# Patient Record
Sex: Male | Born: 1960
Health system: Southern US, Community
[De-identification: ages and names within clinical notes are randomized; demographics above are authoritative.]

## PROBLEM LIST (undated history)

## (undated) DIAGNOSIS — J302 Other seasonal allergic rhinitis: Secondary | ICD-10-CM

## (undated) DIAGNOSIS — G4730 Sleep apnea, unspecified: Secondary | ICD-10-CM

## (undated) DIAGNOSIS — N529 Male erectile dysfunction, unspecified: Secondary | ICD-10-CM

## (undated) DIAGNOSIS — E669 Obesity, unspecified: Secondary | ICD-10-CM

## (undated) DIAGNOSIS — J45909 Unspecified asthma, uncomplicated: Secondary | ICD-10-CM

## (undated) DIAGNOSIS — T7840XA Allergy, unspecified, initial encounter: Secondary | ICD-10-CM

## (undated) DIAGNOSIS — K7581 Nonalcoholic steatohepatitis (NASH): Secondary | ICD-10-CM

## (undated) DIAGNOSIS — K5792 Diverticulitis of intestine, part unspecified, without perforation or abscess without bleeding: Secondary | ICD-10-CM

## (undated) DIAGNOSIS — E785 Hyperlipidemia, unspecified: Secondary | ICD-10-CM

## (undated) DIAGNOSIS — I10 Essential (primary) hypertension: Secondary | ICD-10-CM

## (undated) HISTORY — DX: Diverticulitis of intestine, part unspecified, without perforation or abscess without bleeding: K57.92

## (undated) HISTORY — DX: Essential (primary) hypertension: I10

## (undated) HISTORY — DX: Nonalcoholic steatohepatitis (NASH): K75.81

## (undated) HISTORY — DX: Unspecified asthma, uncomplicated: J45.909

## (undated) HISTORY — DX: Allergy, unspecified, initial encounter: T78.40XA

## (undated) HISTORY — DX: Hyperlipidemia, unspecified: E78.5

## (undated) HISTORY — DX: Male erectile dysfunction, unspecified: N52.9

## (undated) HISTORY — DX: Obesity, unspecified: E66.9

## (undated) HISTORY — DX: Other seasonal allergic rhinitis: J30.2

## (undated) HISTORY — PX: COLONOSCOPY: SHX174

## (undated) HISTORY — DX: Sleep apnea, unspecified: G47.30

---

## 1997-07-17 HISTORY — PX: VASECTOMY: SHX75

## 1998-07-17 HISTORY — PX: HERNIA REPAIR: SHX51

## 2000-06-06 ENCOUNTER — Emergency Department (HOSPITAL_COMMUNITY): Admission: EM | Admit: 2000-06-06 | Discharge: 2000-06-07 | Payer: Self-pay | Admitting: Emergency Medicine

## 2002-05-21 ENCOUNTER — Emergency Department (HOSPITAL_COMMUNITY): Admission: EM | Admit: 2002-05-21 | Discharge: 2002-05-21 | Payer: Self-pay | Admitting: Emergency Medicine

## 2004-02-16 ENCOUNTER — Emergency Department (HOSPITAL_COMMUNITY): Admission: EM | Admit: 2004-02-16 | Discharge: 2004-02-16 | Payer: Self-pay | Admitting: Emergency Medicine

## 2004-02-19 ENCOUNTER — Emergency Department (HOSPITAL_COMMUNITY): Admission: EM | Admit: 2004-02-19 | Discharge: 2004-02-19 | Payer: Self-pay | Admitting: Emergency Medicine

## 2004-05-27 ENCOUNTER — Ambulatory Visit: Payer: Self-pay | Admitting: Family Medicine

## 2004-06-24 ENCOUNTER — Ambulatory Visit: Payer: Self-pay | Admitting: Family Medicine

## 2004-08-29 ENCOUNTER — Ambulatory Visit: Payer: Self-pay | Admitting: Family Medicine

## 2004-11-11 ENCOUNTER — Ambulatory Visit: Payer: Self-pay | Admitting: General Surgery

## 2004-12-22 ENCOUNTER — Ambulatory Visit: Payer: Self-pay | Admitting: Family Medicine

## 2005-06-26 ENCOUNTER — Ambulatory Visit: Payer: Self-pay | Admitting: Family Medicine

## 2006-01-03 ENCOUNTER — Ambulatory Visit: Payer: Self-pay | Admitting: Family Medicine

## 2006-07-04 ENCOUNTER — Ambulatory Visit: Payer: Self-pay | Admitting: Family Medicine

## 2007-01-01 ENCOUNTER — Encounter: Payer: Self-pay | Admitting: Internal Medicine

## 2007-01-01 DIAGNOSIS — E1169 Type 2 diabetes mellitus with other specified complication: Secondary | ICD-10-CM | POA: Insufficient documentation

## 2007-01-01 DIAGNOSIS — E785 Hyperlipidemia, unspecified: Secondary | ICD-10-CM | POA: Insufficient documentation

## 2007-01-01 DIAGNOSIS — I10 Essential (primary) hypertension: Secondary | ICD-10-CM | POA: Insufficient documentation

## 2007-01-01 DIAGNOSIS — K5730 Diverticulosis of large intestine without perforation or abscess without bleeding: Secondary | ICD-10-CM | POA: Insufficient documentation

## 2007-01-03 ENCOUNTER — Ambulatory Visit: Payer: Self-pay | Admitting: Family Medicine

## 2007-01-03 DIAGNOSIS — E6609 Other obesity due to excess calories: Secondary | ICD-10-CM | POA: Insufficient documentation

## 2007-01-03 DIAGNOSIS — E669 Obesity, unspecified: Secondary | ICD-10-CM

## 2007-01-03 DIAGNOSIS — E66811 Obesity, class 1: Secondary | ICD-10-CM | POA: Insufficient documentation

## 2007-01-09 LAB — CONVERTED CEMR LAB
Cholesterol: 181 mg/dL (ref 0–200)
Creatinine, Ser: 1 mg/dL (ref 0.4–1.5)
Direct LDL: 100.5 mg/dL
GFR calc non Af Amer: 86 mL/min
Triglycerides: 205 mg/dL (ref 0–149)

## 2007-03-19 ENCOUNTER — Emergency Department (HOSPITAL_COMMUNITY): Admission: EM | Admit: 2007-03-19 | Discharge: 2007-03-19 | Payer: Self-pay | Admitting: Family Medicine

## 2007-07-17 ENCOUNTER — Ambulatory Visit: Payer: Self-pay | Admitting: Family Medicine

## 2007-07-22 LAB — CONVERTED CEMR LAB
ALT: 72 units/L — ABNORMAL HIGH (ref 0–53)
AST: 45 units/L — ABNORMAL HIGH (ref 0–37)
BUN: 12 mg/dL (ref 6–23)
Bilirubin, Direct: 0.2 mg/dL (ref 0.0–0.3)
Calcium: 9.3 mg/dL (ref 8.4–10.5)
Chloride: 103 meq/L (ref 96–112)
Cholesterol: 205 mg/dL (ref 0–200)
HDL: 35.9 mg/dL — ABNORMAL LOW (ref >39.0)
Potassium: 4.3 meq/L (ref 3.5–5.1)
Total Bilirubin: 1.5 mg/dL — ABNORMAL HIGH (ref 0.3–1.2)
Total CHOL/HDL Ratio: 5.7
Triglycerides: 199 mg/dL — ABNORMAL HIGH (ref 0–149)
VLDL: 40 mg/dL (ref 0–40)

## 2007-11-11 ENCOUNTER — Ambulatory Visit: Payer: Self-pay | Admitting: Family Medicine

## 2007-11-12 LAB — CONVERTED CEMR LAB
ALT: 71 units/L — ABNORMAL HIGH (ref 0–53)
Cholesterol: 182 mg/dL (ref 0–200)
HDL: 35.2 mg/dL — ABNORMAL LOW (ref >39.0)
Total CHOL/HDL Ratio: 5.2
Triglycerides: 166 mg/dL — ABNORMAL HIGH (ref 0–149)
VLDL: 33 mg/dL (ref 0–40)

## 2008-01-15 ENCOUNTER — Ambulatory Visit: Payer: Self-pay | Admitting: Family Medicine

## 2008-04-23 ENCOUNTER — Ambulatory Visit: Payer: Self-pay | Admitting: Family Medicine

## 2008-05-04 LAB — CONVERTED CEMR LAB
ALT: 70 units/L — ABNORMAL HIGH (ref 0–53)
AST: 40 units/L — ABNORMAL HIGH (ref 0–37)
Alkaline Phosphatase: 63 units/L (ref 39–117)
Calcium: 8.8 mg/dL (ref 8.4–10.5)
Creatinine, Ser: 1.1 mg/dL (ref 0.4–1.5)
GFR calc Af Amer: 93 mL/min
GFR calc non Af Amer: 77 mL/min
Glucose, Bld: 90 mg/dL (ref 70–99)
Potassium: 3.9 meq/L (ref 3.5–5.1)

## 2008-05-26 ENCOUNTER — Ambulatory Visit: Payer: Self-pay | Admitting: Family Medicine

## 2008-05-26 DIAGNOSIS — F528 Other sexual dysfunction not due to a substance or known physiological condition: Secondary | ICD-10-CM | POA: Insufficient documentation

## 2008-05-26 DIAGNOSIS — N529 Male erectile dysfunction, unspecified: Secondary | ICD-10-CM | POA: Insufficient documentation

## 2008-06-01 ENCOUNTER — Telehealth (INDEPENDENT_AMBULATORY_CARE_PROVIDER_SITE_OTHER): Payer: Self-pay | Admitting: Internal Medicine

## 2008-07-01 ENCOUNTER — Telehealth (INDEPENDENT_AMBULATORY_CARE_PROVIDER_SITE_OTHER): Payer: Self-pay | Admitting: Internal Medicine

## 2008-10-22 ENCOUNTER — Ambulatory Visit: Payer: Self-pay | Admitting: Family Medicine

## 2008-10-23 LAB — CONVERTED CEMR LAB
AST: 28 units/L (ref 0–37)
Albumin: 4.1 g/dL (ref 3.5–5.2)
Alkaline Phosphatase: 63 units/L (ref 39–117)
Bilirubin, Direct: 0.2 mg/dL (ref 0.0–0.3)
Total Bilirubin: 1.3 mg/dL — ABNORMAL HIGH (ref 0.3–1.2)
Total Protein: 7 g/dL (ref 6.0–8.3)

## 2008-12-08 ENCOUNTER — Ambulatory Visit: Payer: Self-pay | Admitting: Family Medicine

## 2008-12-31 ENCOUNTER — Telehealth (INDEPENDENT_AMBULATORY_CARE_PROVIDER_SITE_OTHER): Payer: Self-pay | Admitting: Internal Medicine

## 2009-04-22 ENCOUNTER — Ambulatory Visit: Payer: Self-pay | Admitting: Family Medicine

## 2009-04-27 LAB — CONVERTED CEMR LAB
ALT: 42 units/L (ref 0–53)
AST: 27 units/L (ref 0–37)
Albumin: 3.8 g/dL (ref 3.5–5.2)
Total CHOL/HDL Ratio: 5
Triglycerides: 112 mg/dL (ref 0.0–149.0)
VLDL: 22.4 mg/dL (ref 0.0–40.0)

## 2009-05-14 ENCOUNTER — Ambulatory Visit: Payer: Self-pay | Admitting: Family Medicine

## 2009-05-14 ENCOUNTER — Encounter (INDEPENDENT_AMBULATORY_CARE_PROVIDER_SITE_OTHER): Payer: Self-pay | Admitting: Internal Medicine

## 2009-08-27 ENCOUNTER — Encounter: Payer: Self-pay | Admitting: Cardiovascular Disease

## 2009-08-27 ENCOUNTER — Ambulatory Visit: Payer: Self-pay | Admitting: Diagnostic Radiology

## 2009-08-27 ENCOUNTER — Emergency Department (HOSPITAL_BASED_OUTPATIENT_CLINIC_OR_DEPARTMENT_OTHER): Admission: EM | Admit: 2009-08-27 | Discharge: 2009-08-27 | Payer: Self-pay | Admitting: Emergency Medicine

## 2009-08-30 ENCOUNTER — Encounter: Payer: Self-pay | Admitting: Cardiovascular Disease

## 2009-08-31 ENCOUNTER — Ambulatory Visit: Payer: Self-pay | Admitting: Cardiovascular Disease

## 2009-11-12 ENCOUNTER — Ambulatory Visit: Payer: Self-pay | Admitting: Internal Medicine

## 2009-11-14 LAB — CONVERTED CEMR LAB
CO2: 26 meq/L (ref 19–32)
Cholesterol: 150 mg/dL (ref 0–200)
Creatinine, Ser: 0.9 mg/dL (ref 0.4–1.5)
GFR calc non Af Amer: 95.81 mL/min (ref >60)
LDL Cholesterol: 89 mg/dL (ref 0–99)
PSA: 0.64 ng/mL (ref 0.10–4.00)
Triglycerides: 155 mg/dL — ABNORMAL HIGH (ref 0.0–149.0)
VLDL: 31 mg/dL (ref 0.0–40.0)

## 2009-11-17 ENCOUNTER — Ambulatory Visit: Payer: Self-pay | Admitting: Family Medicine

## 2010-02-28 ENCOUNTER — Ambulatory Visit: Payer: Self-pay | Admitting: Cardiovascular Disease

## 2010-05-19 ENCOUNTER — Ambulatory Visit: Payer: Self-pay | Admitting: Family Medicine

## 2010-05-19 LAB — CONVERTED CEMR LAB
ALT: 75 units/L — ABNORMAL HIGH (ref 0–53)
Basophils Relative: 0.4 % (ref 0.0–3.0)
Chloride: 106 meq/L (ref 96–112)
Cholesterol: 178 mg/dL (ref 0–200)
GFR calc non Af Amer: 75.05 mL/min (ref >60)
LDL Cholesterol: 108 mg/dL — ABNORMAL HIGH (ref 0–99)
Lymphocytes Relative: 28.9 % (ref 12.0–46.0)
Lymphs Abs: 1.9 10*3/uL (ref 0.7–4.0)
MCHC: 34.4 g/dL (ref 30.0–36.0)
MCV: 89 fL (ref 78.0–100.0)
Monocytes Relative: 8.7 % (ref 3.0–12.0)
Neutrophils Relative %: 60.9 % (ref 43.0–77.0)
Platelets: 201 10*3/uL (ref 150.0–400.0)
Potassium: 4 meq/L (ref 3.5–5.1)
RDW: 13.1 % (ref 11.5–14.6)
Sodium: 140 meq/L (ref 135–145)
TSH: 2.04 microintl units/mL (ref 0.35–5.50)
Total Bilirubin: 0.9 mg/dL (ref 0.3–1.2)
Triglycerides: 169 mg/dL — ABNORMAL HIGH (ref 0.0–149.0)
WBC: 6.5 10*3/uL (ref 4.5–10.5)

## 2010-05-23 ENCOUNTER — Ambulatory Visit: Payer: Self-pay | Admitting: Family Medicine

## 2010-05-23 DIAGNOSIS — R7401 Elevation of levels of liver transaminase levels: Secondary | ICD-10-CM | POA: Insufficient documentation

## 2010-05-23 DIAGNOSIS — R7402 Elevation of levels of lactic acid dehydrogenase (LDH): Secondary | ICD-10-CM | POA: Insufficient documentation

## 2010-05-23 DIAGNOSIS — R740 Nonspecific elevation of levels of transaminase and lactic acid dehydrogenase [LDH]: Secondary | ICD-10-CM

## 2010-08-16 NOTE — Assessment & Plan Note (Signed)
Summary: [facility]/NP6   Visit Type:  New Patient Referring Provider:  Wyatt Mage, NP Primary Provider:  Wyatt Mage, NP  CC:  some chest pressure - ?heartburn this past friday..  No sob or edema...  History of Present Illness: Terry Dixon is a 50 year old gentleman with a history of low HDL, obesity, hypertension who presents for evaluation after recent episode of chest pain and an abnormal EKG.  Terry Dixon states that on Friday morning last week he had a sloppy Joe for breakfast at approximately 5 or 6 in the morning. He developed chest pain that lasted approximately 5-6 hours through the course of the morning. After several hours of chest discomfort, he presented to his boss at work and told him about his discomfort. He was referred for an EKG and as this was abnormal was sent to [facility]. There he received an aspirin, was monitored on telemetry 4 hours and after contact was made with our cardiology, followup was made in clinic.  Since his discharge from the emergency room, his had no further episodes of chest pain. 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 He denies shortness of breath, lightheadedness, dizziness.  He is concerned about his blood pressure and takes Prempro in the morning. Part to taking his medication, he states that his blood pressure has been in the 150s over 90s. He has also been having problems with erectile dysfunction and takes Viagra or cialis.   Preventive Screening-Counseling & Management  Alcohol-Tobacco     Alcohol drinks/day: <1     Smoking Status: quit     Packs/Day: 1.0     Year Started: 1985  Caffeine-Diet-Exercise     Caffeine use/day: 2 cups     Does Patient Exercise: no  Current Problems (verified): 1)  Pure Hypercholesterolemia  (ICD-272.0) 2)  Erectile Dysfunction  (ICD-302.72) 3)  Numbness  (ICD-782.0) 4)  Obesity  (ICD-278.00) 5)  Hypertension  (ICD-401.9) 6)   Hyperlipidemia  (ICD-272.4) 7)  Diverticulosis, Colon  (ICD-562.10)  Current Medications (verified): 1)  Altace 10 Mg Caps (Ramipril) .... Take 1 Tablet By Mouth Once A Day 2)  Fish Oil   Caps (Omega-3 Fatty Acids Caps) .Marland Kitchen.. 1200 Mg. Once Daily 3)  One-Daily Multivitamins   Tabs (Multiple Vitamin) .... Daily 4)  Levitra 20 Mg Tabs (Vardenafil Hcl) .Marland Kitchen.. 1 Once Daily As Needed By Mouth 5)  Viagra 100 Mg Tabs (Sildenafil Citrate) .... As Directed  Allergies (verified): No Known Drug Allergies  Past History:  Past Medical History: Last updated: 01/01/2007 Diverticulosis, colon Hyperlipidemia Hypertension  Past Surgical History: Last updated: 01/01/2007 1999 Vasectomy 2000 Hernia repair R & L 4/06  CT abd/pelvis (-) 10/05 Stress test (-)  Family History: Last updated: 01/15/2008 Father: alive 88 HBP, BPH Mother: Alive 28 DM, HBP Siblings: No brothers, 2 sisters 1 with "male problems",                                                     1 L&W HBP:  M side CA:  colon polyps  Social History: Last updated: 05/14/2009 Marital Status:Divorced x 4 years--04/2009 engaged x 6 mo Children: 1 (36), lives with father 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 Former Smoker, age 8 x 1 year Alcohol use-yes 2-3 x's/week Drug use-no Regular exercise-yes, Gym 2 x/week; Mt. biking 1 x/week, aerobics 1 hr.         2 x/week  Risk Factors: Alcohol Use: <1 (08/31/2009) Caffeine Use: 2 cups (08/31/2009) Exercise: no (08/31/2009)  Risk Factors: Smoking Status: quit (08/31/2009) Packs/Day: 1.0 (08/31/2009) Passive Smoke Exposure: no (07/17/2007)  Social History: Smoking Status:  quit Packs/Day:  1.0 Caffeine use/day:  2 cups  Review of Systems       The patient complains of chest pain.  The patient denies anorexia, fever, weight loss, weight gain, vision loss, decreased hearing, hoarseness, syncope, dyspnea on exertion, peripheral  edema, prolonged cough, headaches, hemoptysis, abdominal pain, melena, hematochezia, severe indigestion/heartburn, hematuria, incontinence, genital sores, muscle weakness, suspicious skin lesions, transient blindness, difficulty walking, depression, unusual weight change, abnormal bleeding, enlarged lymph nodes, angioedema, breast masses, and testicular masses.    Vital Signs:  Patient profile:   50 year old male Height:      68.25 inches Weight:      201.50 pounds BMI:     30.52 Pulse rate:   63 / minute Pulse rhythm:   regular BP sitting:   118 / 76  (left arm) Cuff size:   large  Vitals Entered By: Philemon Kingdom (August 31, 2009 3:06 PM)  Physical Exam  General:  well-appearing middle-aged gentleman in no apparent distress, HEENT exam is benign. He is alert and oriented x3. Neck is supple with no JVP or carotid bruits. Heart sounds are regular with normal S1 and S2 and no murmurs appreciated. Lungs are clear to auscultation with no wheezes or rales. Abdominal exam is benign and he has no significant lower extremity edema. Neurologic exam is nonfocal and skin is warm and dry. Pulses are equal and symmetrical in his upper and lower extremities.    EKG  Procedure date:  08/31/2009  Findings:      EKG shows normal sinus rhythm with rate of 59 beats per minute, right bundle branch block.  Impression & Recommendations:  Problem # 1:  CHEST PAIN-UNSPECIFIED (ICD-786.50) Etiology of his chest pain is uncertain. I suspect that it may have been GI in nature as he had just had a sloppy Joe for breakfast. His symptoms are consistent with heartburn/GERD. I have suggested to him that if he continues to have any more episodes of chest discomfort, that he call us. We would perform a stress test at that time. He does have a bundle branch block on EKG today but this by itself has not needed any workup if he is asymptomatic.I have discussed this with him and he will contact us for additional  episodes of chest discomfort.  Problem # 2:  HYPERTENSION (ICD-401.9) Mr. Terry Dixon has hypertension but does not appear to be well controlled on his ReoPro as he states having systolics typically in the 161W with diastolics in the 96E. He also reports having erectile dysfunction. I will change his ACE inhibitor which may contribute to his ED, 2 losartan 100 mg daily. I asked him to continue to monitor his blood pressures for a period His updated medication list for this problem includes:     Cozaar 100 Mg Tabs (Losartan potassium) .Marland Kitchen... 1 tab by mouth daily  Problem # 3:  OBESITY (ICD-278.00) We talked about his weight and is well aware that he needs to eat less and exercise more. Last 10 to start a more rigorous diet plan and increases  exercise if possible but he does have a busy work schedule.  Problem # 4:  HYPERLIPIDEMIA (ICD-272.4) in October 2010, total cholesterol 141, LDL 90 and HDL 29. We suggested the exercise would be one way to increase his HDL. He is noteager to start an additional medication such as Niaspan at this time. Cholesterol can be rechecked on an annual basis.  Patient Instructions: 1)  Your physician recommends that you schedule a follow-up appointment in: 6 months 2)  Your physician recommends that you continue on your current medications as directed. Please refer to the Current Medication list given to you today. Once you are finished with your ramipril, start cozaar 184m daily. Prescriptions: COZAAR 100 MG TABS (LOSARTAN POTASSIUM) 1 tab by mouth daily  #30 x 6   Entered by:   MGabriel Cirri RN, BSN   Authorized by:   TEsmond PlantsMD   Signed by:   MGabriel Cirri RN, BSN on 08/31/2009   Method used:   Electronically to        [facility]  [address] (retail)       [address]      [place]  [id number]      Ph: [phone]      Fax: [phone]  RxID:   [id number]

## 2010-08-16 NOTE — Assessment & Plan Note (Signed)
Summary: F6M/AMD   Visit Type:  Follow-up Referring Provider:  Wyatt Mage, Terry Dixon Primary Provider:  Wyatt Mage, Terry Dixon  CC:  "Doing well.".  History of Present Illness: Terry Dixon is a 50 year old gentleman with a history of low HDL, obesity, hypertension who had epsiodes of  chest pain and an abnormal EKG on his last visit who presents for routine follow up. He was seen previously in the emergency room.  overall, he states that he is doing well. He denies any further episodes of chest discomfort, shortness of breath. He is active at work and has no symptoms of lightheadedness or dizziness. he does not participate in an exercise program and reports that he walks a significant amount at work. He has been taking fish oil.  Recent lipid panel shows total cholesterol of 150, LDL greater than 80.    Current Medications (verified): 1)  Cozaar 100 Mg Tabs (Losartan Potassium) .Marland Kitchen.. 1 Tab By Mouth Daily 2)  Fish Oil   Caps (Omega-3 Fatty Acids Caps) .Marland Kitchen.. 1200 Mg. Once Daily 3)  One-Daily Multivitamins   Tabs (Multiple Vitamin) .... Daily 4)  Levitra 20 Mg Tabs (Vardenafil Hcl) .Marland Kitchen.. 1 Once Daily As Needed By Mouth 5)  Viagra 100 Mg Tabs (Sildenafil Citrate) .... As Directed 6)  Androgel Pump 1 % Gel (Testosterone) .... As Directed  Allergies (verified): No Known Drug Allergies  Past History:  Past Medical History: Last updated: 11/17/2009 Diverticulosis, colon Hyperlipidemia- with low HDL obesity  Hypertension ED low testosterone   [facility]rol- [facility]  Past Surgical History: Last updated: 01/01/2007 1999 Vasectomy 2000 Hernia repair R & L 4/06  CT abd/pelvis (-) 10/05 Stress test (-)  Family History: Last updated: 01/15/2008 Father: alive 61 HBP, BPH Mother: Alive 32 DM, HBP Siblings: No brothers, 2 sisters 1 with "male problems",                                                     1 L&W HBP:  M side CA:  colon polyps  Social History: Last updated:  05/14/2009 Marital Status:Divorced x 4 years--04/2009 engaged x 6 mo Children: 1 (66), lives with father 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 Former Smoker, age 88 x 1 year Alcohol use-yes 2-3 x's/week Drug use-no Regular exercise-yes, Gym 2 x/week; Mt. biking 1 x/week, aerobics 1 hr.         2 x/week  Risk Factors: Alcohol Use: <1 (08/31/2009) Caffeine Use: 2 cups (08/31/2009) Exercise: no (08/31/2009)  Risk Factors: Smoking Status: quit (08/31/2009) Packs/Day: 1.0 (08/31/2009) Passive Smoke Exposure: no (07/17/2007)  Review of Systems  The patient denies fever, weight loss, weight gain, vision loss, decreased hearing, hoarseness, chest pain, syncope, dyspnea on exertion, peripheral edema, prolonged cough, abdominal pain, incontinence, muscle weakness, depression, and enlarged lymph nodes.    Vital Signs:  Patient profile:   50 year old male Height:      68.25 inches Weight:      206 pounds BMI:     31.21 Pulse rate:   56 / minute BP sitting:   130 / 81  (left arm) Cuff size:   regular  Vitals Entered By: Terry Dixon, CMA (February 28, 2010 10:10 AM)  Physical Exam  General:  overweight but generally well appearing  Head:  normocephalic, atraumatic, and no abnormalities observed.   Neck:  supple with full rom and no masses or thyromegally, no JVD or carotid bruit  Lungs:  Normal respiratory effort, chest expands symmetrically. Lungs are clear to auscultation, no crackles or wheezes. Heart:  normal rate, regular rhythm, and no murmur.   Abdomen:  Bowel sounds positive,abdomen soft and non-tender without masses,  Msk:  Back normal, normal gait. Muscle strength and tone normal. Pulses:  pulses normal in all 4 extremities Extremities:  No clubbing or cyanosis. Neurologic:  Alert and oriented x 3. Skin:  Intact without lesions or rashes. Psych:  Normal affect.   Impression & Recommendations:  Problem # 1:   HYPERTENSION (ICD-401.9) blood pressure is well controlled on his current medication regimen. We have made no changes.  His updated medication list for this problem includes:    Cozaar 100 Mg Tabs (Losartan potassium) .Marland Kitchen... 1 tab by mouth daily  Problem # 2:  HYPERLIPIDEMIA (ICD-272.4)  Cholesterol is reasonably well controlled given that he has no underlying coronary artery disease that we know of. No significant family history of coronary artery disease. We will not start him on a statin at this time.  Problem # 3:  OBESITY (ICD-278.00) we have asked him to work on his weight, watch his diet, increase his regular exercise. He can followup on an as needed basis and call us if he has additional episodes of chest pain.  Patient Instructions: 1)  Your physician recommends that you schedule a follow-up appointment in: as needed  2)  Your physician recommends that you continue on your current medications as directed. Please refer to the Current Medication list given to you today.

## 2010-08-16 NOTE — Letter (Signed)
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 [place], [place] [address]   Phone: [phone]  Fax: [phone]     August 31, 2009    Terry Dixon   The above named patient had a medical visit today at:    2:45  pm.  Please take this into consideration when reviewing the time away from work/school.      Sincerely yours,       Press photographer

## 2010-08-16 NOTE — Assessment & Plan Note (Signed)
Summary: 6 m f/u dlo   Vital Signs:  Patient profile:   50 year old male Height:      68.25 inches Weight:      204 pounds BMI:     30.90 Temp:     98 degrees F oral Pulse rate:   60 / minute Pulse rhythm:   regular BP sitting:   116 / 72  (left arm) Cuff size:   large  Vitals Entered By: Ozzie Hoyle LPN (Nov 18, 6267 4:85 AM) CC: six month f/u after labs   History of Present Illness: was seeing Billie Bean   hx of HTN well on cozaar -fine , no problems   hx of high chol-- more of a low HDL  disc with cardiology  used to be a bigger drinker - now has one drink per day  this past HDL was 30  is not exercising  would like to start  will have to start in am   is interested in seeing what thyroid tests show  sees urol - at [facility] every 6 mo  psa normal  has ED , and low testosterone  no prostate problems  is due to f/u soon -- they do his yearly prostate exam   diet is healthy and does not think he eats too much- in fact does not eat enough   Allergies (verified): No Known Drug Allergies  Past History:  Past Surgical History: Last updated: 01/01/2007 1999 Vasectomy 2000 Hernia repair R & L 4/06  CT abd/pelvis (-) 10/05 Stress test (-)  Family History: Last updated: 01/15/2008 Father: alive 7 HBP, BPH Mother: Alive 5 DM, HBP Siblings: No brothers, 2 sisters 1 with "male problems",                                                     1 L&W HBP:  M side CA:  colon polyps  Social History: Last updated: 05/14/2009 Marital Status:Divorced x 4 years--04/2009 engaged x 6 mo Children: 1 (60), lives with father 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 Former Smoker, age 50 x 1 year Alcohol use-yes 2-3 x's/week Drug use-no Regular exercise-yes, Gym 2 x/week; Mt. biking 1 x/week, aerobics 1 hr.         2 x/week  Risk Factors: Alcohol Use: <1 (08/31/2009) Caffeine Use: 2 cups (08/31/2009) Exercise:  no (08/31/2009)  Risk Factors: Smoking Status: quit (08/31/2009) Packs/Day: 1.0 (08/31/2009) Passive Smoke Exposure: no (07/17/2007)  Past Medical History: Diverticulosis, colon Hyperlipidemia- with low HDL obesity  Hypertension ED low testosterone   [facility]rol- [facility]  Review of Systems General:  Denies fatigue, loss of appetite, and malaise. Eyes:  Denies blurring and eye pain. CV:  Denies chest pain or discomfort, palpitations, shortness of breath with exertion, and swelling of feet. Resp:  Denies cough and wheezing. GI:  Denies abdominal pain, bloody stools, change in bowel habits, indigestion, and nausea. GU:  Complains of erectile dysfunction; denies incontinence, nocturia, and urinary frequency. MS:  Denies joint pain, joint redness, joint swelling, and muscle aches. Derm:  Denies itching, lesion(s), poor wound healing, and rash. Neuro:  Denies numbness and tingling. Psych:  Denies anxiety and depression. Endo:  Denies cold intolerance, excessive thirst, excessive urination, and heat intolerance.  Heme:  Denies abnormal bruising and bleeding.  Physical Exam  General:  overweight but generally well appearing  Head:  normocephalic, atraumatic, and no abnormalities observed.   Eyes:  vision grossly intact, pupils equal, pupils round, and pupils reactive to light.  no conjunctival pallor, injection or icterus  Mouth:  pharynx pink and moist.   Neck:  supple with full rom and no masses or thyromegally, no JVD or carotid bruit  Chest Wall:  No deformities, masses, tenderness or gynecomastia noted. Lungs:  Normal respiratory effort, chest expands symmetrically. Lungs are clear to auscultation, no crackles or wheezes. Heart:  normal rate, regular rhythm, and no murmur.   Abdomen:  Bowel sounds positive,abdomen soft and non-tender without masses, organomegaly or hernias noted. no renal bruits protuberant small umbilical hernia  Msk:  No deformity or scoliosis noted of  thoracic or lumbar spine.  no acute joint changes  Pulses:  R and L carotid,radial,femoral,dorsalis pedis and posterior tibial pulses are full and equal bilaterally Extremities:  No clubbing, cyanosis, edema, or deformity noted with normal full range of motion of all joints.   Neurologic:  sensation intact to light touch, gait normal, and DTRs symmetrical and normal.   Skin:  Intact without suspicious lesions or rashes lentigos diffusely  Cervical Nodes:  No lymphadenopathy noted Inguinal Nodes:  No significant adenopathy Psych:  normal affect, talkative and pleasant    Impression & Recommendations:  Problem # 1:  SPECIAL SCREENING MALIGNANT NEOPLASM OF PROSTATE (ICD-V76.44) Assessment Unchanged psa was normal - with no hx of problems- will send along to his urologist (in fact- pt will take his own copy) rev labs with him  Problem # 2:  PURE HYPERCHOLESTEROLEMIA (ICD-272.0) Assessment: Unchanged with low hdl- rev labs in detail with pt  recommended exercise continue omega fish oil if not better in 6 mo - consider niaspan   Problem # 3:  HYPERTENSION (ICD-401.9) Assessment: Unchanged  good control with cozaar and healthy diet asked to add exercise and wt loss plan  lab and PE planned for 6 mo  His updated medication list for this problem includes:    Cozaar 100 Mg Tabs (Losartan potassium) .Marland Kitchen... 1 tab by mouth daily  BP today: 116/72 Prior BP: 118/76 (08/31/2009)  Labs Reviewed: K+: 3.9 (11/12/2009) Creat: : 0.9 (11/12/2009)   Chol: 150 (11/12/2009)   HDL: 30.00 (11/12/2009)   LDL: 89 (11/12/2009)   TG: 155.0 (11/12/2009)  Problem # 4:  ERECTILE DYSFUNCTION (ICD-302.72) Assessment: Unchanged will f/u for this and testosterone def with his urologist  would like note sent when he goes  His updated medication list for this problem includes:    Levitra 20 Mg Tabs (Vardenafil hcl) .Marland Kitchen... 1 once daily as needed by mouth    Viagra 100 Mg Tabs (Sildenafil citrate) .Marland Kitchen... As  directed  Complete Medication List: 1)  Cozaar 100 Mg Tabs (Losartan potassium) .Marland Kitchen.. 1 tab by mouth daily 2)  Fish Oil Caps (Omega-3 fatty acids caps) .Marland Kitchen.. 1200 mg. once daily 3)  One-daily Multivitamins Tabs (Multiple vitamin) .... Daily 4)  Levitra 20 Mg Tabs (Vardenafil hcl) .Marland Kitchen.. 1 once daily as needed by mouth 5)  Viagra 100 Mg Tabs (Sildenafil citrate) .... As directed 6)  Androgel Pump 1 % Gel (Testosterone) .... As directed   Patient Instructions: 1)  start exercising in am from 4:30 - 5 -- walking in good weather or exercise with TV video in bad weather  2)  this will help the HDL  3)  continue  the fish oil  4)  schedule fasting labs and then PE in 6 months -- wellness/ lipids v70.0, 272   Current Allergies (reviewed today): No known allergies

## 2010-08-16 NOTE — Progress Notes (Signed)
Summary: PHI  PHI   Imported By: Zenovia Jarred 09/01/2009 14:23:38  _____________________________________________________________________  External Attachment:    Type:   Image     Comment:   External Document

## 2010-08-16 NOTE — Assessment & Plan Note (Signed)
Summary: CPX/RBH   Vital Signs:  Patient profile:   50 year old male Height:      68.25 inches Weight:      208.25 pounds BMI:     31.55 Temp:     98.2 degrees F oral Pulse rate:   60 / minute Pulse rhythm:   regular BP sitting:   144 / 84  (left arm) Cuff size:   regular  Vitals Entered By: Ozzie Hoyle LPN (May 23, 3418 9:05 AM)  Serial Vital Signs/Assessments:  Time      Position  BP       Pulse  Resp  Temp     By                     130/82                         Allena Earing MD  CC: CPX   History of Present Illness: here for wellness exam and to disc chronic med problems   feeling ok in general  L hip bothering him for 4 months -- worse after driving and he tries to get out of truck  eases after waking again  is in groin area   no regular exercise  is active job   is on his feet 8 of his 12 hour shift  does climb a lot of stairs   lipids are up a bit with trig 169 and HDL 36 and LDL 108 (was in 80s) also ast /alt are slt elevated at 43 and 75-- these have been up and down in past  does not take any type of otc pain meds  alcohol intake one drink per month (used to drink more)  drinks a lot of mt dew -- one to four per day  drinks a lot of water at work   HTN has been controlled  144/84 today-- will re check   wt is up 2 lb diet is average -- tries to watch what he eats  does stay away from the fatty and fried food - eats fast food once per week-- burger and fries    last psa nl in april saw urologist about testosterone and ED sees urol on 22 -- nothing new  levitra is working well for him  testosterone is helping some - his levels are nl   Tdap 10/10 flu shot   Allergies (verified): No Known Drug Allergies  Past History:  Past Medical History: Last updated: 11/17/2009 Diverticulosis, colon Hyperlipidemia- with low HDL obesity  Hypertension ED low testosterone   urol- [facility]  Past Surgical History: Last updated:  01/01/2007 1999 Vasectomy 2000 Hernia repair R & L 4/06  CT abd/pelvis (-) 10/05 Stress test (-)  Family History: Last updated: 01/15/2008 Father: alive 32 HBP, BPH Mother: Alive 3 DM, HBP Siblings: No brothers, 2 sisters 1 with "male problems",                                                     1 L&W HBP:  M side CA:  colon polyps  Social History: Last updated: 05/23/2010 Marital Status:Divorced x 4 years--04/2009 engaged x 6 mo Children: 1 (42), lives with father 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 Former Smoker, age 69 x 1 year Alcohol use-yes - 1 drink per mo (in past more )  Drug use-no Regular exercise-yes, Gym 2 x/week; Mt. biking 1 x/week, aerobics 1 hr.         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 Former Smoker, age 69 x 1 year Alcohol use-yes - 1 drink per mo (in past more )  Drug use-no Regular exercise-yes, Gym 2 x/week; Mt. biking 1 x/week, aerobics 1 hr.         2 x/week  Review of Systems General:  Denies fatigue, loss of appetite, and malaise. Eyes:  Denies blurring and eye irritation. CV:  Denies chest pain or discomfort, lightheadness, and palpitations. Resp:  Denies cough, shortness of breath, and wheezing. GI:  Denies abdominal pain, change in bowel habits, indigestion, and nausea. GU:  Complains of erectile dysfunction; denies hematuria and urinary frequency. MS:  Denies joint pain, joint redness, and joint swelling. Derm:  Denies itching, lesion(s), poor wound healing, and rash. Neuro:  Denies  numbness and tingling. Psych:  Denies anxiety and depression. Endo:  Denies cold intolerance, excessive thirst, excessive urination, and heat intolerance. Heme:  Denies abnormal bruising and bleeding.  Physical Exam  General:  overweight but generally well appearing  Head:  normocephalic, atraumatic, no abnormalities observed, and no abnormalities palpated.  no conjunctival pallor, injection or icterus  Eyes:  vision grossly intact, pupils equal, pupils round, and pupils reactive to light.  no conjunctival pallor, injection or icterus  Ears:  R ear normal and L ear normal.  - scant cerumen  Nose:  no nasal discharge.   Mouth:  pharynx pink and moist.   Neck:  supple with full rom and no masses or thyromegally, no JVD or carotid bruit  Chest Wall:  No deformities, masses, tenderness or gynecomastia noted. Lungs:  Normal respiratory effort, chest expands symmetrically. Lungs are clear to auscultation, no crackles or wheezes. Heart:  normal rate, regular rhythm, and no murmur.   Abdomen:  Bowel sounds positive,abdomen soft and non-tender without masses, organomegaly or hernias noted. no renal bruits protuberant small umbilical hernia  Rectal:  not done Msk:  No deformity or scoliosis noted of thoracic or lumbar spine.  no acute joint changes  Pulses:  R and L carotid,radial,femoral,dorsalis pedis and posterior tibial pulses are full and equal bilaterally Extremities:  No clubbing, cyanosis, edema, or deformity noted with normal full range of motion of all joints.   Neurologic:  sensation intact to light touch, gait normal, and DTRs symmetrical and normal.   Skin:  Intact without suspicious lesions or rashes Cervical Nodes:  No lymphadenopathy noted Inguinal Nodes:  No significant adenopathy Psych:  normal affect, talkative and pleasant    Impression & Recommendations:  Problem # 1:  HEALTH MAINTENANCE EXAM (ICD-V70.0) Assessment Comment Only reviewed health habits including diet,  exercise and skin cancer prevention reviewed health maintenance list and family history will f/u with urol for prostate exam next wk  rev lab in detail  disc imp ov wt loss  Problem # 2:  PURE  HYPERCHOLESTEROLEMIA (ICD-272.0) Assessment: Deteriorated  rev labs need to inc hdl-- given info  disc exercise  re check 6 mo and f/u  Labs Reviewed: SGOT: 43 (05/19/2010)   SGPT: 75 (05/19/2010)   HDL:36.10 (05/19/2010), 30.00 (11/12/2009)  LDL:108 (05/19/2010), 89 (11/12/2009)  Chol:178 (05/19/2010), 150 (11/12/2009)  Trig:169.0 (05/19/2010), 155.0 (11/12/2009)  Problem # 3:  HYPERTENSION (ICD-401.9) Assessment: Unchanged  this is well controlled on cozaar without change rev labs  plan made for wt loss His updated medication list for this problem includes:    Cozaar 100 Mg Tabs (Losartan potassium) .Marland Kitchen... 1 tab by mouth daily  BP today: 144/84- re check 130/82 at rest  Prior BP: 130/81 (02/28/2010)  Labs Reviewed: K+: 4.0 (05/19/2010) Creat: : 1.1 (05/19/2010)   Chol: 178 (05/19/2010)   HDL: 36.10 (05/19/2010)   LDL: 108 (05/19/2010)   TG: 169.0 (05/19/2010)  Problem # 4:  TRANSAMINASES, SERUM, ELEVATED (ICD-790.4) mild / intermittent and most likely from fatty liver  will work on wt loss- made plan  f/u 6 mo after labs  Complete Medication List: 1)  Cozaar 100 Mg Tabs (Losartan potassium) .Marland Kitchen.. 1 tab by mouth daily 2)  Fish Oil Caps (Omega-3 fatty acids caps) .Marland Kitchen.. 1200 mg. once daily 3)  One-daily Multivitamins Tabs (Multiple vitamin) .... Daily 4)  Levitra 20 Mg Tabs (Vardenafil hcl) .Marland Kitchen.. 1 once daily as needed by mouth 5)  Viagra 100 Mg Tabs (Sildenafil citrate) .... As directed 6)  Androgel Pump 1 % Gel (Testosterone) .... As directed  Other Orders: Admin 1st Vaccine [phone]) Flu Vaccine 5yr + (([id number]  Patient Instructions: 1)  It is important that you exercise reguarly at least 20 minutes 5 times a week. If you develop chest pain, have severe difficulty breathing, or  feel very tired, stop exercising immediately and seek medical attention.  2)  try to go back to the good routine with fruits and vegetables  3)  work on 1 lb per week weight loss 4)  no change in medicine  5)  schedule fasting lab and then follow up in 6 month lipid/hepatic 272   Orders Added: 1)  Admin 1st Vaccine [[id number]] 2)  Flu Vaccine 315yr+ [90658] 3)  Est. Patient 40-64 years [[id number]]  Current Allergies (reviewed today): No known allergies   Flu Vaccine Consent Questions     Do you have a history of severe allergic reactions to this vaccine? no    Any prior history of allergic reactions to egg and/or gelatin? no    Do you have a sensitivity to the preservative Thimersol? no    Do you have a past history of Guillan-Barre Syndrome? no    Do you currently have an acute febrile illness? no    Have you ever had a severe reaction to latex? no    Vaccine information given and explained to patient? yes    Are you currently pregnant? no    Lot Number:[id number]   Exp Date:01/14/2011   Site Given  Left Deltoid IM ReOzzie HoylePN  November  7, 209147:55 AM  .lbElana Alm

## 2010-09-24 ENCOUNTER — Encounter: Payer: Self-pay | Admitting: Family Medicine

## 2010-10-06 ENCOUNTER — Other Ambulatory Visit: Payer: Self-pay | Admitting: Emergency Medicine

## 2010-10-06 LAB — COMPREHENSIVE METABOLIC PANEL
ALT: 69 U/L — ABNORMAL HIGH (ref 0–53)
AST: 37 U/L (ref 0–37)
Alkaline Phosphatase: 71 U/L (ref 39–117)
CO2: 27 mEq/L (ref 19–32)
GFR calc non Af Amer: >60 mL/min (ref >60)
Total Protein: 7.6 g/dL (ref 6.0–8.3)

## 2010-10-06 LAB — POCT CARDIAC MARKERS
CKMB, poc: <1 ng/mL — ABNORMAL LOW (ref 1.0–8.0)
Myoglobin, poc: 44.7 ng/mL (ref 12–200)
Myoglobin, poc: 55.4 ng/mL (ref 12–200)
Troponin i, poc: <0.05 ng/mL (ref 0.00–0.09)

## 2010-10-06 LAB — CBC
Hemoglobin: 16.3 g/dL (ref 13.0–17.0)
MCHC: 34.8 g/dL (ref 30.0–36.0)
MCV: 87.8 fL (ref 78.0–100.0)
Platelets: 217 10*3/uL (ref 150–400)
RBC: 5.33 MIL/uL (ref 4.22–5.81)
RDW: 12.1 % (ref 11.5–15.5)
WBC: 8 10*3/uL (ref 4.0–10.5)

## 2010-10-06 LAB — DIFFERENTIAL
Lymphocytes Relative: 19 % (ref 12–46)
Lymphs Abs: 1.6 10*3/uL (ref 0.7–4.0)
Monocytes Absolute: 0.5 10*3/uL (ref 0.1–1.0)
Monocytes Relative: 7 % (ref 3–12)
Neutro Abs: 5.8 10*3/uL (ref 1.7–7.7)
Neutrophils Relative %: 72 % (ref 43–77)

## 2010-10-06 MED ORDER — LOSARTAN POTASSIUM 100 MG PO TABS: 100.0000 mg | ORAL_TABLET | Freq: Every day | ORAL | Status: DC

## 2010-10-06 NOTE — Telephone Encounter (Signed)
rx sent into pharmacy/sab

## 2010-11-14 ENCOUNTER — Other Ambulatory Visit: Payer: Self-pay | Admitting: Family Medicine

## 2010-11-14 DIAGNOSIS — E785 Hyperlipidemia, unspecified: Secondary | ICD-10-CM

## 2010-11-17 ENCOUNTER — Other Ambulatory Visit (INDEPENDENT_AMBULATORY_CARE_PROVIDER_SITE_OTHER): Payer: BC Managed Care – PPO

## 2010-11-17 DIAGNOSIS — E785 Hyperlipidemia, unspecified: Secondary | ICD-10-CM

## 2010-11-17 LAB — HEPATIC FUNCTION PANEL
ALT: 87 U/L — ABNORMAL HIGH (ref 0–53)
AST: 50 U/L — ABNORMAL HIGH (ref 0–37)
Alkaline Phosphatase: 63 U/L (ref 39–117)
Bilirubin, Direct: 0.1 mg/dL (ref 0.0–0.3)
Total Bilirubin: 1.3 mg/dL — ABNORMAL HIGH (ref 0.3–1.2)

## 2010-11-17 LAB — LDL CHOLESTEROL, DIRECT: Direct LDL: 115.6 mg/dL

## 2010-11-17 LAB — LIPID PANEL
Cholesterol: 182 mg/dL (ref 0–200)
Total CHOL/HDL Ratio: 5
Triglycerides: 201 mg/dL — ABNORMAL HIGH (ref 0.0–149.0)
VLDL: 40.2 mg/dL — ABNORMAL HIGH (ref 0.0–40.0)

## 2010-11-21 ENCOUNTER — Encounter: Payer: Self-pay | Admitting: Family Medicine

## 2010-11-21 ENCOUNTER — Ambulatory Visit (INDEPENDENT_AMBULATORY_CARE_PROVIDER_SITE_OTHER): Payer: BC Managed Care – PPO | Admitting: Family Medicine

## 2010-11-21 VITALS — BP 124/76 | HR 60 | Temp 97.9°F | Ht 68.25 in | Wt 207.8 lb

## 2010-11-21 DIAGNOSIS — E785 Hyperlipidemia, unspecified: Secondary | ICD-10-CM

## 2010-11-21 DIAGNOSIS — E669 Obesity, unspecified: Secondary | ICD-10-CM

## 2010-11-21 DIAGNOSIS — R7401 Elevation of levels of liver transaminase levels: Secondary | ICD-10-CM

## 2010-11-21 DIAGNOSIS — I10 Essential (primary) hypertension: Secondary | ICD-10-CM

## 2010-11-21 NOTE — Assessment & Plan Note (Signed)
This is slt imp with LDL but up on trig  Disc low sat fat diet in detail Will continue to monitor  Wt loss is also a goal

## 2010-11-21 NOTE — Assessment & Plan Note (Signed)
Given hx strongly suspect fatty liver Check [place] abd to r/o other processes  No symptoms  Will avoid tylenol and alcohol  Work hard on wt loss F/u 6 wk after [place]

## 2010-11-21 NOTE — Progress Notes (Signed)
Subjective:    Patient ID: Charlann Noss, male    DOB: 05/27/1961, 50 y.o.   MRN: [id number]  HPI Here for f/u of lipids and HTN and elevated LFTs Is feeling fine overall Nothing new going on   HTN in good control with 124/76 No cp or sob or edema  Tolerates cozaar well   Lipids are slt improved with hdl of 36 and LDL 115 Trig are up  Diet is pretty good but does not exercise -- cannot get to the gym -- time wise with scattered schedule  Is avoiding fatty foods    Wt is stable- obese  LFT slt worse with 1.3 bili and ast 50 alt 87-- this is up Never done [place] of liver   Tylenol - does not take   Supplements-- mvi and fish oil  Alcohol --not much  1 beer per week  Weekend before this did drink 12 beers on fishing trip   No abd pain  Never had gallstone problems  No hx of hepatitis  No blood transfusions   Was told he had fatty liver in the past   Past Medical History  Diagnosis Date  . Diverticulitis     colon   . Hyperlipidemia   . Obesity   . Hypertension   . ED (erectile dysfunction)   . Low testosterone     History   Social History  . Marital Status: Divorced    Spouse Name: N/A    Number of Children: 1  . Years of Education: N/A   Occupational History  . [employer] (electrician)    Social History Main Topics  . Smoking status: Never Smoker   . Smokeless tobacco: Not on file   Comment: age 28 for 1 year   . Alcohol Use: Yes     1 drink per month   . Drug Use: No  . Sexually Active: Not on file   Other Topics Concern  . Not on file   Social History Narrative   Regular exercise- yes, gym 2 x week, mnt. Biking 1 x week, aerobics 1 hr 2 x week.     Family History  Problem Relation Age of Onset  . Diabetes Mother   . Benign prostatic hyperplasia Father   . Hypertension Father       Review of Systems Review of Systems  Constitutional: Negative for fever, appetite change, fatigue and unexpected weight change.  Eyes: Negative for pain  and visual disturbance.  Respiratory: Negative for cough and shortness of breath.   Cardiovascular: Negative.   Gastrointestinal: Negative for nausea, diarrhea and constipation.  Genitourinary: Negative for urgency and frequency.  Skin: Negative for pallor.  Neurological: Negative for weakness, light-headedness, numbness and headaches.  Hematological: Negative for adenopathy. Does not bruise/bleed easily.  Psychiatric/Behavioral: Negative for dysphoric mood. The patient is not nervous/anxious.          Objective:   Physical Exam  Constitutional: He appears well-developed and well-nourished. No distress.       overwt and well appearing   HENT:  Head: Normocephalic and atraumatic.  Mouth/Throat: Oropharynx is clear and moist.  Eyes: Conjunctivae and EOM are normal. Pupils are equal, round, and reactive to light.  Neck: Normal range of motion. Neck supple. No JVD present. No thyromegaly present.  Cardiovascular: Normal rate and regular rhythm.   No murmur heard. Pulmonary/Chest: Effort normal and breath sounds normal. No respiratory distress. He exhibits no tenderness.  Abdominal: Soft. Bowel sounds are normal. He  exhibits no distension and no mass. There is no tenderness. There is no rebound.       No hsm  Musculoskeletal: Normal range of motion. He exhibits no edema and no tenderness.  Lymphadenopathy:    He has no cervical adenopathy.  Neurological: He is alert. He has normal reflexes. Coordination normal.  Skin: Skin is warm and dry. No rash noted. No erythema. No pallor.  Psychiatric: He has a normal mood and affect.          Assessment & Plan:

## 2010-11-21 NOTE — Patient Instructions (Addendum)
We will refer you for ultrasound of the liver  Eat more fruit and veg and protein that is lean Aim for 30 minutes of exercise 5 days per week  Follow up about 6 weeks after liver ultraound  Avoid tylenol over the counter and alcohol

## 2010-11-21 NOTE — Assessment & Plan Note (Signed)
Good control - no change in med  Disc wt loss and need for exercise

## 2010-11-21 NOTE — Assessment & Plan Note (Signed)
Likely fatty liver - so  More of an issue now  Disc change to lean protein and fruit and veg  Exercise 5 d per week  F/u 6 wk after abd [place]

## 2010-11-25 ENCOUNTER — Ambulatory Visit
Admission: RE | Admit: 2010-11-25 | Discharge: 2010-11-25 | Disposition: A | Discharge status: Home / Self Care | Payer: BC Managed Care – PPO | Source: Ambulatory Visit | Attending: Family Medicine | Admitting: Family Medicine

## 2010-11-25 DIAGNOSIS — R7401 Elevation of levels of liver transaminase levels: Secondary | ICD-10-CM

## 2010-12-01 ENCOUNTER — Telehealth: Payer: Self-pay

## 2010-12-01 NOTE — Telephone Encounter (Signed)
Patient notified as instructed by telephone.Pt already has f/u appt scheduled.

## 2010-12-01 NOTE — Telephone Encounter (Signed)
Message copied by Ozzie Hoyle on Thu Dec 01, 2010  6:49 PM ------      Message from: Loura Pardon      Created: Sun Nov 27, 2010  5:49 PM       Ultrasound confirms fatty liver disease as we suspected       Work on healthy diet/exercise and weight loss as we disc      Will disc further at f/u

## 2011-01-25 ENCOUNTER — Ambulatory Visit: Payer: BC Managed Care – PPO | Admitting: Family Medicine

## 2011-02-10 ENCOUNTER — Ambulatory Visit: Payer: BC Managed Care – PPO | Admitting: Family Medicine

## 2011-04-24 ENCOUNTER — Telehealth: Payer: Self-pay

## 2011-04-24 MED ORDER — LOSARTAN POTASSIUM 100 MG PO TABS: 100.0000 mg | ORAL_TABLET | Freq: Every day | ORAL | Status: DC

## 2011-04-24 NOTE — Telephone Encounter (Signed)
Refill sent for losartan potassium 100 mg one tablet daily.

## 2011-05-21 ENCOUNTER — Telehealth: Payer: Self-pay | Admitting: Family Medicine

## 2011-05-21 DIAGNOSIS — E785 Hyperlipidemia, unspecified: Secondary | ICD-10-CM

## 2011-05-21 DIAGNOSIS — R7401 Elevation of levels of liver transaminase levels: Secondary | ICD-10-CM

## 2011-05-21 DIAGNOSIS — I10 Essential (primary) hypertension: Secondary | ICD-10-CM

## 2011-05-21 NOTE — Telephone Encounter (Signed)
Message copied by Abner Greenspan on Sun May 21, 2011  8:31 PM ------      Message from: Ellamae Sia      Created: Fri May 19, 2011  3:31 PM      Regarding: Labs for [employer] for f/u

## 2011-05-22 ENCOUNTER — Other Ambulatory Visit (INDEPENDENT_AMBULATORY_CARE_PROVIDER_SITE_OTHER): Payer: BC Managed Care – PPO

## 2011-05-22 ENCOUNTER — Telehealth: Payer: Self-pay | Admitting: Family Medicine

## 2011-05-22 DIAGNOSIS — Z8349 Family history of other endocrine, nutritional and metabolic diseases: Secondary | ICD-10-CM

## 2011-05-22 DIAGNOSIS — E785 Hyperlipidemia, unspecified: Secondary | ICD-10-CM

## 2011-05-22 DIAGNOSIS — I10 Essential (primary) hypertension: Secondary | ICD-10-CM

## 2011-05-22 DIAGNOSIS — Z8489 Family history of other specified conditions: Secondary | ICD-10-CM

## 2011-05-22 DIAGNOSIS — R7401 Elevation of levels of liver transaminase levels: Secondary | ICD-10-CM

## 2011-05-22 HISTORY — DX: Family history of other endocrine, nutritional and metabolic diseases: Z83.49

## 2011-05-22 LAB — RENAL FUNCTION PANEL
Chloride: 109 mEq/L (ref 96–112)
GFR: 71.03 mL/min (ref >60.00)
Glucose, Bld: 91 mg/dL (ref 70–99)
Phosphorus: 2.9 mg/dL (ref 2.3–4.6)
Potassium: 4.1 mEq/L (ref 3.5–5.1)
Sodium: 143 mEq/L (ref 135–145)

## 2011-05-22 LAB — LIPID PANEL
Total CHOL/HDL Ratio: 4
VLDL: 37.8 mg/dL (ref 0.0–40.0)

## 2011-05-22 LAB — HEPATIC FUNCTION PANEL
ALT: 78 U/L — ABNORMAL HIGH (ref 0–53)
Albumin: 4.2 g/dL (ref 3.5–5.2)
Total Protein: 7.1 g/dL (ref 6.0–8.3)

## 2011-05-22 LAB — TSH: TSH: 2.33 u[IU]/mL (ref 0.35–5.50)

## 2011-05-22 LAB — VITAMIN B12: Vitamin B-12: 347 pg/mL (ref 211–911)

## 2011-05-22 NOTE — Progress Notes (Signed)
Addended by: Ellamae Sia on: 05/22/2011 09:57 AM   Modules accepted: Orders

## 2011-05-22 NOTE — Telephone Encounter (Signed)
Message copied by Abner Greenspan on Mon May 22, 2011  9:46 AM ------      Message from: Marchia Bond      Created: Mon May 22, 2011  8:27 AM      Regarding: Add b12 level       Pt wants to add b12 to labs today, he denies fatigue but says family have had issues with low b12.

## 2011-05-22 NOTE — Telephone Encounter (Signed)
done

## 2011-05-26 ENCOUNTER — Ambulatory Visit (INDEPENDENT_AMBULATORY_CARE_PROVIDER_SITE_OTHER): Payer: BC Managed Care – PPO | Admitting: Family Medicine

## 2011-05-26 ENCOUNTER — Encounter: Payer: Self-pay | Admitting: Family Medicine

## 2011-05-26 VITALS — BP 124/76 | HR 64 | Temp 98.0°F | Ht 68.25 in | Wt 207.8 lb

## 2011-05-26 DIAGNOSIS — E785 Hyperlipidemia, unspecified: Secondary | ICD-10-CM

## 2011-05-26 DIAGNOSIS — Z8349 Family history of other endocrine, nutritional and metabolic diseases: Secondary | ICD-10-CM

## 2011-05-26 DIAGNOSIS — Z8489 Family history of other specified conditions: Secondary | ICD-10-CM

## 2011-05-26 DIAGNOSIS — R7401 Elevation of levels of liver transaminase levels: Secondary | ICD-10-CM

## 2011-05-26 DIAGNOSIS — I10 Essential (primary) hypertension: Secondary | ICD-10-CM

## 2011-05-26 DIAGNOSIS — Z23 Encounter for immunization: Secondary | ICD-10-CM

## 2011-05-26 DIAGNOSIS — E669 Obesity, unspecified: Secondary | ICD-10-CM

## 2011-05-26 NOTE — Assessment & Plan Note (Signed)
bp in fair control at this time  No changes needed  Disc lifstyle change with low sodium diet and exercise   

## 2011-05-26 NOTE — Assessment & Plan Note (Signed)
Talked about getting down to bmi of 27 or below to help fatty liver With diet and exercise

## 2011-05-26 NOTE — Progress Notes (Signed)
Subjective:    Patient ID: Charlann Noss, male    DOB: [birth year], 50 y.o.   MRN: [id number]  HPI Here for f/u of fatty liver/ inc transaminases/ HTN / lipids  Diet is improved - proud of that  He researched the "[employer]" Less meat overall  Has cut down soda a lot and stopped alcohol  Is cutting fatty foods  Does unfortunately eat a [employer]  Drinks almond milk -- allergic to dairy -- makes his allergies bad   Mother died at easter -- DM and 40 years old  This was difficult for him He also works night shift- hard to organize meals  bp is 124/76 No ha or palpitations or edema  No change in med  Wt is stable  bmi is 31 Knows he needs to loose wt - disc bmi  Wants to get down to 180  Lipids- trig down some 189 Lab Results  Component Value Date   CHOL 172 05/22/2011   CHOL 182 11/17/2010   CHOL 178 05/19/2010   Lab Results  Component Value Date   HDL 40.00 05/22/2011   HDL 35.10* 11/17/2010   HDL 36.10* 05/19/2010   Lab Results  Component Value Date   LDLCALC 94 05/22/2011   LDLCALC 108* 05/19/2010   LDLCALC 89 11/12/2009   Lab Results  Component Value Date   TRIG 189.0* 05/22/2011   TRIG 201.0* 11/17/2010   TRIG 169.0* 05/19/2010   Lab Results  Component Value Date   CHOLHDL 4 05/22/2011   CHOLHDL 5 11/17/2010   CHOLHDL 5 05/19/2010   Lab Results  Component Value Date   LDLDIRECT 115.6 11/17/2010   LDLDIRECT 121.8 07/17/2007   LDLDIRECT 100.5 01/03/2007     Liver tests slt imp ast 42 from 50  Alt 78 from 87 Diet -- improved  US showed fatty liver   Family has hx of B12 deficiency  His is ok today Fairly balanced diet   Patient Active Problem List  Diagnoses  . HYPERLIPIDEMIA  . OBESITY  . ERECTILE DYSFUNCTION  . HYPERTENSION  . DIVERTICULOSIS, COLON  . TRANSAMINASES, SERUM, ELEVATED  . Family history of non-anemic vitamin B12 deficiency   Past Medical History  Diagnosis Date  . Diverticulitis     colon   . Hyperlipidemia   . Obesity     . Hypertension   . ED (erectile dysfunction)   . Low testosterone    Past Surgical History  Procedure Date  . Vasectomy 1999  . Hernia repair 2000    R & L   History  Substance Use Topics  . Smoking status: Never Smoker   . Smokeless tobacco: Not on file   Comment: age 30 for 1 year   . Alcohol Use: Yes     1 drink per month    Family History  Problem Relation Age of Onset  . Diabetes Mother   . Benign prostatic hyperplasia Father   . Hypertension Father    No Known Allergies Current Outpatient Prescriptions on File Prior to Visit  Medication Sig Dispense Refill  . losartan (COZAAR) 100 MG tablet Take 1 tablet (100 mg total) by mouth daily.  30 tablet  6  . Multiple Vitamin (DAILY MULTIVITAMIN PO) Take by mouth daily.        . Omega-3 Fatty Acids (FISH OIL) 1200 MG CAPS Take by mouth daily.        . Testosterone (ANDROGEL PUMP) 1.25 GM/ACT (1%) GEL Place onto the skin  as directed.        . sildenafil (VIAGRA) 100 MG tablet Take 100 mg by mouth as directed.        . vardenafil (LEVITRA) 20 MG tablet Take 20 mg by mouth daily as needed.           Review of Systems Review of Systems  Constitutional: Negative for fever, appetite change, and unexpected weight change. pos for fatigue at times due to hectic schedule  Eyes: Negative for pain and visual disturbance.  Respiratory: Negative for cough and shortness of breath.   Cardiovascular: Negative for cp or palpitations    Gastrointestinal: Negative for nausea, diarrhea and constipation.  Genitourinary: Negative for urgency and frequency.  Skin: Negative for pallor or rash   Neurological: Negative for weakness, light-headedness, numbness and headaches.  Hematological: Negative for adenopathy. Does not bruise/bleed easily.  Psychiatric/Behavioral: Negative for dysphoric mood. The patient is not nervous/anxious.          Objective:   Physical Exam  Constitutional: He appears well-developed and well-nourished. No  distress.       overwt and well appearing   HENT:  Head: Normocephalic and atraumatic.  Mouth/Throat: Oropharynx is clear and moist.  Eyes: Conjunctivae and EOM are normal. Pupils are equal, round, and reactive to light. No scleral icterus.  Neck: Normal range of motion. Neck supple. No JVD present. Carotid bruit is not present. No thyromegaly present.  Cardiovascular: Normal rate, regular rhythm, normal heart sounds and intact distal pulses.  Exam reveals no gallop.   Pulmonary/Chest: Effort normal and breath sounds normal. No respiratory distress. He has no wheezes.  Abdominal: Soft. Bowel sounds are normal. He exhibits no distension, no abdominal bruit and no mass. There is no tenderness.  Musculoskeletal: Normal range of motion. He exhibits no edema and no tenderness.  Lymphadenopathy:    He has no cervical adenopathy.  Neurological: He is alert. He has normal reflexes. No cranial nerve deficit. He exhibits normal muscle tone. Coordination normal.  Skin: Skin is warm and dry. No rash noted. No erythema. No pallor.  Psychiatric: He has a normal mood and affect.          Assessment & Plan:

## 2011-05-26 NOTE — Assessment & Plan Note (Signed)
His B12 normal  Disc balanced diet

## 2011-05-26 NOTE — Patient Instructions (Addendum)
Avoid red meat/ fried foods/ egg yolks/ fatty breakfast meats/ butter, cheese and high fat dairy/ and shellfish   Eat less/ and try to burn more calories with exercise to loose weight Goal is 5 days per week of 20-30 minutes of aerobic exercise As you loose weight - I think your liver condition will improve more as will your cholesterol  Schedule PE in 6 months with labs prior

## 2011-05-26 NOTE — Assessment & Plan Note (Signed)
abd US shows fatty liver- causing inc transaminases  No alcohol and cutting fats in diet  Disc exercise No pain or symptoms  Will re check in 6 months and then follow up

## 2011-09-08 ENCOUNTER — Other Ambulatory Visit: Payer: Self-pay

## 2011-09-08 ENCOUNTER — Telehealth: Payer: Self-pay

## 2011-09-08 MED ORDER — LOSARTAN POTASSIUM 100 MG PO TABS: 100.0000 mg | ORAL_TABLET | Freq: Every day | ORAL | Status: DC

## 2011-09-08 MED ORDER — SILDENAFIL CITRATE 100 MG PO TABS: 100.0000 mg | ORAL_TABLET | ORAL | Status: DC

## 2011-09-08 NOTE — Telephone Encounter (Signed)
Patient called requesting a refill on his Losartan and Viagra to be sent to his mail order pharmacy.  The Losartan was already done by his cardiologist.  I refilled his Viagra and notified patient that it was done.

## 2011-09-08 NOTE — Telephone Encounter (Signed)
Refill sent for losartan 100 mg sent to [facility]

## 2011-09-08 NOTE — Telephone Encounter (Signed)
Sent for a 90 day supply for losartan.

## 2011-11-23 ENCOUNTER — Other Ambulatory Visit: Payer: BC Managed Care – PPO

## 2011-11-23 ENCOUNTER — Telehealth: Payer: Self-pay | Admitting: Family Medicine

## 2011-11-23 DIAGNOSIS — Z0000 Encounter for general adult medical examination without abnormal findings: Secondary | ICD-10-CM | POA: Insufficient documentation

## 2011-11-23 DIAGNOSIS — Z8349 Family history of other endocrine, nutritional and metabolic diseases: Secondary | ICD-10-CM

## 2011-11-23 DIAGNOSIS — E785 Hyperlipidemia, unspecified: Secondary | ICD-10-CM

## 2011-11-23 NOTE — Telephone Encounter (Signed)
Message copied by Abner Greenspan on Thu Nov 23, 2011  7:15 AM ------      Message from: Marchia Bond      Created: Tue Nov 21, 2011 10:27 AM      Regarding: Cpx labs Thurs 5/9       Please order  future cpx labs for pt's upcomming lab appt.      Thanks      Aniceto Boss

## 2011-11-24 ENCOUNTER — Other Ambulatory Visit (INDEPENDENT_AMBULATORY_CARE_PROVIDER_SITE_OTHER): Payer: BC Managed Care – PPO

## 2011-11-24 DIAGNOSIS — E785 Hyperlipidemia, unspecified: Secondary | ICD-10-CM

## 2011-11-24 DIAGNOSIS — Z8489 Family history of other specified conditions: Secondary | ICD-10-CM

## 2011-11-24 DIAGNOSIS — Z0000 Encounter for general adult medical examination without abnormal findings: Secondary | ICD-10-CM

## 2011-11-24 DIAGNOSIS — Z8349 Family history of other endocrine, nutritional and metabolic diseases: Secondary | ICD-10-CM

## 2011-11-24 LAB — CBC WITH DIFFERENTIAL/PLATELET
Basophils Absolute: 0 10*3/uL (ref 0.0–0.1)
Eosinophils Relative: 0.7 % (ref 0.0–5.0)
Lymphs Abs: 2.1 10*3/uL (ref 0.7–4.0)
Monocytes Relative: 8 % (ref 3.0–12.0)
Neutrophils Relative %: 62.1 % (ref 43.0–77.0)
Platelets: 210 10*3/uL (ref 150.0–400.0)
RDW: 13.1 % (ref 11.5–14.6)
WBC: 7.2 10*3/uL (ref 4.5–10.5)

## 2011-11-24 LAB — VITAMIN B12: Vitamin B-12: 441 pg/mL (ref 211–911)

## 2011-11-24 LAB — TSH: TSH: 2.5 u[IU]/mL (ref 0.35–5.50)

## 2011-11-24 LAB — COMPREHENSIVE METABOLIC PANEL
ALT: 80 U/L — ABNORMAL HIGH (ref 0–53)
Albumin: 4.3 g/dL (ref 3.5–5.2)
Alkaline Phosphatase: 63 U/L (ref 39–117)
CO2: 25 mEq/L (ref 19–32)
GFR: 63.85 mL/min (ref >60.00)
Glucose, Bld: 89 mg/dL (ref 70–99)
Potassium: 3.8 mEq/L (ref 3.5–5.1)
Sodium: 144 mEq/L (ref 135–145)
Total Bilirubin: 0.9 mg/dL (ref 0.3–1.2)
Total Protein: 7.1 g/dL (ref 6.0–8.3)

## 2011-11-24 LAB — LIPID PANEL
Cholesterol: 168 mg/dL (ref 0–200)
LDL Cholesterol: 96 mg/dL (ref 0–99)
VLDL: 29.4 mg/dL (ref 0.0–40.0)

## 2011-11-28 ENCOUNTER — Ambulatory Visit (INDEPENDENT_AMBULATORY_CARE_PROVIDER_SITE_OTHER): Payer: BC Managed Care – PPO | Admitting: Family Medicine

## 2011-11-28 ENCOUNTER — Encounter: Payer: Self-pay | Admitting: Family Medicine

## 2011-11-28 VITALS — BP 124/80 | HR 51 | Temp 98.8°F | Ht 68.25 in | Wt 211.8 lb

## 2011-11-28 DIAGNOSIS — E669 Obesity, unspecified: Secondary | ICD-10-CM

## 2011-11-28 DIAGNOSIS — R7989 Other specified abnormal findings of blood chemistry: Secondary | ICD-10-CM

## 2011-11-28 DIAGNOSIS — Z0000 Encounter for general adult medical examination without abnormal findings: Secondary | ICD-10-CM

## 2011-11-28 DIAGNOSIS — R7401 Elevation of levels of liver transaminase levels: Secondary | ICD-10-CM

## 2011-11-28 DIAGNOSIS — E291 Testicular hypofunction: Secondary | ICD-10-CM

## 2011-11-28 DIAGNOSIS — E785 Hyperlipidemia, unspecified: Secondary | ICD-10-CM

## 2011-11-28 DIAGNOSIS — I10 Essential (primary) hypertension: Secondary | ICD-10-CM

## 2011-11-28 MED ORDER — LOSARTAN POTASSIUM 100 MG PO TABS: 100.0000 mg | ORAL_TABLET | Freq: Every day | ORAL | Status: DC

## 2011-11-28 NOTE — Assessment & Plan Note (Signed)
bp in fair control at this time  No changes needed  Disc lifstyle change with low sodium diet and exercise   Refilled cozaar Labs rev

## 2011-11-28 NOTE — Assessment & Plan Note (Signed)
This is better controlled - diet Disc goals for lipids and reasons to control them Rev labs with pt Rev low sat fat diet in detail

## 2011-11-28 NOTE — Progress Notes (Signed)
Subjective:    Patient ID: Terry Dixon, male    DOB: 1961/07/05, 51 y.o.   MRN: [id number]  HPI Here for health maintenance exam and to review chronic medical problems    bp is good 124/80 Wt is up 3 lb with bmi of 31 Has hx of obesity and also fatty liver LFT elevated from this but stable  Lab Results  Component Value Date   ALT 80* 11/24/2011   AST 43* 11/24/2011   ALKPHOS 63 11/24/2011   BILITOT 0.9 11/24/2011     fam hx of B12 def Lab Results  Component Value Date   VITAMINB12 441 11/24/2011   this was good   Lab Results  Component Value Date   CHOL 168 11/24/2011   HDL 42.50 11/24/2011   LDLCALC 96 11/24/2011   LDLDIRECT 115.6 11/17/2010   TRIG 147.0 11/24/2011   CHOLHDL 4 11/24/2011     Other labs ok   Diet- has cut out caff and sugar in soda , is also staying away from junk food - eats a lot veg and meat too  Exercise -- works in the garden , thinks he could bike and walk   Takes testosterone replacement That is done by [facility]- last DRE - was 2 mo ago , tx for low testosterone  No urinary symptoms   Has mole to check on his back   Patient Active Problem List  Diagnoses  . HYPERLIPIDEMIA  . OBESITY  . ERECTILE DYSFUNCTION  . HYPERTENSION  . DIVERTICULOSIS, COLON  . TRANSAMINASES, SERUM, ELEVATED  . Family history of non-anemic vitamin B12 deficiency  . Routine general medical examination at a health care facility  . Low serum testosterone level   Past Medical History  Diagnosis Date  . Diverticulitis     colon   . Hyperlipidemia   . Obesity   . Hypertension   . ED (erectile dysfunction)   . Low testosterone    Past Surgical History  Procedure Date  . Vasectomy 1999  . Hernia repair 2000    R & L   History  Substance Use Topics  . Smoking status: Never Smoker   . Smokeless tobacco: Not on file   Comment: age 44 for 1 year   . Alcohol Use: Yes     1 drink per month    Family History  Problem Relation Age of Onset  . Diabetes  Mother   . Benign prostatic hyperplasia Father   . Hypertension Father    No Known Allergies Current Outpatient Prescriptions on File Prior to Visit  Medication Sig Dispense Refill  . Multiple Vitamin (DAILY MULTIVITAMIN PO) Take by mouth daily.        . Omega-3 Fatty Acids (FISH OIL) 1200 MG CAPS Take by mouth daily.        . sildenafil (VIAGRA) 100 MG tablet Take 1 tablet (100 mg total) by mouth as directed.  10 tablet  3  . Testosterone (ANDROGEL PUMP) 1.25 GM/ACT (1%) GEL Place onto the skin as directed.        . vardenafil (LEVITRA) 20 MG tablet Take 20 mg by mouth daily as needed.        Marland Kitchen DISCONTD: losartan (COZAAR) 100 MG tablet Take 1 tablet (100 mg total) by mouth daily.  90 tablet  3       Review of Systems Review of Systems  Constitutional: Negative for fever, appetite change, fatigue and unexpected weight change.  Eyes: Negative for pain and  visual disturbance.  Respiratory: Negative for cough and shortness of breath.   Cardiovascular: Negative for cp or palpitations    Gastrointestinal: Negative for nausea, diarrhea and constipation.  Genitourinary: Negative for urgency and frequency.  Skin: Negative for pallor or rash   Neurological: Negative for weakness, light-headedness, numbness and headaches.  Hematological: Negative for adenopathy. Does not bruise/bleed easily.  Psychiatric/Behavioral: Negative for dysphoric mood. The patient is not nervous/anxious.         Objective:   Physical Exam  Constitutional: He appears well-developed and well-nourished. No distress.       Obese and well appearing   HENT:  Head: Normocephalic and atraumatic.  Right Ear: External ear normal.  Left Ear: External ear normal.  Nose: Nose normal.  Mouth/Throat: Oropharynx is clear and moist. No oropharyngeal exudate.  Eyes: Conjunctivae and EOM are normal. Pupils are equal, round, and reactive to light. No scleral icterus.  Neck: Normal range of motion. Neck supple. No JVD present.  Carotid bruit is not present. No thyromegaly present.  Cardiovascular: Normal rate, regular rhythm, normal heart sounds and intact distal pulses.  Exam reveals no gallop.   No murmur heard. Pulmonary/Chest: Effort normal and breath sounds normal. No respiratory distress. He has no wheezes. He exhibits no tenderness.  Abdominal: Soft. Bowel sounds are normal. He exhibits no distension, no abdominal bruit and no mass. There is no tenderness.       No HSM Neg murphy's sign  Musculoskeletal: Normal range of motion. He exhibits no edema and no tenderness.  Lymphadenopathy:    He has no cervical adenopathy.  Neurological: He is alert. He has normal reflexes. No cranial nerve deficit. He exhibits normal muscle tone. Coordination normal.  Skin: Skin is warm and dry. No rash noted. No erythema. No pallor.  Psychiatric: He has a normal mood and affect.          Assessment & Plan:

## 2011-11-28 NOTE — Assessment & Plan Note (Addendum)
Reviewed health habits including diet and exercise and skin cancer prevention Also reviewed health mt list, fam hx and immunizations   Rev wellness labs in detail Wt loss needed  Checked skin - did rec use of sun prot  Continues to see urol for other issues

## 2011-11-28 NOTE — Assessment & Plan Note (Signed)
Stable from fatty liver Disc need for wt loss in detail and strategy for that

## 2011-11-28 NOTE — Assessment & Plan Note (Signed)
With fatty liver Discussed how this problem influences overall health and the risks it imposes  Reviewed plan for weight loss with lower calorie diet (via better food choices and also portion control or program like weight watchers) and exercise building up to or more than 30 minutes 5 days per week including some aerobic activity

## 2011-11-28 NOTE — Patient Instructions (Signed)
Aim to work up to exercise 5 days per week for at least 30 minutes  Also for weight loss - you may need to cut portions by 1/4 to 1/3  Keep eating healthy- avoiding sugars and fats  Wear sun block when you are exposed

## 2012-02-21 IMAGING — US US ABDOMEN COMPLETE
1 series · 14 of 25 positions shown · non-contrast
Comparison: None.

CLINICAL DATA: Elevated liver function tests, otherwise
asymptomatic.

COMPLETE ABDOMINAL ULTRASOUND

[Series 1: us abdomen complete · 0.31mm/px · 14 of 59 slices shown]
[im 1/59]
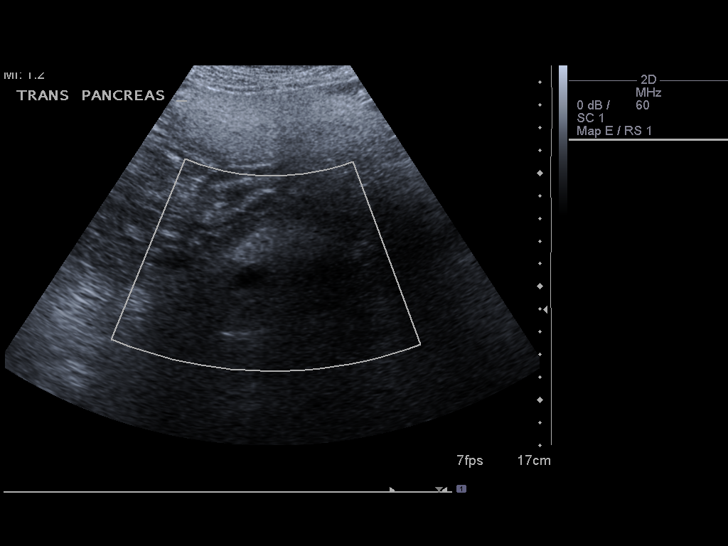
[im 5/59]
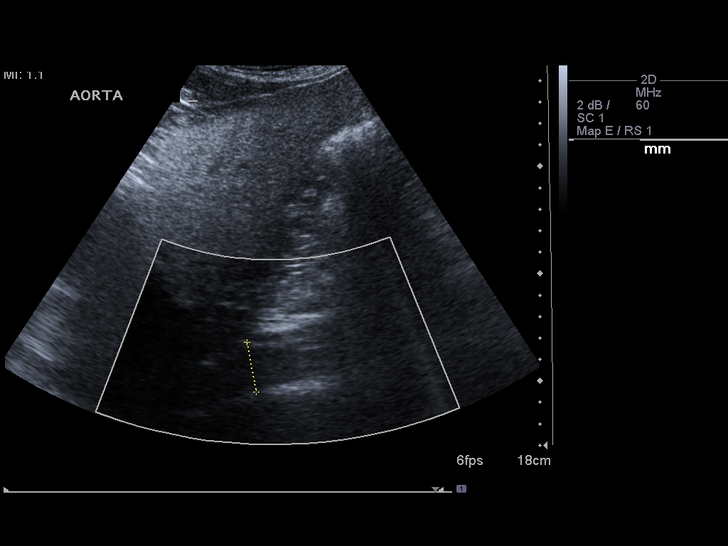
[im 10/59]
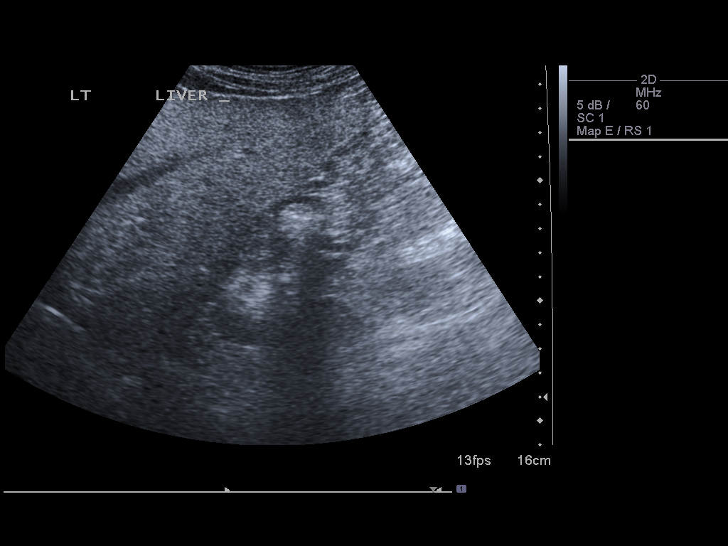
[im 15/59]
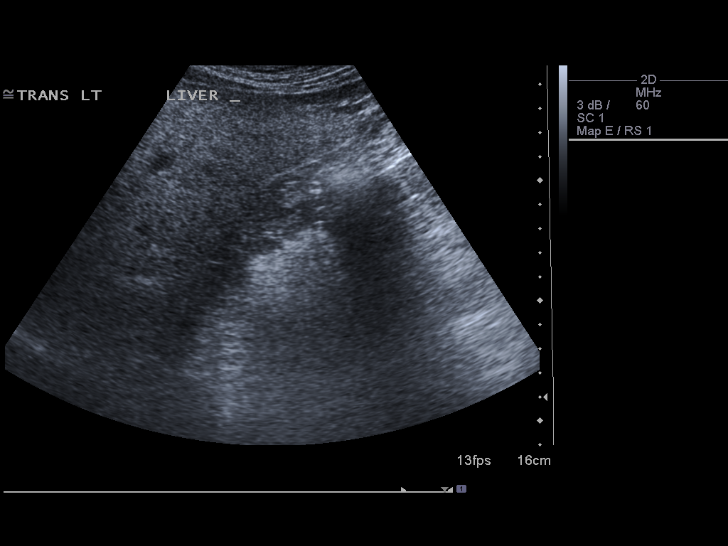
[im 20/59]
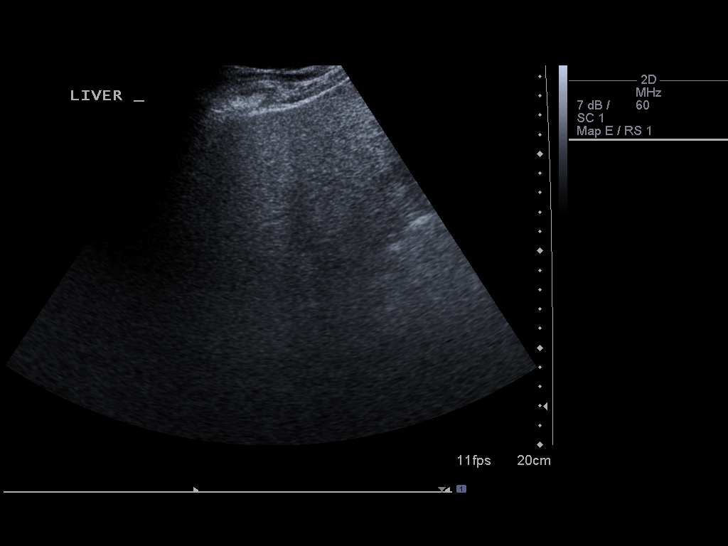
[im 22/59]
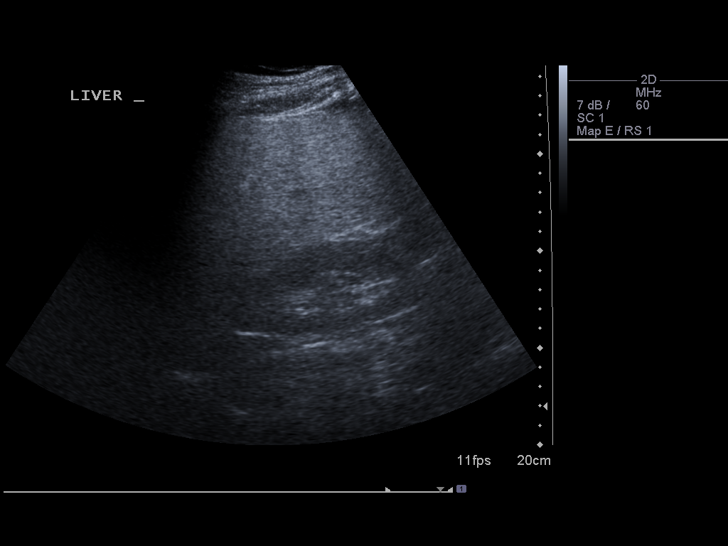
[im 27/59]
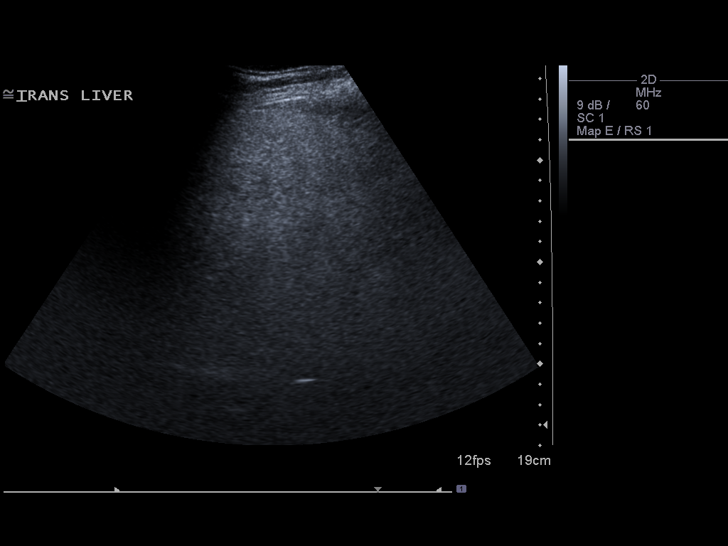
[im 32/59]
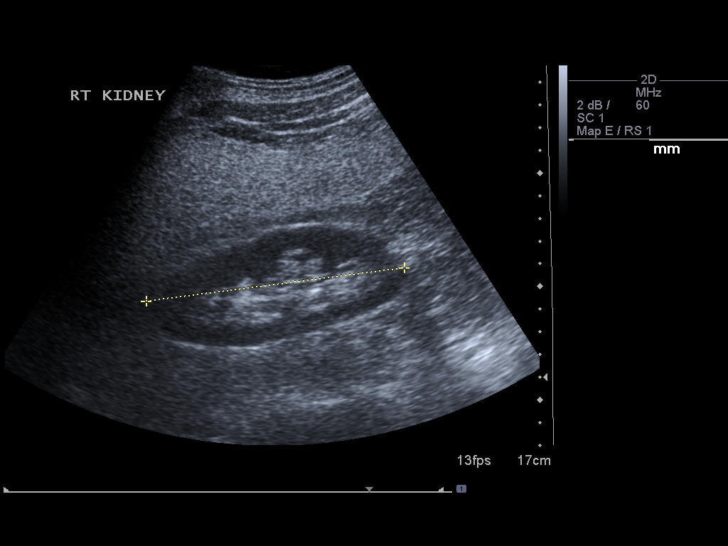
[im 37/59]
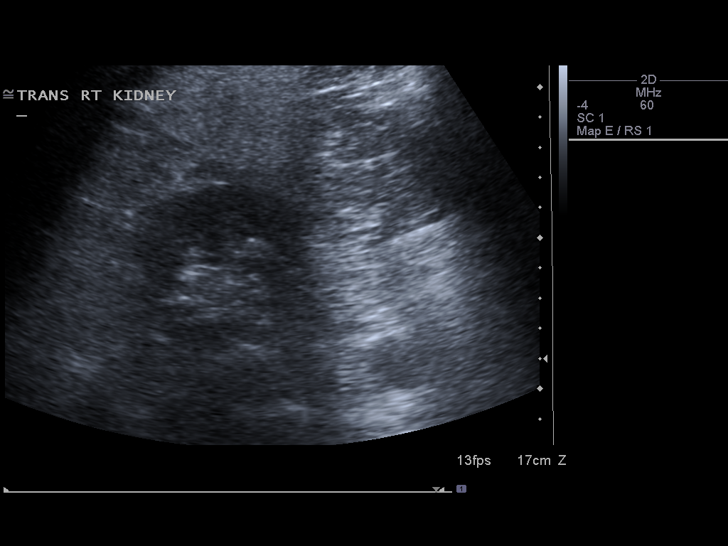
[im 39/59]
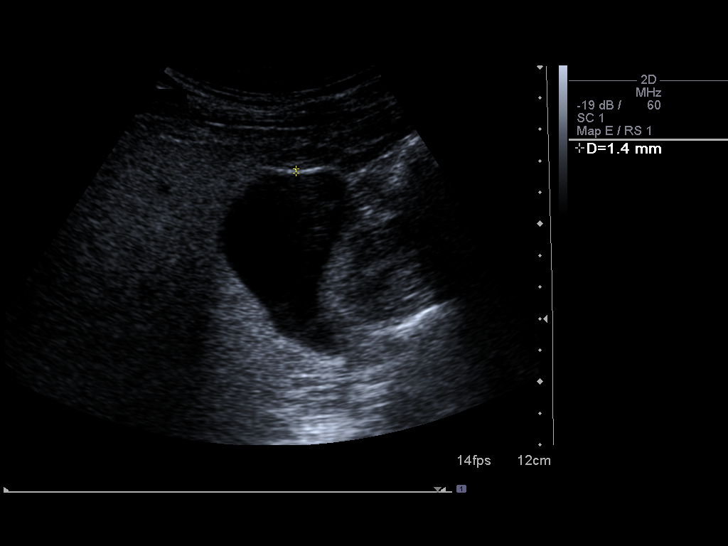
[im 44/59]
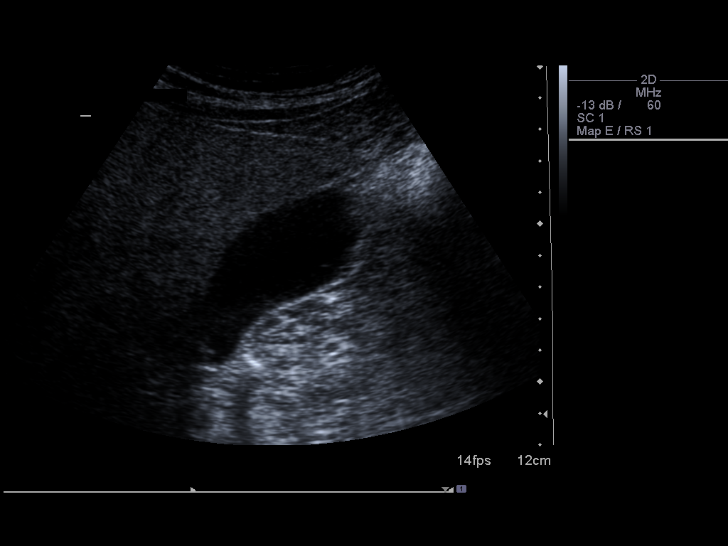
[im 49/59]
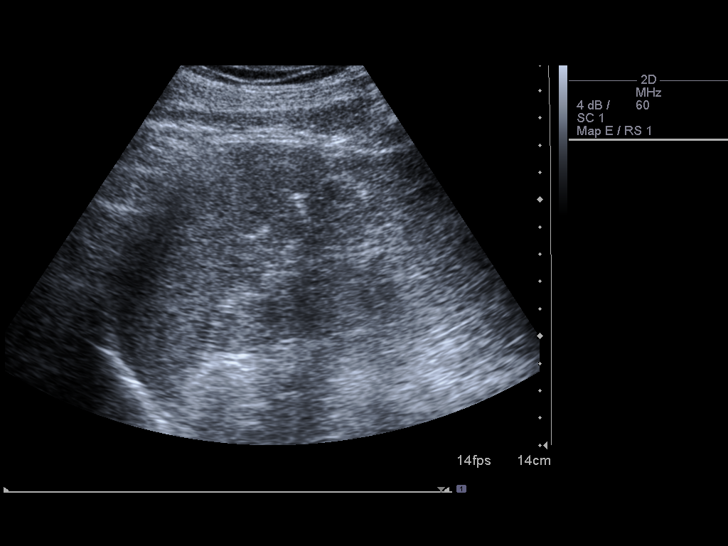
[im 54/59]
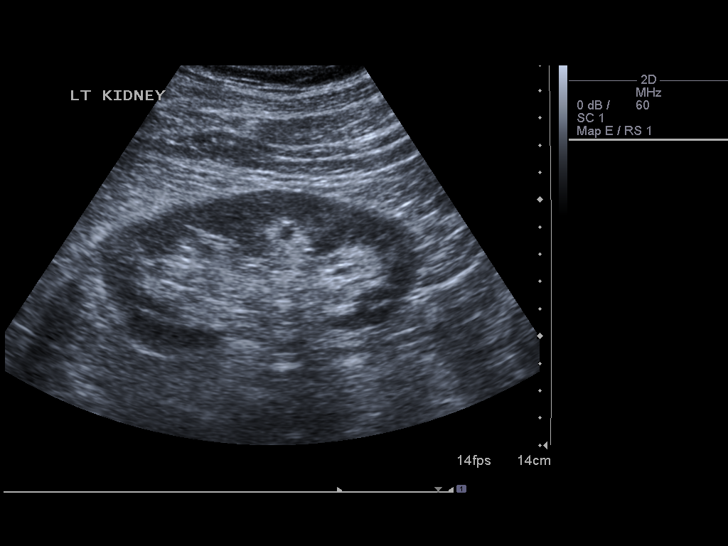
[im 59/59]
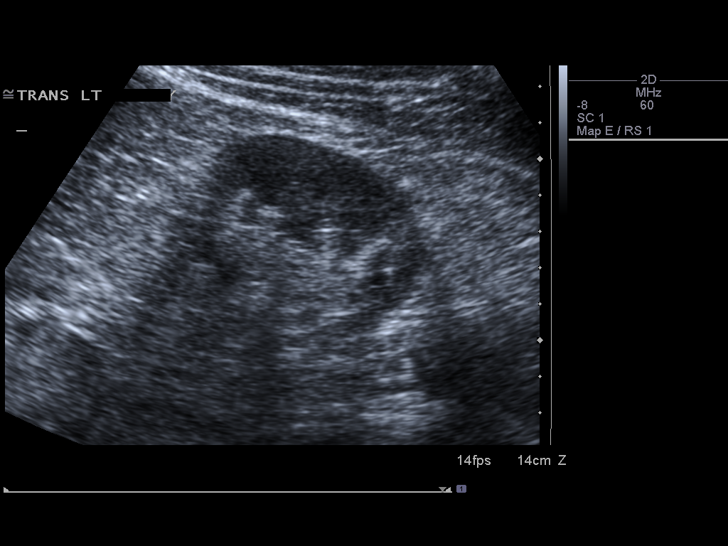

[14 of 25 positions shown; findings below may reference images not displayed]

FINDINGS: Gallbladder:  Within normal limits.  No wall thickening or
pericholecystic fluid.  Negative sonographic Murphy's sign.

Common bile duct:  Normal, measuring 4 mm.

Liver:  No focal lesion identified.  Increased parenchymal
echogenicity.

IVC:  Incompletely evaluated secondary to overlying bowel gas
artifact.

Pancreas: Incompletely imaged, within normal limits where seen. The
body and tail are obscured by overlying bowel gas artifact.

Spleen:  10.8 cm, within normal limits.

Right Kidney:  11.5 cm, normal.  No hydronephrosis.

Left Kidney:  12.1 cm, normal.  No hydronephrosis.

Abdominal aorta:  No aneurysm identified.  The aorta measures up to
U 2.4 cm.  The bifurcation is not well visualized secondary to
overlying bowel gas.
IMPRESSION: Increased hepatic echogenicity, in keeping with diffuse
fatty infiltration.  No focal lesion.

## 2012-05-31 ENCOUNTER — Ambulatory Visit (INDEPENDENT_AMBULATORY_CARE_PROVIDER_SITE_OTHER): Payer: BC Managed Care – PPO | Admitting: Family Medicine

## 2012-05-31 ENCOUNTER — Encounter: Payer: Self-pay | Admitting: Internal Medicine

## 2012-05-31 ENCOUNTER — Encounter: Payer: Self-pay | Admitting: Family Medicine

## 2012-05-31 VITALS — BP 118/66 | HR 60 | Temp 98.0°F | Ht 68.25 in | Wt 214.2 lb

## 2012-05-31 DIAGNOSIS — R7401 Elevation of levels of liver transaminase levels: Secondary | ICD-10-CM

## 2012-05-31 DIAGNOSIS — I10 Essential (primary) hypertension: Secondary | ICD-10-CM

## 2012-05-31 DIAGNOSIS — Z1211 Encounter for screening for malignant neoplasm of colon: Secondary | ICD-10-CM

## 2012-05-31 DIAGNOSIS — E669 Obesity, unspecified: Secondary | ICD-10-CM

## 2012-05-31 DIAGNOSIS — Z23 Encounter for immunization: Secondary | ICD-10-CM

## 2012-05-31 LAB — HEPATIC FUNCTION PANEL
Albumin: 4.2 g/dL (ref 3.5–5.2)
Total Bilirubin: 1.2 mg/dL (ref 0.3–1.2)

## 2012-05-31 NOTE — Progress Notes (Signed)
Subjective:    Patient ID: Terry Dixon, male    DOB: [birth year], 51 y.o.   MRN: [id number]  HPI Here for f/u of chronic conditions  Wt is up 3 lb with bmi of 32  Does not weigh himself often- aware he has been gaining    Nothing new going on - feels ok     Has hx of fatty liver/ inc transaminases Lab Results  Component Value Date   ALT 80* 11/24/2011   AST 43* 11/24/2011   ALKPHOS 63 11/24/2011   BILITOT 0.9 11/24/2011  he is watching what he eats  Cut down soft drinks to one a day - more water  Does watch fat in diet - no greasy foods at all , red meat 1 time per week max , and quite a few eggs Eats a lot of vegetables    No exercise- too tired from work Lot of yard work in the summer    Also high chol Lab Results  Component Value Date   CHOL 168 11/24/2011   HDL 42.50 11/24/2011   LDLCALC 96 11/24/2011   LDLDIRECT 115.6 11/17/2010   TRIG 147.0 11/24/2011   CHOLHDL 4 11/24/2011   fair at last check   bp is stable today  No cp or palpitations or headaches or edema  No side effects to medicines  BP Readings from Last 3 Encounters:  05/31/12 118/66  11/28/11 124/80  05/26/11 124/76     Flu vaccine - will take one this season   Colon cancer screen-- wants to schedule colonosc   Patient Active Problem List  Diagnosis  . HYPERLIPIDEMIA  . OBESITY  . ERECTILE DYSFUNCTION  . HYPERTENSION  . DIVERTICULOSIS, COLON  . TRANSAMINASES, SERUM, ELEVATED  . Family history of non-anemic vitamin B12 deficiency  . Routine general medical examination at a health care facility  . Low serum testosterone level  . Colon cancer screening   Past Medical History  Diagnosis Date  . Diverticulitis     colon   . Hyperlipidemia   . Obesity   . Hypertension   . ED (erectile dysfunction)   . Low testosterone    Past Surgical History  Procedure Date  . Vasectomy 1999  . Hernia repair 2000    R & L   History  Substance Use Topics  . Smoking status: Never Smoker   .  Smokeless tobacco: Not on file     Comment: age 79 for 1 year   . Alcohol Use: Yes     Comment: 1 drink per month    Family History  Problem Relation Age of Onset  . Diabetes Mother   . Benign prostatic hyperplasia Father   . Hypertension Father    Allergies  Allergen Reactions  . Dairy Aid (Lactase)    Current Outpatient Prescriptions on File Prior to Visit  Medication Sig Dispense Refill  . losartan (COZAAR) 100 MG tablet Take 1 tablet (100 mg total) by mouth daily.  90 tablet  3  . Multiple Vitamin (DAILY MULTIVITAMIN PO) Take by mouth daily.        . Omega-3 Fatty Acids (FISH OIL) 1200 MG CAPS Take by mouth daily.        . sildenafil (VIAGRA) 100 MG tablet Take 1 tablet (100 mg total) by mouth as directed.  10 tablet  3  . Testosterone (ANDROGEL PUMP) 1.25 GM/ACT (1%) GEL Place onto the skin as directed.  Review of Systems Review of Systems  Constitutional: Negative for fever, appetite change, fatigue and unexpected weight change.  Eyes: Negative for pain and visual disturbance.  Respiratory: Negative for cough and shortness of breath.   Cardiovascular: Negative for cp or palpitations    Gastrointestinal: Negative for nausea, diarrhea and constipation. neg for abd pain  Genitourinary: Negative for urgency and frequency.  Skin: Negative for pallor or rash   Neurological: Negative for weakness, light-headedness, numbness and headaches.  Hematological: Negative for adenopathy. Does not bruise/bleed easily.  Psychiatric/Behavioral: Negative for dysphoric mood. The patient is not nervous/anxious.         Objective:   Physical Exam  Constitutional: He appears well-developed and well-nourished. No distress.       obese and well appearing   HENT:  Head: Normocephalic and atraumatic.  Right Ear: External ear normal.  Left Ear: External ear normal.  Nose: Nose normal.  Mouth/Throat: Oropharynx is clear and moist.  Eyes: Conjunctivae normal and EOM are normal.  Pupils are equal, round, and reactive to light. Right eye exhibits no discharge. Left eye exhibits no discharge.  Neck: Normal range of motion. Neck supple. No JVD present. Carotid bruit is not present. No thyromegaly present.  Cardiovascular: Normal rate, regular rhythm, normal heart sounds and intact distal pulses.  Exam reveals no gallop.   Pulmonary/Chest: Effort normal and breath sounds normal. No respiratory distress. He has no wheezes.  Abdominal: Soft. Bowel sounds are normal. He exhibits no distension, no abdominal bruit and no mass. There is no tenderness. There is negative Murphy's sign.  Musculoskeletal: Normal range of motion. He exhibits no edema and no tenderness.  Lymphadenopathy:    He has no cervical adenopathy.  Neurological: He is alert. He has normal reflexes. No cranial nerve deficit. He exhibits normal muscle tone. Coordination normal.  Skin: Skin is warm and dry. No rash noted. No erythema. No pallor.  Psychiatric: He has a normal mood and affect.          Assessment & Plan:

## 2012-05-31 NOTE — Assessment & Plan Note (Signed)
Discussed how this problem influences overall health and the risks it imposes (incl fatty liver)  Reviewed plan for weight loss with lower calorie diet (via better food choices and also portion control or program like weight watchers) and exercise building up to or more than 30 minutes 5 days per week including some aerobic activity

## 2012-05-31 NOTE — Assessment & Plan Note (Signed)
Ref for colonosc

## 2012-05-31 NOTE — Assessment & Plan Note (Signed)
Hx of fatty liver Pt continues to gain weight  No etoh or tylenol  Pt aware he needs to loose Rev low fat diet

## 2012-05-31 NOTE — Assessment & Plan Note (Signed)
bp in fair control at this time  No changes needed  Disc lifstyle change with low sodium diet and exercise   

## 2012-05-31 NOTE — Patient Instructions (Addendum)
Please work on weight loss- decrease calories and try to come up with an exercise plan  Labs today for liver  We will refer you for colonoscopy at check out  Flu shot today  Follow up for annual exam in 6 months with labs prior

## 2012-06-03 ENCOUNTER — Encounter: Payer: Self-pay | Admitting: *Deleted

## 2012-07-23 ENCOUNTER — Encounter: Payer: Self-pay | Admitting: Internal Medicine

## 2012-07-23 ENCOUNTER — Ambulatory Visit (AMBULATORY_SURGERY_CENTER): Payer: BC Managed Care – PPO | Admitting: *Deleted

## 2012-07-23 VITALS — Ht 69.0 in | Wt 216.6 lb

## 2012-07-23 DIAGNOSIS — Z1211 Encounter for screening for malignant neoplasm of colon: Secondary | ICD-10-CM

## 2012-07-23 MED ORDER — MOVIPREP 100 G PO SOLR: ORAL | Status: DC

## 2012-07-25 ENCOUNTER — Telehealth: Payer: Self-pay | Admitting: Internal Medicine

## 2012-07-25 NOTE — Telephone Encounter (Signed)
Pt stopped by previsit.  Told pt okay to take Nyquil.

## 2012-08-01 ENCOUNTER — Ambulatory Visit (AMBULATORY_SURGERY_CENTER): Payer: BC Managed Care – PPO | Admitting: Internal Medicine

## 2012-08-01 ENCOUNTER — Encounter: Payer: BC Managed Care – PPO | Admitting: Internal Medicine

## 2012-08-01 ENCOUNTER — Encounter: Payer: Self-pay | Admitting: Internal Medicine

## 2012-08-01 VITALS — BP 148/96 | HR 69 | Temp 99.0°F | Resp 32 | Ht 69.0 in | Wt 216.0 lb

## 2012-08-01 DIAGNOSIS — Z1211 Encounter for screening for malignant neoplasm of colon: Secondary | ICD-10-CM

## 2012-08-01 MED ORDER — SODIUM CHLORIDE 0.9 % IV SOLN: 500.0000 mL | INTRAVENOUS | Status: DC

## 2012-08-01 NOTE — Op Note (Signed)
[place]  [address]. [place], [address]   COLONOSCOPY PROCEDURE REPORT  PATIENT: Terry Dixon, Terry Dixon  MR#: [id number] BIRTHDATE: 03/26/1961 , 51  yrs. old GENDER: Male ENDOSCOPIST: Jerene Bears, MD REFERRED EX:[facility] [place] [facility]. PROCEDURE DATE:  08/01/2012 PROCEDURE:   Colonoscopy, screening ASA CLASS:   Class II INDICATIONS:average risk screening and first colonoscopy. MEDICATIONS: MAC sedation, administered by CRNA and propofol (Diprivan) 13m IV  DESCRIPTION OF PROCEDURE:   After the risks benefits and alternatives of the procedure were thoroughly explained, informed consent was obtained.  A digital rectal exam revealed external hemorrhoids and A digital rectal exam revealed no rectal mass. The LB [id number] endoscope was introduced through the anus and advanced to the terminal ileum which was intubated for a short distance. No adverse events experienced.   The quality of the prep was good, using MoviPrep  The instrument was then slowly withdrawn as the colon was fully examined.    COLON FINDINGS: The mucosa appeared normal in the terminal ileum. A normal appearing cecum, ileocecal valve, and appendiceal orifice were identified.  The ascending, hepatic flexure, transverse, splenic flexure, descending, sigmoid colon and rectum appeared unremarkable.  No polyps or cancers were seen.  Retroflexed views revealed no abnormalities. The time to cecum=1 minutes 30 seconds. Withdrawal time=7 minutes 53 seconds.  The scope was withdrawn and the procedure completed.  COMPLICATIONS: There were no complications.  ENDOSCOPIC IMPRESSION: 1.   Normal mucosa in the terminal ileum 2.   Normal colon  RECOMMENDATIONS: You should continue to follow colorectal cancer screening guidelines for "routine risk" patients with a repeat colonoscopy in 10 years. There is no need for FOBT (stool) testing for at least 5 years.   eSigned:  JJerene Bears MD 08/01/2012  2:45 PM   cc: MAbner Greenspan MD and The Patient

## 2012-08-01 NOTE — Patient Instructions (Addendum)
Discharge instructions given with verbal understanding. Normal exam. Resume previous medications. YOU HAD AN ENDOSCOPIC PROCEDURE TODAY AT THE [facility]: Refer to the procedure report that was given to you for any specific questions about what was found during the examination.  If the procedure report does not answer your questions, please call your gastroenterologist to clarify.  If you requested that your care partner not be given the details of your procedure findings, then the procedure report has been included in a sealed envelope for you to review at your convenience later.  YOU SHOULD EXPECT: Some feelings of bloating in the abdomen. Passage of more gas than usual.  Walking can help get rid of the air that was put into your GI tract during the procedure and reduce the bloating. If you had a lower endoscopy (such as a colonoscopy or flexible sigmoidoscopy) you may notice spotting of blood in your stool or on the toilet paper. If you underwent a bowel prep for your procedure, then you may not have a normal bowel movement for a few days.  DIET: Your first meal following the procedure should be a light meal and then it is ok to progress to your normal diet.  A half-sandwich or bowl of soup is an example of a good first meal.  Heavy or fried foods are harder to digest and may make you feel nauseous or bloated.  Likewise meals heavy in dairy and vegetables can cause extra gas to form and this can also increase the bloating.  Drink plenty of fluids but you should avoid alcoholic beverages for 24 hours.  ACTIVITY: Your care partner should take you home directly after the procedure.  You should plan to take it easy, moving slowly for the rest of the day.  You can resume normal activity the day after the procedure however you should NOT DRIVE or use heavy machinery for 24 hours (because of the sedation medicines used during the test).    SYMPTOMS TO REPORT IMMEDIATELY: A gastroenterologist  can be reached at any hour.  During normal business hours, 8:30 AM to 5:00 PM Monday through Friday, call [phone].  After hours and on weekends, please call the GI answering service at [phone] who will take a message and have the physician on call contact you.   Following lower endoscopy (colonoscopy or flexible sigmoidoscopy):  Excessive amounts of blood in the stool  Significant tenderness or worsening of abdominal pains  Swelling of the abdomen that is new, acute  Fever of 100F or higher  FOLLOW UP: If any biopsies were taken you will be contacted by phone or by letter within the next 1-3 weeks.  Call your gastroenterologist if you have not heard about the biopsies in 3 weeks.  Our staff will call the home number listed on your records the next business day following your procedure to check on you and address any questions or concerns that you may have at that time regarding the information given to you following your procedure. This is a courtesy call and so if there is no answer at the home number and we have not heard from you through the emergency physician on call, we will assume that you have returned to your regular daily activities without incident.  SIGNATURES/CONFIDENTIALITY: You and/or your care partner have signed paperwork which will be entered into your electronic medical record.  These signatures attest to the fact that that the information above on your After Visit Summary has been reviewed   and is understood.  Full responsibility of the confidentiality of this discharge information lies with you and/or your care-partner. 

## 2012-08-01 NOTE — Progress Notes (Signed)
Lidocaine-40mg IV prior to Propofol InductionPropofol given over incremental dosages 

## 2012-08-01 NOTE — Progress Notes (Signed)
Patient did not experience any of the following events: a burn prior to discharge; a fall within the facility; wrong site/side/patient/procedure/implant event; or a hospital transfer or hospital admission upon discharge from the facility. (G8907) Patient did not have preoperative order for IV antibiotic SSI prophylaxis. (G8918)  

## 2012-08-02 ENCOUNTER — Telehealth: Payer: Self-pay | Admitting: *Deleted

## 2012-08-02 NOTE — Telephone Encounter (Signed)
No answer, message left for the patient. 

## 2012-10-08 ENCOUNTER — Encounter: Payer: Self-pay | Admitting: Family Medicine

## 2012-10-08 ENCOUNTER — Ambulatory Visit (INDEPENDENT_AMBULATORY_CARE_PROVIDER_SITE_OTHER): Payer: BC Managed Care – PPO | Admitting: Family Medicine

## 2012-10-08 VITALS — BP 144/72 | HR 65 | Temp 98.2°F

## 2012-10-08 DIAGNOSIS — J069 Acute upper respiratory infection, unspecified: Secondary | ICD-10-CM

## 2012-10-08 MED ORDER — GUAIFENESIN-CODEINE 100-10 MG/5ML PO SYRP: 5.0000 mL | ORAL_SOLUTION | Freq: Four times a day (QID) | ORAL | Status: DC | PRN

## 2012-10-08 NOTE — Patient Instructions (Addendum)
Drink lots of water and get some rest  Take the cough syrup with codeine as needed for severe cough  Try zyrtec 10 mg over the counter once daily for nasal symptoms including drip/ runny nose and sneezing - this will also help during allergy season  Update if not starting to improve in a week or if worsening

## 2012-10-08 NOTE — Assessment & Plan Note (Signed)
With persistent cough and rhinorrhea  Px for robitussin AC given for times when he is not working (warned of sedation) and recommended zyrtec for antihistamine Disc symptomatic care - see instructions on AVS Handout given Update if not starting to improve in a week or if worsening

## 2012-10-08 NOTE — Progress Notes (Signed)
Subjective:    Patient ID: Terry Dixon, male    DOB: 07-Dec-1960, 52 y.o.   MRN: [id number]  HPI Here with allergy and sinus symptoms Over the weekend - bad sinus congestion - followed by a very bad cough (chest is sore from coughing)   R ear hurts too  No headache or sinus pain   Took mucinex  No antihistamines   He avoids decongestants   Patient Active Problem List  Diagnosis  . HYPERLIPIDEMIA  . OBESITY  . ERECTILE DYSFUNCTION  . HYPERTENSION  . DIVERTICULOSIS, COLON  . TRANSAMINASES, SERUM, ELEVATED  . Family history of non-anemic vitamin B12 deficiency  . Routine general medical examination at a health care facility  . Low serum testosterone level  . Colon cancer screening   Past Medical History  Diagnosis Date  . Diverticulitis     colon   . Hyperlipidemia   . Obesity   . Hypertension   . ED (erectile dysfunction)   . Low testosterone    Past Surgical History  Procedure Laterality Date  . Vasectomy  1999  . Hernia repair  2000    R & L   History  Substance Use Topics  . Smoking status: Never Smoker   . Smokeless tobacco: Never Used     Comment: age 6 for 1 year   . Alcohol Use: Yes     Comment: 1 drink per month    Family History  Problem Relation Age of Onset  . Diabetes Mother   . Benign prostatic hyperplasia Father   . Hypertension Father    Allergies  Allergen Reactions  . Dairy Aid (Lactase)    Current Outpatient Prescriptions on File Prior to Visit  Medication Sig Dispense Refill  . losartan (COZAAR) 100 MG tablet Take 1 tablet (100 mg total) by mouth daily.  90 tablet  3  . Multiple Vitamin (DAILY MULTIVITAMIN PO) Take by mouth daily.        . Omega-3 Fatty Acids (FISH OIL) 1200 MG CAPS Take by mouth daily.        . sildenafil (VIAGRA) 100 MG tablet Take 1 tablet (100 mg total) by mouth as directed.  10 tablet  3  . Testosterone (ANDROGEL PUMP) 1.25 GM/ACT (1%) GEL Place onto the skin as directed.         No current  facility-administered medications on file prior to visit.    Review of Systems Review of Systems  Constitutional: Negative for fever, appetite change,  and unexpected weight change.  ENt pos for congestion/ ear fullness/ neg for sinus pain  Eyes: Negative for pain and visual disturbance.  Respiratory: Negative for wheeze and shortness of breath.   Cardiovascular: Negative for cp or palpitations    Gastrointestinal: Negative for nausea, diarrhea and constipation.  Genitourinary: Negative for urgency and frequency.  Skin: Negative for pallor or rash   Neurological: Negative for weakness, light-headedness, numbness and headaches.  Hematological: Negative for adenopathy. Does not bruise/bleed easily.  Psychiatric/Behavioral: Negative for dysphoric mood. The patient is not nervous/anxious.         Objective:   Physical Exam  Constitutional: He appears well-developed and well-nourished. No distress.  HENT:  Head: Normocephalic and atraumatic.  Right Ear: External ear normal.  Left Ear: External ear normal.  Mouth/Throat: Oropharynx is clear and moist. No oropharyngeal exudate.  Nares are injected and congested   No sinus tenderness TMs dull but clear  Eyes: Conjunctivae and EOM are normal. Pupils are equal, round,  and reactive to light. Right eye exhibits no discharge. Left eye exhibits no discharge.  Neck: Normal range of motion. Neck supple.  Cardiovascular: Normal rate, regular rhythm and normal heart sounds.   Pulmonary/Chest: Effort normal and breath sounds normal. No respiratory distress. He has no wheezes. He has no rales. He exhibits no tenderness.  Harsh hacking cough  Lymphadenopathy:    He has no cervical adenopathy.  Neurological: He is alert.  Skin: Skin is dry. No rash noted.  Psychiatric: He has a normal mood and affect.          Assessment & Plan:

## 2012-10-30 ENCOUNTER — Other Ambulatory Visit: Payer: Self-pay

## 2012-10-30 MED ORDER — LOSARTAN POTASSIUM 100 MG PO TABS: 100.0000 mg | ORAL_TABLET | Freq: Every day | ORAL | Status: DC

## 2012-10-30 NOTE — Telephone Encounter (Signed)
Pt request refill cozaar [facility]. Notified pt done.

## 2012-11-21 ENCOUNTER — Telehealth: Payer: Self-pay | Admitting: Family Medicine

## 2012-11-21 DIAGNOSIS — Z0000 Encounter for general adult medical examination without abnormal findings: Secondary | ICD-10-CM

## 2012-11-21 DIAGNOSIS — E785 Hyperlipidemia, unspecified: Secondary | ICD-10-CM

## 2012-11-21 NOTE — Telephone Encounter (Signed)
Message copied by Abner Greenspan on Thu Nov 21, 2012  4:41 PM ------      Message from: Marchia Bond      Created: Thu Nov 07, 2012 10:40 AM      Regarding: Cpx labs 5/9 Fri       Please order  future cpx labs for pt's upcoming lab appt.      Thanks      Tasha       ------

## 2012-11-21 NOTE — Telephone Encounter (Signed)
Let pt know when he comes for labs I did not order psa since I assume his urologist does - but if I am incorrect please add psa for prostate cancer screening

## 2012-11-22 ENCOUNTER — Other Ambulatory Visit (INDEPENDENT_AMBULATORY_CARE_PROVIDER_SITE_OTHER): Payer: BC Managed Care – PPO

## 2012-11-22 DIAGNOSIS — Z8349 Family history of other endocrine, nutritional and metabolic diseases: Secondary | ICD-10-CM

## 2012-11-22 DIAGNOSIS — R7989 Other specified abnormal findings of blood chemistry: Secondary | ICD-10-CM

## 2012-11-22 DIAGNOSIS — Z0000 Encounter for general adult medical examination without abnormal findings: Secondary | ICD-10-CM

## 2012-11-22 DIAGNOSIS — I10 Essential (primary) hypertension: Secondary | ICD-10-CM

## 2012-11-22 DIAGNOSIS — R7401 Elevation of levels of liver transaminase levels: Secondary | ICD-10-CM

## 2012-11-22 DIAGNOSIS — Z8489 Family history of other specified conditions: Secondary | ICD-10-CM

## 2012-11-22 DIAGNOSIS — E785 Hyperlipidemia, unspecified: Secondary | ICD-10-CM

## 2012-11-22 DIAGNOSIS — E291 Testicular hypofunction: Secondary | ICD-10-CM

## 2012-11-22 LAB — CBC WITH DIFFERENTIAL/PLATELET
Basophils Absolute: 0 10*3/uL (ref 0.0–0.1)
Eosinophils Absolute: 0.1 10*3/uL (ref 0.0–0.7)
Lymphocytes Relative: 26.3 % (ref 12.0–46.0)
MCHC: 34.6 g/dL (ref 30.0–36.0)
Monocytes Absolute: 0.6 10*3/uL (ref 0.1–1.0)
Neutrophils Relative %: 63.3 % (ref 43.0–77.0)
Platelets: 203 10*3/uL (ref 150.0–400.0)
RBC: 5.16 Mil/uL (ref 4.22–5.81)
RDW: 13.1 % (ref 11.5–14.6)

## 2012-11-22 LAB — COMPREHENSIVE METABOLIC PANEL
ALT: 76 U/L — ABNORMAL HIGH (ref 0–53)
AST: 41 U/L — ABNORMAL HIGH (ref 0–37)
Albumin: 4.2 g/dL (ref 3.5–5.2)
Alkaline Phosphatase: 64 U/L (ref 39–117)
Calcium: 9 mg/dL (ref 8.4–10.5)
Chloride: 105 mEq/L (ref 96–112)
Potassium: 3.5 mEq/L (ref 3.5–5.1)

## 2012-11-22 LAB — LIPID PANEL
HDL: 38.2 mg/dL — ABNORMAL LOW (ref >39.00)
LDL Cholesterol: 102 mg/dL — ABNORMAL HIGH (ref 0–99)
Total CHOL/HDL Ratio: 4

## 2012-11-22 LAB — TSH: TSH: 1.55 u[IU]/mL (ref 0.35–5.50)

## 2012-11-29 ENCOUNTER — Ambulatory Visit (INDEPENDENT_AMBULATORY_CARE_PROVIDER_SITE_OTHER): Payer: BC Managed Care – PPO | Admitting: Family Medicine

## 2012-11-29 ENCOUNTER — Encounter: Payer: Self-pay | Admitting: Family Medicine

## 2012-11-29 VITALS — BP 122/86 | HR 61 | Temp 98.4°F | Ht 69.0 in | Wt 213.8 lb

## 2012-11-29 DIAGNOSIS — R7401 Elevation of levels of liver transaminase levels: Secondary | ICD-10-CM

## 2012-11-29 DIAGNOSIS — Z0000 Encounter for general adult medical examination without abnormal findings: Secondary | ICD-10-CM

## 2012-11-29 DIAGNOSIS — E785 Hyperlipidemia, unspecified: Secondary | ICD-10-CM

## 2012-11-29 DIAGNOSIS — E669 Obesity, unspecified: Secondary | ICD-10-CM

## 2012-11-29 DIAGNOSIS — I10 Essential (primary) hypertension: Secondary | ICD-10-CM

## 2012-11-29 NOTE — Assessment & Plan Note (Signed)
Discussed how this problem influences overall health and the risks it imposes  Reviewed plan for weight loss with lower calorie diet (via better food choices and also portion control or program like weight watchers) and exercise building up to or more than 30 minutes 5 days per week including some aerobic activity    

## 2012-11-29 NOTE — Progress Notes (Signed)
Subjective:    Patient ID: Terry Dixon, male    DOB: January 30, 1961, 52 y.o.   MRN: [id number]  HPI Reviewed health habits including diet and exercise and skin cancer prevention Also reviewed health mt list, fam hx and immunizations    Is tired from working nights Doing ok overall   Wt is down 3 lb with bmi of 31 Has been working on his diet lately - also has almost cut out soft drinks to one per day   Flu vaccine 11/13  Td 10/10  colonosc 1/14 with 10 year recall since it was nl   Hx of fatty liver and inc transaminases Lab Results  Component Value Date   ALT 76* 11/22/2012   AST 41* 11/22/2012   ALKPHOS 64 11/22/2012   BILITOT 1.5* 11/22/2012   this persists    Diet- better  Exercise-- not doing it - but just got a gym membership (the [place])  He is motivated   Testosterone deficiency- has almost stopped his med  urol f/u - does his DRE and psa - last seen 2 months ago  Prostate  Lab Results  Component Value Date   PSA 0.64 11/12/2009   father had prostate enlargement but not cancer   Hyperlipidemia Lab Results  Component Value Date   CHOL 165 11/22/2012   CHOL 168 11/24/2011   CHOL 172 05/22/2011   Lab Results  Component Value Date   HDL 38.20* 11/22/2012   HDL 42.50 11/24/2011   HDL 40.00 05/22/2011   Lab Results  Component Value Date   LDLCALC 102* 11/22/2012   [place] 96 11/24/2011   LDLCALC 94 05/22/2011   Lab Results  Component Value Date   TRIG 125.0 11/22/2012   TRIG 147.0 11/24/2011   TRIG 189.0* 05/22/2011   Lab Results  Component Value Date   CHOLHDL 4 11/22/2012   CHOLHDL 4 11/24/2011   CHOLHDL 4 05/22/2011   Lab Results  Component Value Date   LDLDIRECT 115.6 11/17/2010   LDLDIRECT 121.8 07/17/2007   LDLDIRECT 100.5 01/03/2007   overall stable with dec in trig   bp is stable today  No cp or palpitations or headaches or edema  No side effects to medicines  BP Readings from Last 3 Encounters:  11/29/12 122/86  10/08/12 144/72  08/01/12 148/96       Patient Active Problem List   Diagnosis Date Noted  . Colon cancer screening 05/31/2012  . Low serum testosterone level 11/28/2011  . Routine general medical examination at a health care facility 11/23/2011  . Family history of non-anemic vitamin B12 deficiency 05/22/2011  . TRANSAMINASES, SERUM, ELEVATED 05/23/2010  . ERECTILE DYSFUNCTION 05/26/2008  . OBESITY 01/03/2007  . HYPERLIPIDEMIA 01/01/2007  . HYPERTENSION 01/01/2007  . DIVERTICULOSIS, COLON 01/01/2007   Past Medical History  Diagnosis Date  . Diverticulitis     colon   . Hyperlipidemia   . Obesity   . Hypertension   . ED (erectile dysfunction)   . Low testosterone    Past Surgical History  Procedure Laterality Date  . Vasectomy  1999  . Hernia repair  2000    R & L   History  Substance Use Topics  . Smoking status: Never Smoker   . Smokeless tobacco: Never Used     Comment: age 41 for 1 year   . Alcohol Use: Yes     Comment: 1 drink per month    Family History  Problem Relation Age of Onset  . Diabetes Mother   .  Benign prostatic hyperplasia Father   . Hypertension Father    Allergies  Allergen Reactions  . Dairy Aid (Lactase)    Current Outpatient Prescriptions on File Prior to Visit  Medication Sig Dispense Refill  . losartan (COZAAR) 100 MG tablet Take 1 tablet (100 mg total) by mouth daily.  90 tablet  0  . Multiple Vitamin (DAILY MULTIVITAMIN PO) Take by mouth daily.        . Omega-3 Fatty Acids (FISH OIL) 1200 MG CAPS Take by mouth daily.        . sildenafil (VIAGRA) 100 MG tablet Take 1 tablet (100 mg total) by mouth as directed.  10 tablet  3  . Testosterone (ANDROGEL PUMP) 1.25 GM/ACT (1%) GEL Place onto the skin as directed.         No current facility-administered medications on file prior to visit.      Review of Systems Review of Systems  Constitutional: Negative for fever, appetite change, fatigue and unexpected weight change.  Eyes: Negative for pain and visual disturbance.   Respiratory: Negative for cough and shortness of breath.   Cardiovascular: Negative for cp or palpitations    Gastrointestinal: Negative for nausea, diarrhea and constipation. neg for abd pain  Genitourinary: Negative for urgency and frequency.  Skin: Negative for pallor or rash   Neurological: Negative for weakness, light-headedness, numbness and headaches.  Hematological: Negative for adenopathy. Does not bruise/bleed easily.  Psychiatric/Behavioral: Negative for dysphoric mood. The patient is not nervous/anxious.         Objective:   Physical Exam  Constitutional: He appears well-developed and well-nourished. No distress.  obese and well appearing   HENT:  Head: Normocephalic and atraumatic.  Right Ear: External ear normal.  Left Ear: External ear normal.  Nose: Nose normal.  Mouth/Throat: Oropharynx is clear and moist.  Eyes: Conjunctivae and EOM are normal. Pupils are equal, round, and reactive to light. Right eye exhibits no discharge. No scleral icterus.  Neck: Normal range of motion. Neck supple. No JVD present. Carotid bruit is not present. No thyromegaly present.  Cardiovascular: Normal rate, regular rhythm, normal heart sounds and intact distal pulses.  Exam reveals no gallop.   Pulmonary/Chest: Effort normal and breath sounds normal. No respiratory distress. He has no wheezes. He has no rales.  Abdominal: Soft. Bowel sounds are normal. He exhibits no distension, no abdominal bruit and no mass. There is no tenderness.  Musculoskeletal: He exhibits no edema and no tenderness.  Lymphadenopathy:    He has no cervical adenopathy.  Neurological: He is alert. He has normal reflexes. No cranial nerve deficit. He exhibits normal muscle tone. Coordination normal.  Skin: Skin is warm and dry. No rash noted. No erythema. No pallor.  Psychiatric: He has a normal mood and affect.          Assessment & Plan:

## 2012-11-29 NOTE — Assessment & Plan Note (Addendum)
bp is stable today  No cp or palpitations or headaches or edema  No side effects to medicines  BP Readings from Last 3 Encounters:  11/29/12 122/86  10/08/12 144/72  08/01/12 148/96    bp in fair control at this time  No changes needed  Disc lifstyle change with low sodium diet and exercise

## 2012-11-29 NOTE — Assessment & Plan Note (Signed)
Reviewed health habits including diet and exercise and skin cancer prevention Also reviewed health mt list, fam hx and immunizations   Wellness labs rev in detail

## 2012-11-29 NOTE — Assessment & Plan Note (Signed)
Rev labs with pt in detail and adv again to loose weight for fatty liver  Disc what to avoid re: etoh and tylenol  F/u 6 mo

## 2012-11-29 NOTE — Patient Instructions (Addendum)
Take care of yourself  Get to the gym- our goal for exercise is 5 days per weeks at least 30 minutes -work up to that  Also - cut calories and reduce portions (fat and sugar )  Make a weight loss effort to help liver function  Weight watchers and there is an app called my fitness pal - that is very helpful and free  Follow up in 6 months

## 2012-11-29 NOTE — Assessment & Plan Note (Signed)
Disc goals for lipids and reasons to control them Rev labs with pt Rev low sat fat diet in detail  Is fairly controlled - will try to inc HDL with exercise

## 2013-02-17 ENCOUNTER — Other Ambulatory Visit: Payer: Self-pay | Admitting: Family Medicine

## 2013-04-18 ENCOUNTER — Other Ambulatory Visit: Payer: Self-pay | Admitting: Family Medicine

## 2013-05-28 ENCOUNTER — Telehealth: Payer: Self-pay | Admitting: Family Medicine

## 2013-05-28 DIAGNOSIS — R7401 Elevation of levels of liver transaminase levels: Secondary | ICD-10-CM

## 2013-05-28 NOTE — Telephone Encounter (Signed)
Message copied by Abner Greenspan on Wed May 28, 2013 10:12 PM ------      Message from: Ellamae Sia      Created: Tue May 20, 2013  4:41 PM      Regarding: Lab orders for Friday, 11.14.14       Lab orders for a 6 month f/u ------

## 2013-05-30 ENCOUNTER — Other Ambulatory Visit: Payer: BC Managed Care – PPO

## 2013-06-06 ENCOUNTER — Ambulatory Visit: Payer: BC Managed Care – PPO | Admitting: Family Medicine

## 2013-06-06 ENCOUNTER — Ambulatory Visit (INDEPENDENT_AMBULATORY_CARE_PROVIDER_SITE_OTHER): Payer: BC Managed Care – PPO | Admitting: Family Medicine

## 2013-06-06 ENCOUNTER — Encounter: Payer: Self-pay | Admitting: Family Medicine

## 2013-06-06 VITALS — BP 108/70 | HR 63 | Temp 98.1°F | Ht 69.0 in | Wt 216.5 lb

## 2013-06-06 DIAGNOSIS — E785 Hyperlipidemia, unspecified: Secondary | ICD-10-CM

## 2013-06-06 DIAGNOSIS — Z23 Encounter for immunization: Secondary | ICD-10-CM

## 2013-06-06 DIAGNOSIS — E669 Obesity, unspecified: Secondary | ICD-10-CM

## 2013-06-06 DIAGNOSIS — R7401 Elevation of levels of liver transaminase levels: Secondary | ICD-10-CM

## 2013-06-06 DIAGNOSIS — I10 Essential (primary) hypertension: Secondary | ICD-10-CM

## 2013-06-06 LAB — LIPID PANEL
Cholesterol: 173 mg/dL (ref 0–200)
HDL: 36.7 mg/dL — ABNORMAL LOW (ref >39.00)
LDL Cholesterol: 100 mg/dL — ABNORMAL HIGH (ref 0–99)
Total CHOL/HDL Ratio: 5
Triglycerides: 183 mg/dL — ABNORMAL HIGH (ref 0.0–149.0)
VLDL: 36.6 mg/dL (ref 0.0–40.0)

## 2013-06-06 LAB — COMPREHENSIVE METABOLIC PANEL
AST: 55 U/L — ABNORMAL HIGH (ref 0–37)
Albumin: 4 g/dL (ref 3.5–5.2)
Alkaline Phosphatase: 67 U/L (ref 39–117)
BUN: 12 mg/dL (ref 6–23)
Creatinine, Ser: 1 mg/dL (ref 0.4–1.5)
GFR: 86.6 mL/min (ref >60.00)
Glucose, Bld: 86 mg/dL (ref 70–99)
Potassium: 3.6 mEq/L (ref 3.5–5.1)
Sodium: 139 mEq/L (ref 135–145)
Total Bilirubin: 1.2 mg/dL (ref 0.3–1.2)

## 2013-06-06 NOTE — Progress Notes (Signed)
Subjective:    Patient ID: Terry Dixon, male    DOB: 1960/09/23, 52 y.o.   MRN: [id number]  HPI Here for f/u of chronic health problems   Allergies are bothering him  About 3 weeks Taking zyrtec as needed    Wt is up 3 lb with bmi of 31 Has a gym membership that does not get used  Working more and getting less rest - is tired  Is eating well - is trying to stay away from junk food  Has cut out soft drinks    Hx of obesity and fatty liver   bp is stable today  No cp or palpitations or headaches or edema  No side effects to medicines  BP Readings from Last 3 Encounters:  06/06/13 108/70  11/29/12 122/86  10/08/12 144/72     Flu vaccine - got it today  Due for labs -forgot to come in for that   In the past lost wt (30 lb ) - eating more but eating smarter  Patient Active Problem List   Diagnosis Date Noted  . Colon cancer screening 05/31/2012  . Low serum testosterone level 11/28/2011  . Routine general medical examination at a health care facility 11/23/2011  . Family history of non-anemic vitamin B12 deficiency 05/22/2011  . TRANSAMINASES, SERUM, ELEVATED 05/23/2010  . ERECTILE DYSFUNCTION 05/26/2008  . OBESITY 01/03/2007  . HYPERLIPIDEMIA 01/01/2007  . HYPERTENSION 01/01/2007  . DIVERTICULOSIS, COLON 01/01/2007   Past Medical History  Diagnosis Date  . Diverticulitis     colon   . Hyperlipidemia   . Obesity   . Hypertension   . ED (erectile dysfunction)   . Low testosterone    Past Surgical History  Procedure Laterality Date  . Vasectomy  1999  . Hernia repair  2000    R & L   History  Substance Use Topics  . Smoking status: Never Smoker   . Smokeless tobacco: Never Used     Comment: age 40 for 1 year   . Alcohol Use: No     Comment: rare   Family History  Problem Relation Age of Onset  . Diabetes Mother   . Benign prostatic hyperplasia Father   . Hypertension Father    Allergies  Allergen Reactions  . Dairy Aid [Lactase]     Current Outpatient Prescriptions on File Prior to Visit  Medication Sig Dispense Refill  . losartan (COZAAR) 100 MG tablet TAKE 1 TABLET (100 MG TOTAL) BY MOUTH DAILY.  90 tablet  2  . Multiple Vitamin (DAILY MULTIVITAMIN PO) Take by mouth daily.        . Omega-3 Fatty Acids (FISH OIL) 1200 MG CAPS Take by mouth daily.        . sildenafil (VIAGRA) 100 MG tablet Take 1 tablet (100 mg total) by mouth as directed.  10 tablet  3   No current facility-administered medications on file prior to visit.    Review of Systems Review of Systems  Constitutional: Negative for fever, appetite change, fatigue and unexpected weight change.  Eyes: Negative for pain and visual disturbance.  Respiratory: Negative for cough and shortness of breath.   Cardiovascular: Negative for cp or palpitations    Gastrointestinal: Negative for nausea, diarrhea and constipation. neg for abd pain  Genitourinary: Negative for urgency and frequency.  Skin: Negative for pallor or rash   Neurological: Negative for weakness, light-headedness, numbness and headaches.  Hematological: Negative for adenopathy. Does not bruise/bleed easily.  Psychiatric/Behavioral: Negative for  dysphoric mood. The patient is not nervous/anxious.         Objective:   Physical Exam  Constitutional: He appears well-developed and well-nourished. No distress.  obese and well appearing   HENT:  Head: Normocephalic and atraumatic.  Right Ear: External ear normal.  Left Ear: External ear normal.  Nose: Nose normal.  Mouth/Throat: Oropharynx is clear and moist.  Eyes: Conjunctivae and EOM are normal. Pupils are equal, round, and reactive to light. Right eye exhibits no discharge. Left eye exhibits no discharge. No scleral icterus.  Neck: Normal range of motion. Neck supple. No JVD present. Carotid bruit is not present. No thyromegaly present.  Cardiovascular: Normal rate, regular rhythm, normal heart sounds and intact distal pulses.  Exam reveals  no gallop.   Pulmonary/Chest: Effort normal and breath sounds normal. No respiratory distress. He has no wheezes. He exhibits no tenderness.  Abdominal: Soft. Bowel sounds are normal. He exhibits no distension, no abdominal bruit and no mass. There is no tenderness.  Musculoskeletal: He exhibits no edema and no tenderness.  Lymphadenopathy:    He has no cervical adenopathy.  Neurological: He is alert. He has normal reflexes. No cranial nerve deficit. He exhibits normal muscle tone. Coordination normal.  Skin: Skin is warm and dry. No rash noted. No erythema. No pallor.  Psychiatric: He has a normal mood and affect.          Assessment & Plan:

## 2013-06-06 NOTE — Patient Instructions (Signed)
Call your insurance company and see if they cover any nutritional intervention for obesity/ fatty liver/ high blood presssure Labs today  Try to get back to previous diet where you eat more but healthier/ vegetables  Flu shot today

## 2013-06-06 NOTE — Progress Notes (Signed)
Pre-visit discussion using our clinic review tool. No additional management support is needed unless otherwise documented below in the visit note.  

## 2013-06-08 NOTE — Assessment & Plan Note (Signed)
BP: 108/70 mmHg  bp in fair control at this time  No changes needed Disc lifstyle change with low sodium diet and exercise

## 2013-06-08 NOTE — Assessment & Plan Note (Signed)
Discussed how this problem influences overall health and the risks it imposes  Reviewed plan for weight loss with lower calorie diet (via better food choices and also portion control or program like weight watchers) and exercise building up to or more than 30 minutes 5 days per week including some aerobic activity   Stressed importance of wt loss

## 2013-06-08 NOTE — Assessment & Plan Note (Signed)
Lipid panel today Disc goals for lipids and reasons to control them Rev labs with pt from last draw  Rev low sat fat diet in detail

## 2013-06-08 NOTE — Assessment & Plan Note (Signed)
Lab today Fatty liver No symptoms  Stressed importance of wt loss

## 2013-06-13 ENCOUNTER — Encounter: Payer: Self-pay | Admitting: *Deleted

## 2013-06-20 ENCOUNTER — Other Ambulatory Visit: Payer: Self-pay | Admitting: Family Medicine

## 2013-06-20 NOTE — Telephone Encounter (Signed)
Electronic refill request, please advise  

## 2013-06-20 NOTE — Telephone Encounter (Signed)
Called pt and no answer, no voicemail

## 2013-06-20 NOTE — Telephone Encounter (Signed)
Since this is a controlled substance he needs to be seen if he needs it

## 2013-06-24 NOTE — Telephone Encounter (Signed)
Rx declined

## 2013-08-05 ENCOUNTER — Ambulatory Visit (INDEPENDENT_AMBULATORY_CARE_PROVIDER_SITE_OTHER): Payer: BC Managed Care – PPO | Admitting: Internal Medicine

## 2013-08-05 ENCOUNTER — Encounter: Payer: Self-pay | Admitting: Internal Medicine

## 2013-08-05 ENCOUNTER — Telehealth: Payer: Self-pay | Admitting: Family Medicine

## 2013-08-05 VITALS — BP 140/82 | HR 82 | Temp 98.1°F | Wt 218.0 lb

## 2013-08-05 DIAGNOSIS — R200 Anesthesia of skin: Secondary | ICD-10-CM

## 2013-08-05 DIAGNOSIS — K7581 Nonalcoholic steatohepatitis (NASH): Secondary | ICD-10-CM

## 2013-08-05 DIAGNOSIS — K7689 Other specified diseases of liver: Secondary | ICD-10-CM

## 2013-08-05 DIAGNOSIS — I739 Peripheral vascular disease, unspecified: Secondary | ICD-10-CM

## 2013-08-05 DIAGNOSIS — R209 Unspecified disturbances of skin sensation: Secondary | ICD-10-CM

## 2013-08-05 DIAGNOSIS — R202 Paresthesia of skin: Secondary | ICD-10-CM

## 2013-08-05 LAB — CBC WITH DIFFERENTIAL/PLATELET
BASOS ABS: 0 10*3/uL (ref 0.0–0.1)
Basophils Relative: 0.3 % (ref 0.0–3.0)
EOS ABS: 0.1 10*3/uL (ref 0.0–0.7)
Eosinophils Relative: 0.8 % (ref 0.0–5.0)
HCT: 46.6 % (ref 39.0–52.0)
Hemoglobin: 15.9 g/dL (ref 13.0–17.0)
LYMPHS PCT: 28.6 % (ref 12.0–46.0)
Lymphs Abs: 2.1 10*3/uL (ref 0.7–4.0)
MCHC: 34 g/dL (ref 30.0–36.0)
MCV: 87.9 fl (ref 78.0–100.0)
Monocytes Absolute: 0.5 10*3/uL (ref 0.1–1.0)
Monocytes Relative: 7.5 % (ref 3.0–12.0)
NEUTROS PCT: 62.8 % (ref 43.0–77.0)
Neutro Abs: 4.5 10*3/uL (ref 1.4–7.7)
Platelets: 197 10*3/uL (ref 150.0–400.0)
RBC: 5.3 Mil/uL (ref 4.22–5.81)
RDW: 13.3 % (ref 11.5–14.6)
WBC: 7.2 10*3/uL (ref 4.5–10.5)

## 2013-08-05 LAB — COMPREHENSIVE METABOLIC PANEL
ALBUMIN: 4 g/dL (ref 3.5–5.2)
ALT: 103 U/L — ABNORMAL HIGH (ref 0–53)
AST: 47 U/L — ABNORMAL HIGH (ref 0–37)
Alkaline Phosphatase: 78 U/L (ref 39–117)
BUN: 13 mg/dL (ref 6–23)
CHLORIDE: 107 meq/L (ref 96–112)
CO2: 26 mEq/L (ref 19–32)
Calcium: 9.2 mg/dL (ref 8.4–10.5)
Creatinine, Ser: 0.9 mg/dL (ref 0.4–1.5)
GFR: 93.16 mL/min (ref >60.00)
GLUCOSE: 83 mg/dL (ref 70–99)
POTASSIUM: 3.8 meq/L (ref 3.5–5.1)
Sodium: 140 mEq/L (ref 135–145)
TOTAL PROTEIN: 7 g/dL (ref 6.0–8.3)
Total Bilirubin: 0.5 mg/dL (ref 0.3–1.2)

## 2013-08-05 LAB — MAGNESIUM: Magnesium: 2 mg/dL (ref 1.5–2.5)

## 2013-08-05 LAB — TSH: TSH: 3.8 u[IU]/mL (ref 0.35–5.50)

## 2013-08-05 LAB — VITAMIN B12: Vitamin B-12: 333 pg/mL (ref 211–911)

## 2013-08-05 LAB — HEMOGLOBIN A1C: Hgb A1c MFr Bld: 6.1 % (ref 4.6–6.5)

## 2013-08-05 NOTE — Progress Notes (Signed)
Pre-visit discussion using our clinic review tool. No additional management support is needed unless otherwise documented below in the visit note.  

## 2013-08-05 NOTE — Telephone Encounter (Signed)
Patient Information:  Caller Name: Qu[place]ius  Phone: [phone]  Patient: Terry Dixon, Terry Dixon  Gender: Male  DOB: 1960/12/07  Age: 53 Years  PCP: [facility], [facility], [facility])  Office Follow Up:  Does the office need to follow up with this patient?: No  Instructions For The Office: N/A  RN Note:  Since New Years used Viagra and for the following 16 hours had leg pain and tingling. At that time, the pain in both legs was the same, especially in the calf area. He states he has chest pain vs acid reflux. It usually happens after a cup of coffee. Both legs are painful, right side is a little worse. He is on his feet 8 hours out of the 12 - works on his feet. His pain is MODERATE. No appointments available at the [facility] office, he agreed to be seen today, 08/05/2013 by Dr. Gilford Rile @ 11:15.   Symptoms  Reason For Call & Symptoms: On New Years Day took Viagra and legs/feet were throbbing and numb and lasted for about 16 hours. Legs are still uncomfortable and painful at times.  Reviewed Health History In EMR: Yes  Reviewed Medications In EMR: Yes  Reviewed Allergies In EMR: Yes  Reviewed Surgeries / Procedures: Yes  Date of Onset of Symptoms: 07/17/2013  Guideline(s) Used:  Leg Pain  Disposition Per Guideline:   See Today or Tomorrow in Office  Reason For Disposition Reached:   Numbness in a leg or foot (i.e., loss of sensation)  Advice Given:  Reassurance - Leg Pain  Here is some care advice that should help.  Pain Medicines:  For pain relief, you can take either acetaminophen, ibuprofen, or naproxen.  Call Back If:  Moderate pain (e.g., limping) lasts more than 3 days  Mild pain lasts more than 7 days  You become worse.  Patient Will Follow Care Advice:  YES  Appointment Scheduled:  08/05/2013 11:15:00 Appointment Scheduled Provider:  walker

## 2013-08-05 NOTE — Progress Notes (Signed)
Subjective:    Patient ID: Charlann Noss, male    DOB: 02/06/1961, 53 y.o.   MRN: [id number]  HPI 53YO male with obesity, hypertension and NASH presents for acute visit. Pt of Dr. Marliss Coots. Notes that Jan 1st, took Viagra 39m, developed pain described as cramping in calves bilaterally and numbness in feet. Gradually, symptoms improved over about 16hr. Had used Viagra for about 5 years with no problem. No known h/o PVD, diabetes. No new medications or supplements. Feet continue to feel "heavy" at times, particularly after standing for prolonged period. No pain or persistent numbness. No swelling in his legs. No weakness noted. Had recent upper respiratory infection and notes some chest pain with cough, but no persistent chest pain. URI symptoms have resolved.  Outpatient Encounter Prescriptions as of 08/05/2013  Medication Sig  . cetirizine (ZYRTEC) 10 MG tablet Take 10 mg by mouth daily as needed for allergies.  . DiphenhydrAMINE HCl, Sleep, (ZZZQUIL) 25 MG CAPS Take by mouth at bedtime as needed.  .Marland Kitchenlosartan (COZAAR) 100 MG tablet TAKE 1 TABLET (100 MG TOTAL) BY MOUTH DAILY.  .Marland KitchenMELATONIN MAXIMUM STRENGTH PO Take 10 mg by mouth at bedtime as needed.  . Multiple Vitamin (DAILY MULTIVITAMIN PO) Take by mouth daily.    . Omega-3 Fatty Acids (FISH OIL) 1200 MG CAPS Take by mouth daily.    . sildenafil (VIAGRA) 100 MG tablet Take 1 tablet (100 mg total) by mouth as directed.      Review of Systems  Constitutional: Negative for fever, chills, activity change, appetite change, fatigue and unexpected weight change.  Eyes: Negative for visual disturbance.  Respiratory: Negative for cough and shortness of breath.   Cardiovascular: Negative for chest pain, palpitations and leg swelling.  Gastrointestinal: Negative for abdominal pain and abdominal distention.  Genitourinary: Negative for dysuria, urgency and difficulty urinating.  Musculoskeletal: Negative for arthralgias and gait problem.    Skin: Negative for color change and rash.  Hematological: Negative for adenopathy.  Psychiatric/Behavioral: Negative for sleep disturbance and dysphoric mood. The patient is not nervous/anxious.    BP 140/82  Pulse 82  Temp(Src) 98.1 F (36.7 C) (Oral)  Wt 218 lb (98.884 kg)  SpO2 95%     Objective:   Physical Exam  Constitutional: He is oriented to person, place, and time. He appears well-developed and well-nourished. No distress.  HENT:  Head: Normocephalic and atraumatic.  Right Ear: External ear normal.  Left Ear: External ear normal.  Nose: Nose normal.  Mouth/Throat: Oropharynx is clear and moist. No oropharyngeal exudate.  Eyes: Conjunctivae and EOM are normal. Pupils are equal, round, and reactive to light. Right eye exhibits no discharge. Left eye exhibits no discharge. No scleral icterus.  Neck: Normal range of motion. Neck supple. No tracheal deviation present. No thyromegaly present.  Cardiovascular: Normal rate, regular rhythm and normal heart sounds.  Exam reveals no gallop and no friction rub.   No murmur heard. Pulmonary/Chest: Effort normal and breath sounds normal. No respiratory distress. He has no wheezes. He has no rales. He exhibits no tenderness.  Musculoskeletal: Normal range of motion. He exhibits no edema.       Right lower leg: He exhibits no tenderness and no swelling.       Left lower leg: He exhibits no tenderness and no swelling.  Lymphadenopathy:    He has no cervical adenopathy.  Neurological: He is alert and oriented to person, place, and time. He displays no atrophy and no tremor. No cranial  nerve deficit or sensory deficit (sensation intact bilateral LE to monofilament). He exhibits normal muscle tone. He displays no seizure activity. Coordination and gait normal.  Skin: Skin is warm and dry. No rash noted. He is not diaphoretic. No erythema. No pallor.  Psychiatric: He has a normal mood and affect. His behavior is normal. Judgment and thought  content normal.          Assessment & Plan:

## 2013-08-06 DIAGNOSIS — I739 Peripheral vascular disease, unspecified: Secondary | ICD-10-CM | POA: Insufficient documentation

## 2013-08-06 DIAGNOSIS — K7581 Nonalcoholic steatohepatitis (NASH): Secondary | ICD-10-CM | POA: Insufficient documentation

## 2013-08-06 DIAGNOSIS — R202 Paresthesia of skin: Secondary | ICD-10-CM

## 2013-08-06 DIAGNOSIS — R200 Anesthesia of skin: Secondary | ICD-10-CM | POA: Insufficient documentation

## 2013-08-06 NOTE — Assessment & Plan Note (Signed)
Unclear etiology for episode of BLE numbness. Exam normal today. Will check CBC, CMP, TSH B12 with labs. Will also screen urine heavy metals given pt works in [employer], contracting and uses well water. We discussed potential referral for EMG testing if recurrent symptoms and testing unrevealing.

## 2013-08-06 NOTE — Assessment & Plan Note (Signed)
Elevated LFTs noted on labs today. Reviewed previous notes and US abdomen which showed steatohepatitis. Recommended GI evaluation, as pt may ultimately need liver biopsy. May benefit from Vit A, statin. Encouraged high fiber, low fat, low sugar diet with goal of weight loss.

## 2013-08-06 NOTE — Assessment & Plan Note (Signed)
Described episode of cramping in bilateral LE most consistent with claudication. Exam including distal pulses normal today. Will set up vascular eval for ABIs and arterial doppler.

## 2013-08-11 ENCOUNTER — Other Ambulatory Visit: Payer: Self-pay | Admitting: *Deleted

## 2013-08-15 ENCOUNTER — Encounter: Payer: Self-pay | Admitting: *Deleted

## 2013-08-20 LAB — HEAVY METALS SCREEN, URINE
Arsenic, 24H Ur: 8 mcg/L (ref <81)
Lead, Urine (24 Hr): <1 mcg/L (ref <80)
Mercury 24 Hr Urine: <2 mcg/L (ref <21)

## 2013-08-21 ENCOUNTER — Ambulatory Visit: Payer: BC Managed Care – PPO | Admitting: Internal Medicine

## 2013-09-01 ENCOUNTER — Ambulatory Visit: Payer: BC Managed Care – PPO | Admitting: Internal Medicine

## 2013-09-05 ENCOUNTER — Encounter: Payer: Self-pay | Admitting: Internal Medicine

## 2013-09-12 ENCOUNTER — Ambulatory Visit: Payer: BC Managed Care – PPO | Admitting: Internal Medicine

## 2013-11-25 ENCOUNTER — Other Ambulatory Visit (INDEPENDENT_AMBULATORY_CARE_PROVIDER_SITE_OTHER): Payer: BC Managed Care – PPO

## 2013-11-25 DIAGNOSIS — R7402 Elevation of levels of lactic acid dehydrogenase (LDH): Secondary | ICD-10-CM

## 2013-11-25 DIAGNOSIS — R7401 Elevation of levels of liver transaminase levels: Secondary | ICD-10-CM

## 2013-11-25 DIAGNOSIS — R740 Nonspecific elevation of levels of transaminase and lactic acid dehydrogenase [LDH]: Principal | ICD-10-CM

## 2013-11-25 LAB — HEPATIC FUNCTION PANEL
ALBUMIN: 4.2 g/dL (ref 3.5–5.2)
ALT: 97 U/L — AB (ref 0–53)
AST: 57 U/L — ABNORMAL HIGH (ref 0–37)
Alkaline Phosphatase: 68 U/L (ref 39–117)
Bilirubin, Direct: 0.1 mg/dL (ref 0.0–0.3)
TOTAL PROTEIN: 7 g/dL (ref 6.0–8.3)
Total Bilirubin: 1.4 mg/dL — ABNORMAL HIGH (ref 0.2–1.2)

## 2013-11-28 ENCOUNTER — Other Ambulatory Visit: Payer: BC Managed Care – PPO

## 2013-12-05 ENCOUNTER — Ambulatory Visit (INDEPENDENT_AMBULATORY_CARE_PROVIDER_SITE_OTHER): Payer: BC Managed Care – PPO | Admitting: Family Medicine

## 2013-12-05 ENCOUNTER — Encounter: Payer: Self-pay | Admitting: Family Medicine

## 2013-12-05 VITALS — BP 132/78 | HR 61 | Temp 98.2°F | Ht 69.0 in | Wt 222.8 lb

## 2013-12-05 DIAGNOSIS — E1169 Type 2 diabetes mellitus with other specified complication: Secondary | ICD-10-CM | POA: Insufficient documentation

## 2013-12-05 DIAGNOSIS — R7309 Other abnormal glucose: Secondary | ICD-10-CM

## 2013-12-05 DIAGNOSIS — I10 Essential (primary) hypertension: Secondary | ICD-10-CM

## 2013-12-05 DIAGNOSIS — Z0000 Encounter for general adult medical examination without abnormal findings: Secondary | ICD-10-CM

## 2013-12-05 DIAGNOSIS — K7581 Nonalcoholic steatohepatitis (NASH): Secondary | ICD-10-CM

## 2013-12-05 DIAGNOSIS — E785 Hyperlipidemia, unspecified: Secondary | ICD-10-CM

## 2013-12-05 DIAGNOSIS — K7689 Other specified diseases of liver: Secondary | ICD-10-CM

## 2013-12-05 DIAGNOSIS — E669 Obesity, unspecified: Secondary | ICD-10-CM

## 2013-12-05 DIAGNOSIS — R739 Hyperglycemia, unspecified: Secondary | ICD-10-CM

## 2013-12-05 NOTE — Progress Notes (Signed)
Subjective:    Patient ID: Terry Dixon, male    DOB: Jul 21, 1960, 53 y.o.   MRN: [id number]  HPI Here for health maintenance exam and to review chronic medical problems    Wednesday he failed a breathing test as work - states it was a spirometry test (for wearing a respirator) "blew a 69" Then started wheezing  His allergies have been very bad   (he gets nasal allergies and wheezing both) Takes zyrtec - and that tends to help  Has not been dx with asthma   Jan 1st took a viagra - and he had leg pain for 8 hours  It got better  She went to the [place] office  Had ABI and arterial  He has not taken one since then   Wt is up 4 lb with bmi of 32 He has been gardening and mowing yard -but nothing regular in addition to that  Does not "overeat" he does eat a healthy diet - lots of fruit and veg / avoids fried foods and fatty foods  Tired all time -works very long hours --12 hour night shift    bp is stable today  No cp or palpitations or headaches or edema  No side effects to medicines  BP Readings from Last 3 Encounters:  12/05/13 132/78  08/05/13 140/82  06/06/13 108/70      Flu vaccine 11/14 Td 10/10  colonosc nl 1/14   urol f/u- he has not been back - he quit going there  Nocturia times one - no other symptoms - last prostate exam at least a year ago  Will follow from here  No prostate ca in family Lab Results  Component Value Date   PSA 0.64 11/12/2009     Fatty liver  It was suggested to him to see a liver specialist - was ref to [facility]  Lab Results  Component Value Date   ALT 97* 11/25/2013   AST 57* 11/25/2013   ALKPHOS 68 11/25/2013   BILITOT 1.4* 11/25/2013    Had A1C tested in Jan  Lab Results  Component Value Date   HGBA1C 6.1 08/05/2013   he knows he needs to loose weight   Lab Results  Component Value Date   CHOL 173 06/06/2013   HDL 36.70* 06/06/2013   LDLCALC 100* 06/06/2013   LDLDIRECT 115.6 11/17/2010   TRIG 183.0* 06/06/2013   CHOLHDL 5 06/06/2013   knows he needs to execise - HDL is low   Patient Active Problem List   Diagnosis Date Noted  . Hyperglycemia 12/05/2013  . Claudication 08/06/2013  . Numbness and tingling of both legs 08/06/2013  . Steatohepatitis, non-alcoholic 36/64/4034  . Colon cancer screening 05/31/2012  . Low serum testosterone level 11/28/2011  . Routine general medical examination at a health care facility 11/23/2011  . Family history of non-anemic vitamin B12 deficiency 05/22/2011  . TRANSAMINASES, SERUM, ELEVATED 05/23/2010  . ERECTILE DYSFUNCTION 05/26/2008  . OBESITY 01/03/2007  . HYPERLIPIDEMIA 01/01/2007  . HYPERTENSION 01/01/2007  . DIVERTICULOSIS, COLON 01/01/2007   Past Medical History  Diagnosis Date  . Diverticulitis     colon   . Hyperlipidemia   . Obesity   . Hypertension   . ED (erectile dysfunction)   . Low testosterone    Past Surgical History  Procedure Laterality Date  . Vasectomy  1999  . Hernia repair  2000    [facility]   History  Substance Use Topics  . Smoking status: Never  Smoker   . Smokeless tobacco: Never Used     Comment: age 7 for 1 year   . Alcohol Use: No     Comment: rare   Family History  Problem Relation Age of Onset  . Diabetes Mother   . Benign prostatic hyperplasia Father   . Hypertension Father    Allergies  Allergen Reactions  . Dairy Aid [Lactase]   . Viagra [Sildenafil Citrate]     Leg pain   Current Outpatient Prescriptions on File Prior to Visit  Medication Sig Dispense Refill  . cetirizine (ZYRTEC) 10 MG tablet Take 10 mg by mouth daily as needed for allergies.      . DiphenhydrAMINE HCl, Sleep, (ZZZQUIL) 25 MG CAPS Take by mouth at bedtime as needed.      Marland Kitchen losartan (COZAAR) 100 MG tablet TAKE 1 TABLET (100 MG TOTAL) BY MOUTH DAILY.  90 tablet  2  . MELATONIN MAXIMUM STRENGTH PO Take 10 mg by mouth at bedtime as needed.      . Multiple Vitamin (DAILY MULTIVITAMIN PO) Take by mouth daily.        . Omega-3 Fatty  Acids (FISH OIL) 1200 MG CAPS Take by mouth daily.         No current facility-administered medications on file prior to visit.    Review of Systems Review of Systems  Constitutional: Negative for fever, appetite change,  and unexpected weight change.  Eyes: Negative for pain and visual disturbance.  Respiratory: Negative for cough and shortness of breath.   Cardiovascular: Negative for cp or palpitations    Gastrointestinal: Negative for nausea, diarrhea and constipation. neg for abd pain  Genitourinary: Negative for urgency and frequency.  Skin: Negative for pallor or rash   Neurological: Negative for weakness, light-headedness, numbness and headaches.  Hematological: Negative for adenopathy. Does not bruise/bleed easily.  Psychiatric/Behavioral: Negative for dysphoric mood. The patient is not nervous/anxious.         Objective:   Physical Exam  Constitutional: He appears well-developed and well-nourished. No distress.  obese and well appearing   HENT:  Head: Normocephalic and atraumatic.  Right Ear: External ear normal.  Left Ear: External ear normal.  Nose: Nose normal.  Mouth/Throat: Oropharynx is clear and moist.  Eyes: Conjunctivae and EOM are normal. Pupils are equal, round, and reactive to light. Right eye exhibits no discharge. Left eye exhibits no discharge. No scleral icterus.  Neck: Normal range of motion. Neck supple. No JVD present. Carotid bruit is not present. No thyromegaly present.  Cardiovascular: Normal rate, regular rhythm, normal heart sounds and intact distal pulses.  Exam reveals no gallop.   Pulmonary/Chest: Effort normal and breath sounds normal. No respiratory distress. He has no wheezes. He exhibits no tenderness.  Abdominal: Soft. Bowel sounds are normal. He exhibits no distension, no abdominal bruit and no mass. There is no tenderness. There is no rebound and no guarding.  Genitourinary: Rectum normal. Prostate is enlarged. Prostate is not tender.    Prostate is very slt enlarged  Symmetric and firm and nt  Musculoskeletal: He exhibits no edema and no tenderness.  Lymphadenopathy:    He has no cervical adenopathy.  Neurological: He is alert. He has normal reflexes. No cranial nerve deficit. He exhibits normal muscle tone. Coordination normal.  Nl sensation in feet  Skin: Skin is warm and dry. No rash noted. No erythema. No pallor.  Psychiatric: He has a normal mood and affect.  Assessment & Plan:

## 2013-12-05 NOTE — Progress Notes (Signed)
Pre visit review using our clinic review tool, if applicable. No additional management support is needed unless otherwise documented below in the visit note. 

## 2013-12-05 NOTE — Patient Instructions (Signed)
Stop up front for referral to liver specialist  Try to exercise 5 days per week - work up to it Also - repeat your spirometry test at work - and if abnormal -please let me know and send me a copy- we may need to do some more extensive pulmonary function tests  Work on weight loss

## 2013-12-07 NOTE — Assessment & Plan Note (Signed)
Disc goals for lipids and reasons to control them Rev labs with pt Rev low sat fat diet in detail  Disc need to exercise to inc his HDL

## 2013-12-07 NOTE — Assessment & Plan Note (Signed)
Discussed how this problem influences overall health and the risks it imposes (incl fatty liver which he has) Reviewed plan for weight loss with lower calorie diet (via better food choices and also portion control or program like weight watchers) and exercise building up to or more than 30 minutes 5 days per week including some aerobic activity

## 2013-12-07 NOTE — Assessment & Plan Note (Signed)
Reviewed health habits including diet and exercise and skin cancer prevention Reviewed appropriate screening tests for age  Also reviewed health mt list, fam hx and immunization status , as well as social and family history   See HPI Labs reviewed  

## 2013-12-07 NOTE — Assessment & Plan Note (Signed)
Lab Results  Component Value Date   HGBA1C 6.1 08/05/2013   this is stable  Disc need to reduce starches in diet and loose wt to prev DM F/u 6 mo

## 2013-12-07 NOTE — Assessment & Plan Note (Signed)
Ref to hepatologist at [facility] as prev planned (appts were cancelled due to weather) Disc avoidance of hepatotoxic med and alcohol  Disc imp of wt loss

## 2013-12-07 NOTE — Assessment & Plan Note (Signed)
bp in fair control at this time  BP Readings from Last 1 Encounters:  12/05/13 132/78   No changes needed Disc lifstyle change with low sodium diet and exercise   Labs reviewed

## 2013-12-09 ENCOUNTER — Telehealth: Payer: Self-pay | Admitting: Family Medicine

## 2013-12-09 NOTE — Telephone Encounter (Signed)
Relevant patient education mailed to patient.  

## 2013-12-18 ENCOUNTER — Encounter: Payer: Self-pay | Admitting: Internal Medicine

## 2013-12-18 ENCOUNTER — Ambulatory Visit (INDEPENDENT_AMBULATORY_CARE_PROVIDER_SITE_OTHER): Payer: BC Managed Care – PPO | Admitting: Internal Medicine

## 2013-12-18 VITALS — BP 150/94 | HR 76 | Temp 98.0°F | Wt 223.8 lb

## 2013-12-18 DIAGNOSIS — R05 Cough: Secondary | ICD-10-CM

## 2013-12-18 DIAGNOSIS — J309 Allergic rhinitis, unspecified: Secondary | ICD-10-CM

## 2013-12-18 DIAGNOSIS — R059 Cough, unspecified: Secondary | ICD-10-CM

## 2013-12-18 MED ORDER — ALBUTEROL SULFATE HFA 108 (90 BASE) MCG/ACT IN AERS: 2.0000 | INHALATION_SPRAY | Freq: Four times a day (QID) | RESPIRATORY_TRACT | Status: DC | PRN

## 2013-12-18 MED ORDER — GUAIFENESIN ER 600 MG PO TB12: 600.0000 mg | ORAL_TABLET | Freq: Two times a day (BID) | ORAL | Status: DC

## 2013-12-18 NOTE — Patient Instructions (Addendum)
Allergic Rhinitis Allergic rhinitis is when the mucous membranes in the nose respond to allergens. Allergens are particles in the air that cause your body to have an allergic reaction. This causes you to release allergic antibodies. Through a chain of events, these eventually cause you to release histamine into the blood stream. Although meant to protect the body, it is this release of histamine that causes your discomfort, such as frequent sneezing, congestion, and an itchy, runny nose.  CAUSES  Seasonal allergic rhinitis (hay fever) is caused by pollen allergens that may come from grasses, trees, and weeds. Year-round allergic rhinitis (perennial allergic rhinitis) is caused by allergens such as house dust mites, pet dander, and mold spores.  SYMPTOMS   Nasal stuffiness (congestion).  Itchy, runny nose with sneezing and tearing of the eyes. DIAGNOSIS  Your health care provider can help you determine the allergen or allergens that trigger your symptoms. If you and your health care provider are unable to determine the allergen, skin or blood testing may be used. TREATMENT  Allergic Rhinitis does not have a cure, but it can be controlled by:  Medicines and allergy shots (immunotherapy).  Avoiding the allergen. Hay fever may often be treated with antihistamines in pill or nasal spray forms. Antihistamines block the effects of histamine. There are over-the-counter medicines that may help with nasal congestion and swelling around the eyes. Check with your health care provider before taking or giving this medicine.  If avoiding the allergen or the medicine prescribed do not work, there are many new medicines your health care provider can prescribe. Stronger medicine may be used if initial measures are ineffective. Desensitizing injections can be used if medicine and avoidance does not work. Desensitization is when a patient is given ongoing shots until the body becomes less sensitive to the allergen.  Make sure you follow up with your health care provider if problems continue. HOME CARE INSTRUCTIONS It is not possible to completely avoid allergens, but you can reduce your symptoms by taking steps to limit your exposure to them. It helps to know exactly what you are allergic to so that you can avoid your specific triggers. SEEK MEDICAL CARE IF:   You have a fever.  You develop a cough that does not stop easily (persistent).  You have shortness of breath.  You start wheezing.  Symptoms interfere with normal daily activities. Document Released: 03/28/2001 Document Revised: 04/23/2013 Document Reviewed: 03/10/2013 [employer] Patient Information 2014 [employer].  

## 2013-12-18 NOTE — Progress Notes (Signed)
Pre visit review using our clinic review tool, if applicable. No additional management support is needed unless otherwise documented below in the visit note. 

## 2013-12-18 NOTE — Progress Notes (Signed)
HPI  Pt presents to the clinic today with c/o cough and chest congestion. This has been going on for a few months. The cough is productive of clear mucous. He also reports itchy eyes and nasal congestion. He denies fever, chills or body aches. He does have a history of allergies. He does not take his zyrtec daily because he was told it would raise his blood pressure. He also reports he has failed spirometry x 2 for work. He had to perform spirometry to be fit tested for a mask to wear. On his first exam his FVC was 69, subsequently 64. He does not smoke.  Review of Systems      Past Medical History  Diagnosis Date  . Diverticulitis     colon   . Hyperlipidemia   . Obesity   . Hypertension   . ED (erectile dysfunction)   . Low testosterone     Family History  Problem Relation Age of Onset  . Diabetes Mother   . Benign prostatic hyperplasia Father   . Hypertension Father     History   Social History  . Marital Status: Divorced    Spouse Name: N/A    Number of Children: 1  . Years of Education: N/A   Occupational History  . [employer] (electrician)    Social History Main Topics  . Smoking status: Never Smoker   . Smokeless tobacco: Never Used     Comment: age 48 for 1 year   . Alcohol Use: No     Comment: rare  . Drug Use: No  . Sexual Activity: Not on file   Other Topics Concern  . Not on file   Social History Narrative   Regular exercise- yes, gym 2 x week, mnt. Biking 1 x week, aerobics 1 hr 2 x week.     Allergies  Allergen Reactions  . Dairy Aid [Lactase]   . Viagra [Sildenafil Citrate]     Leg pain     Constitutional:  Denies headache, fatigue, fever or abrupt weight changes.  HEENT:  Positive nasal congestion. Denies eye redness, eye pain, pressure behind the eyes, facial pain, ear pain, ringing in the ears, wax buildup, runny nose or bloody nose. Respiratory: Positive cough. Denies difficulty breathing or shortness of breath.  Cardiovascular:  Denies chest pain, chest tightness, palpitations or swelling in the hands or feet.   No other specific complaints in a complete review of systems (except as listed in HPI above).  Objective:   BP 150/94  Pulse 76  Temp(Src) 98 F (36.7 C) (Oral)  Wt 223 lb 12 oz (101.492 kg) Wt Readings from Last 3 Encounters:  12/18/13 223 lb 12 oz (101.492 kg)  12/05/13 222 lb 12 oz (101.039 kg)  08/05/13 218 lb (98.884 kg)     General: Appears his stated age, obese but well developed, well nourished in NAD. HEENT: Head: normal shape and size; Eyes: sclera white, no icterus, conjunctiva pink, PERRLA and EOMs intact; Ears: Tm's gray and intact, normal light reflex; Nose: mucosa pink and moist, septum midline; Throat/Mouth: + PND. Teeth present, mucosa erythematous and moist, no exudate noted, no lesions or ulcerations noted.  Neck: Neck supple, trachea midline. No massses, lumps or thyromegaly present.  Cardiovascular: Normal rate and rhythm. S1,S2 noted.  No murmur, rubs or gallops noted. No JVD or BLE edema. No carotid bruits noted. Pulmonary/Chest: Normal effort and positive vesicular breath sounds. No respiratory distress. No wheezes, rales or ronchi noted.  Assessment & Plan:   Allergic Rhintis:  Get some rest and drink plenty of water Advised him to take his zyrtec daily He would like a rX for mucinex Will also give eRx for albuterol  RTC as needed or if symptoms persist > 2 weeks

## 2013-12-26 ENCOUNTER — Other Ambulatory Visit: Payer: Self-pay | Admitting: *Deleted

## 2013-12-26 MED ORDER — LOSARTAN POTASSIUM 100 MG PO TABS: ORAL_TABLET | ORAL | Status: DC

## 2014-02-02 ENCOUNTER — Other Ambulatory Visit: Payer: Self-pay | Admitting: Nurse Practitioner

## 2014-02-02 DIAGNOSIS — C220 Liver cell carcinoma: Secondary | ICD-10-CM

## 2014-02-06 ENCOUNTER — Ambulatory Visit
Admission: RE | Admit: 2014-02-06 | Discharge: 2014-02-06 | Disposition: A | Discharge status: Home / Self Care | Payer: BC Managed Care – PPO | Source: Ambulatory Visit | Attending: Nurse Practitioner | Admitting: Nurse Practitioner

## 2014-02-06 DIAGNOSIS — C220 Liver cell carcinoma: Secondary | ICD-10-CM

## 2014-03-19 ENCOUNTER — Encounter: Payer: BC Managed Care – PPO | Attending: Nurse Practitioner | Admitting: *Deleted

## 2014-03-19 ENCOUNTER — Encounter: Payer: Self-pay | Admitting: *Deleted

## 2014-03-19 DIAGNOSIS — Z6833 Body mass index (BMI) 33.0-33.9, adult: Secondary | ICD-10-CM | POA: Diagnosis not present

## 2014-03-19 DIAGNOSIS — E785 Hyperlipidemia, unspecified: Secondary | ICD-10-CM | POA: Insufficient documentation

## 2014-03-19 DIAGNOSIS — E669 Obesity, unspecified: Secondary | ICD-10-CM | POA: Diagnosis present

## 2014-03-19 DIAGNOSIS — Z713 Dietary counseling and surveillance: Secondary | ICD-10-CM | POA: Diagnosis not present

## 2014-03-19 NOTE — Progress Notes (Signed)
  Medical Nutrition Therapy:  Appt start time: 1100 end time:  1200.  Assessment:  Primary concerns today: Terry Dixon is here for nutrition counseling pertaining to obesity.  He would like to lose weight.  He was referred to hepatology and was found to have fatty liver.   He works 12 hour night-shift, as does his wife. Juanjesus does the grocery shopping and cooking.  They use the crockpot a lot, bake, and broil.  They might go out to eat maybe once a week.  He typically eats 2 meals/day, but one of the meals is high in saturated fats and refined carbohydrates.   He is not physically active  Preferred Learning Style:   Auditory  Visual  Learning Readiness:  Ready   MEDICATIONS: see list   DIETARY INTAKE:  Usual eating pattern includes 2 meals and 0-1 snacks per day.  Everyday foods include proteins, starches, vegetables.  Avoided foods include dairy.    24-hr recall:  At work at night: bowl cabbage, peach In the morning: bacon, eggs, biscuit Goes to sleep form 10-5 pm Tries not to snack Beverages: water, coffee, tea, fruit juice.  No alcohol  Usual physical activity: none outside of ADLs  Estimated energy needs: 2000 calories 225 g carbohydrates 150 g protein 56 g fat    Nutritional Diagnosis:  NB-2.1 Physical inactivity As related to no activity outside of normal life.  As evidenced by patient self-report, obesity, and fatty liver disease.    Intervention:  Nutrition counseling provided.  Discussed saturated and trans fat, as well as refined carbohydrates and sugars.  Recommended limiting all these foods in favor of unsaturated fats and whole grains.  Recommended 3 meals/24 hours and to follow MyPlate recommendations for meal planning (small starch, lean protein, more vegetables and fruits).  Strongly emphasized need for regularly physical activity and brainstormed with patient what exercise he could realistically do in his schedule.    Teaching Method Utilized:    Visual  Auditory  Handouts given during visit include:  Medical Nutrition Therapy for high triglycerides and cholesterol  MyPlate  25O carbohydrate snack suggestions  Barriers to learning/adherence to lifestyle change: schedule (works night shifts) and being tired during the day  Demonstrated degree of understanding via:  Teach Back   Monitoring/Evaluation:  Dietary intake, exercise, and body weight in 3 month(s).

## 2014-03-19 NOTE — Patient Instructions (Signed)
Aim for 3 meals each day- follow MyPlate recommendations for meal planning Add meal around midnight: sandwich on wheat bread with fruit or vegetable soup, or leftovers Make some change to breakfast: try whole wheat toast, [employer] bacon, scrambled eggs, fruit or oatmeal with fruit Try soy milk (unsweetened) Aim to increase physical activity on days off: bike ride, walk, stationary bike, etc

## 2014-06-04 ENCOUNTER — Other Ambulatory Visit (INDEPENDENT_AMBULATORY_CARE_PROVIDER_SITE_OTHER): Payer: BC Managed Care – PPO

## 2014-06-04 DIAGNOSIS — E785 Hyperlipidemia, unspecified: Secondary | ICD-10-CM

## 2014-06-04 DIAGNOSIS — R739 Hyperglycemia, unspecified: Secondary | ICD-10-CM

## 2014-06-04 DIAGNOSIS — I10 Essential (primary) hypertension: Secondary | ICD-10-CM

## 2014-06-04 LAB — COMPREHENSIVE METABOLIC PANEL
ALT: 72 U/L — AB (ref 0–53)
AST: 35 U/L (ref 0–37)
Albumin: 4.1 g/dL (ref 3.5–5.2)
Alkaline Phosphatase: 66 U/L (ref 39–117)
BUN: 13 mg/dL (ref 6–23)
CALCIUM: 8.8 mg/dL (ref 8.4–10.5)
CO2: 24 meq/L (ref 19–32)
Chloride: 107 mEq/L (ref 96–112)
Creatinine, Ser: 1 mg/dL (ref 0.4–1.5)
GFR: 85.25 mL/min (ref >60.00)
Glucose, Bld: 88 mg/dL (ref 70–99)
POTASSIUM: 3.9 meq/L (ref 3.5–5.1)
SODIUM: 138 meq/L (ref 135–145)
TOTAL PROTEIN: 6.7 g/dL (ref 6.0–8.3)
Total Bilirubin: 0.9 mg/dL (ref 0.2–1.2)

## 2014-06-04 LAB — HEMOGLOBIN A1C: Hgb A1c MFr Bld: 6 % (ref 4.6–6.5)

## 2014-06-04 LAB — LIPID PANEL
CHOL/HDL RATIO: 4
Cholesterol: 147 mg/dL (ref 0–200)
HDL: 33.5 mg/dL — ABNORMAL LOW (ref >39.00)
LDL CALC: 85 mg/dL (ref 0–99)
NONHDL: 113.5
Triglycerides: 141 mg/dL (ref 0.0–149.0)
VLDL: 28.2 mg/dL (ref 0.0–40.0)

## 2014-06-08 ENCOUNTER — Ambulatory Visit: Payer: BC Managed Care – PPO | Admitting: Family Medicine

## 2014-06-08 ENCOUNTER — Encounter: Payer: Self-pay | Admitting: Family Medicine

## 2014-06-08 ENCOUNTER — Ambulatory Visit (INDEPENDENT_AMBULATORY_CARE_PROVIDER_SITE_OTHER): Payer: BC Managed Care – PPO | Admitting: Family Medicine

## 2014-06-08 VITALS — BP 114/70 | HR 55 | Temp 98.2°F | Ht 69.0 in | Wt 224.5 lb

## 2014-06-08 DIAGNOSIS — Z23 Encounter for immunization: Secondary | ICD-10-CM

## 2014-06-08 DIAGNOSIS — R7401 Elevation of levels of liver transaminase levels: Secondary | ICD-10-CM

## 2014-06-08 DIAGNOSIS — E785 Hyperlipidemia, unspecified: Secondary | ICD-10-CM

## 2014-06-08 DIAGNOSIS — R7402 Elevation of levels of lactic acid dehydrogenase (LDH): Secondary | ICD-10-CM

## 2014-06-08 DIAGNOSIS — E669 Obesity, unspecified: Secondary | ICD-10-CM

## 2014-06-08 DIAGNOSIS — R739 Hyperglycemia, unspecified: Secondary | ICD-10-CM

## 2014-06-08 DIAGNOSIS — R740 Nonspecific elevation of levels of transaminase and lactic acid dehydrogenase [LDH]: Secondary | ICD-10-CM

## 2014-06-08 DIAGNOSIS — I10 Essential (primary) hypertension: Secondary | ICD-10-CM

## 2014-06-08 NOTE — Progress Notes (Signed)
Subjective:    Patient ID: Terry Dixon, male    DOB: [birth year], 53 y.o.   MRN: [id number]  HPI Here for chronic medical problems   bp is stable today  No cp or palpitations or headaches or edema  No side effects to medicines  BP Readings from Last 3 Encounters:  06/08/14 114/70  12/18/13 150/94  12/05/13 132/78     Wt is stable with bmi of 33   Seeing liver specialists - being watched for steatohepatitis  Saw a dietician  Eating much better  Not exercising- plans to get a membership to the gym   Ast/aslt 35/72   Lab Results  Component Value Date   CHOL 147 06/04/2014   CHOL 173 06/06/2013   CHOL 165 11/22/2012   Lab Results  Component Value Date   HDL 33.50* 06/04/2014   HDL 36.70* 06/06/2013   HDL 38.20* 11/22/2012   Lab Results  Component Value Date   LDLCALC 85 06/04/2014   LDLCALC 100* 06/06/2013   LDLCALC 102* 11/22/2012   Lab Results  Component Value Date   TRIG 141.0 06/04/2014   TRIG 183.0* 06/06/2013   TRIG 125.0 11/22/2012   Lab Results  Component Value Date   CHOLHDL 4 06/04/2014   CHOLHDL 5 06/06/2013   CHOLHDL 4 11/22/2012   Lab Results  Component Value Date   LDLDIRECT 115.6 11/17/2010   LDLDIRECT 121.8 07/17/2007   LDLDIRECT 100.5 01/03/2007    Hyperglycemia  Lab Results  Component Value Date   HGBA1C 6.0 06/04/2014    Down from 6.1 Has not had a soft drink in over a year   Has also changed from dairy to soy milk  Also no ice cream      Also took a "breathing test" (for use of a respirator at work)- and failed it 3 times/once with allergies/ once with a head cold  An inhaler was not helpful  FVC- could not blow 70 three times in a row Has appt to ENT - at [facility] - (has had bad allergies all of his life)-had never been dx with asthma however   Brought spirometry results  Done by different techs  ? How reliable   Smoked briefly as a teen at most   Patient Active Problem List   Diagnosis Date Noted  .  Hyperglycemia 12/05/2013  . Claudication 08/06/2013  . Numbness and tingling of both legs 08/06/2013  . Steatohepatitis, non-alcoholic 40/98/1191  . Colon cancer screening 05/31/2012  . Low serum testosterone level 11/28/2011  . Routine general medical examination at a health care facility 11/23/2011  . Family history of non-anemic vitamin B12 deficiency 05/22/2011  . TRANSAMINASES, SERUM, ELEVATED 05/23/2010  . ERECTILE DYSFUNCTION 05/26/2008  . OBESITY 01/03/2007  . HYPERLIPIDEMIA 01/01/2007  . HYPERTENSION 01/01/2007  . DIVERTICULOSIS, COLON 01/01/2007   Past Medical History  Diagnosis Date  . Diverticulitis     colon   . Hyperlipidemia   . Obesity   . Hypertension   . ED (erectile dysfunction)   . Low testosterone    Past Surgical History  Procedure Laterality Date  . Vasectomy  1999  . Hernia repair  2000    R & L   History  Substance Use Topics  . Smoking status: Never Smoker   . Smokeless tobacco: Never Used     Comment: age 80 for 1 year   . Alcohol Use: No     Comment: rare   Family History  Problem Relation Age of  Onset  . Diabetes Mother   . COPD Mother   . Benign prostatic hyperplasia Father   . Hypertension Father    Allergies  Allergen Reactions  . Dairy Aid [Lactase]   . Viagra [Sildenafil Citrate]     Leg pain   Current Outpatient Prescriptions on File Prior to Visit  Medication Sig Dispense Refill  . albuterol (PROVENTIL HFA;VENTOLIN HFA) 108 (90 BASE) MCG/ACT inhaler Inhale 2 puffs into the lungs every 6 (six) hours as needed for wheezing or shortness of breath. 1 Inhaler 0  . cetirizine (ZYRTEC) 10 MG tablet Take 10 mg by mouth daily as needed for allergies.    . DiphenhydrAMINE HCl, Sleep, (ZZZQUIL) 25 MG CAPS Take by mouth at bedtime as needed.    Marland Kitchen guaiFENesin (MUCINEX) 600 MG 12 hr tablet Take 1 tablet (600 mg total) by mouth 2 (two) times daily. 60 tablet 0  . losartan (COZAAR) 100 MG tablet TAKE 1 TABLET (100 MG TOTAL) BY MOUTH DAILY.  90 tablet 2  . MELATONIN MAXIMUM STRENGTH PO Take 10 mg by mouth at bedtime as needed.    . Multiple Vitamin (DAILY MULTIVITAMIN PO) Take by mouth daily.      . Omega-3 Fatty Acids (FISH OIL) 1200 MG CAPS Take by mouth daily.       No current facility-administered medications on file prior to visit.    Review of Systems Review of Systems  Constitutional: Negative for fever, appetite change, fatigue and unexpected weight change.  Eyes: Negative for pain and visual disturbance.  Respiratory: Negative for cough and shortness of breath.   Cardiovascular: Negative for cp or palpitations    Gastrointestinal: Negative for nausea, diarrhea and constipation.  Genitourinary: Negative for urgency and frequency.  Skin: Negative for pallor or rash   Neurological: Negative for weakness, light-headedness, numbness and headaches.  Hematological: Negative for adenopathy. Does not bruise/bleed easily.  Psychiatric/Behavioral: Negative for dysphoric mood. The patient is not nervous/anxious.         Objective:   Physical Exam  Constitutional: He appears well-developed and well-nourished. No distress.  obese and well appearing   HENT:  Head: Normocephalic and atraumatic.  Mouth/Throat: Oropharynx is clear and moist.  Eyes: Conjunctivae and EOM are normal. Pupils are equal, round, and reactive to light. No scleral icterus.  Neck: Normal range of motion. Neck supple. No JVD present. Carotid bruit is not present. No thyromegaly present.  Cardiovascular: Normal rate, regular rhythm, normal heart sounds and intact distal pulses.  Exam reveals no gallop.   Pulmonary/Chest: Effort normal and breath sounds normal. No respiratory distress. He has no wheezes. He exhibits no tenderness.  Abdominal: Soft. Bowel sounds are normal. He exhibits no distension, no abdominal bruit and no mass. There is no tenderness.  Musculoskeletal: He exhibits no edema or tenderness.  Lymphadenopathy:    He has no cervical  adenopathy.  Neurological: He is alert. He has normal reflexes. No cranial nerve deficit. He exhibits normal muscle tone. Coordination normal.  Skin: Skin is warm and dry. No rash noted. No erythema. No pallor.  Psychiatric: He has a normal mood and affect.          Assessment & Plan:   Problem List Items Addressed This Visit      Cardiovascular and Mediastinum   Essential hypertension    bp in fair control at this time  BP Readings from Last 1 Encounters:  06/08/14 114/70   No changes needed Disc lifstyle change with low sodium diet and  exercise  Enc to keep working on exercise      Other   Hyperglycemia    Lab Results  Component Value Date   HGBA1C 6.0 06/04/2014   Stressed imp of low glycemic diet and weight loss to prevent DM  F/u with lab in 3 mo     Hyperlipidemia    Disc goals for lipids and reasons to control them Rev labs with pt Rev low sat fat diet in detail      Obesity    Discussed how this problem influences overall health and the risks it imposes  Reviewed plan for weight loss with lower calorie diet (via better food choices and also portion control or program like weight watchers) and exercise building up to or more than 30 minutes 5 days per week including some aerobic activity       TRANSAMINASES, SERUM, ELEVATED    Starting to improve with diet change for fatty liver Will continue f/u with liver specialist Wt loss advised  inst to avoid etoh and hepatotoxic medicines     Other Visit Diagnoses    Need for prophylactic vaccination and inoculation against influenza    -  Primary    Relevant Orders       Flu Vaccine QUAD 36+ mos PF IM (Fluarix Quad PF) (Completed)

## 2014-06-08 NOTE — Assessment & Plan Note (Signed)
Lab Results  Component Value Date   HGBA1C 6.0 06/04/2014   Stressed imp of low glycemic diet and weight loss to prevent DM  F/u with lab in 3 mo

## 2014-06-08 NOTE — Assessment & Plan Note (Signed)
Discussed how this problem influences overall health and the risks it imposes  Reviewed plan for weight loss with lower calorie diet (via better food choices and also portion control or program like weight watchers) and exercise building up to or more than 30 minutes 5 days per week including some aerobic activity    

## 2014-06-08 NOTE — Assessment & Plan Note (Signed)
bp in fair control at this time  BP Readings from Last 1 Encounters:  06/08/14 114/70   No changes needed Disc lifstyle change with low sodium diet and exercise  Enc to keep working on exercise

## 2014-06-08 NOTE — Patient Instructions (Signed)
Liver tests look a little better  Keep working on healthy diet  Add exercise when you can  See ENT about breathing as planned  A1C (sugar) is also a bit improved   Follow up after May 22 for annual exam with labs prior

## 2014-06-08 NOTE — Progress Notes (Signed)
Pre visit review using our clinic review tool, if applicable. No additional management support is needed unless otherwise documented below in the visit note. 

## 2014-06-08 NOTE — Assessment & Plan Note (Signed)
Starting to improve with diet change for fatty liver Will continue f/u with liver specialist Wt loss advised  inst to avoid etoh and hepatotoxic medicines

## 2014-06-08 NOTE — Assessment & Plan Note (Signed)
Disc goals for lipids and reasons to control them Rev labs with pt Rev low sat fat diet in detail   

## 2014-06-18 ENCOUNTER — Ambulatory Visit: Payer: BC Managed Care – PPO | Admitting: *Deleted

## 2014-07-14 ENCOUNTER — Telehealth: Payer: Self-pay

## 2014-07-14 NOTE — Telephone Encounter (Signed)
pt left v/m; requesting substitute med for viagra; pt spoke with pharmacy and viagra refills had expired. Pt said if cannot get small dosage of another med to substitute for viagra pt would like viagra refilled [facility].Please advise. Pt last seen 06/08/14.

## 2014-07-14 NOTE — Telephone Encounter (Signed)
Chart lists side eff to viagra of leg pain  Has he tried cialis or levitra?  Does he know if his insurance covers either of these ? - let me know

## 2014-07-15 MED ORDER — TADALAFIL 5 MG PO TABS
5.0000 mg | ORAL_TABLET | Freq: Every day | ORAL | Status: DC | PRN
Start: 2014-07-15 — End: 2014-12-09

## 2014-07-15 NOTE — Telephone Encounter (Signed)
Sent to pharmacy 

## 2014-07-15 NOTE — Telephone Encounter (Signed)
Pt said he did have leg pain from the viagra so he would like to try the lowest dose of cialis, pt's insurance will cover cialis and if they don't he will pay out of pocket, pt request Rx sent to [facility]

## 2014-07-15 NOTE — Telephone Encounter (Signed)
Left voicemail requesting pt to call office 

## 2014-07-16 NOTE — Telephone Encounter (Signed)
Pt left v/m for update of rx. [facility] both lines remain busy after several attempts to call; pt notified refilled 07/15/14 and pt will contact pharmacy.

## 2014-09-03 ENCOUNTER — Encounter: Payer: Self-pay | Admitting: Family Medicine

## 2014-09-03 ENCOUNTER — Ambulatory Visit (INDEPENDENT_AMBULATORY_CARE_PROVIDER_SITE_OTHER): Payer: 59 | Admitting: Family Medicine

## 2014-09-03 VITALS — BP 140/70 | HR 70 | Temp 98.0°F | Ht 63.0 in | Wt 225.8 lb

## 2014-09-03 DIAGNOSIS — J069 Acute upper respiratory infection, unspecified: Secondary | ICD-10-CM | POA: Insufficient documentation

## 2014-09-03 DIAGNOSIS — B9789 Other viral agents as the cause of diseases classified elsewhere: Principal | ICD-10-CM

## 2014-09-03 NOTE — Patient Instructions (Signed)
Viral upper respiratory tract infection. Stop albuterol. Use only if coughing fit pr wheeze/shortness of breath. At night take cheratussin. During the day use benzonatate for cough as needed. Only use mucinex or sudafed if very congested. Rest, fluids. Call if not continuing to improve.   I

## 2014-09-03 NOTE — Progress Notes (Signed)
   Subjective:    Patient ID: Terry Dixon, male    DOB: Oct 07, 1960, 55 y.o.   MRN: [id number]  Cough This is a new problem. The current episode started in the past 7 days (went to urgent care on MOnday: Dx with virus.). The problem has been gradually improving (felt worse yesterday but better today). The problem occurs constantly. The cough is productive of sputum. Associated symptoms include nasal congestion and a sore throat. Pertinent negatives include no chills, ear congestion or shortness of breath. Associated symptoms comments: No sinus pain and pressure. Nothing aggravates the symptoms. Risk factors: nonsmoker. He has tried prescription cough suppressant and a beta-agonist inhaler (mucinex DM and pseudoephdrine, benzonatate ) for the symptoms. The treatment provided moderate relief. His past medical history is significant for environmental allergies. There is no history of asthma, bronchiectasis, bronchitis, COPD, emphysema or pneumonia.   At med fast: Given cheratussin, albuterol prn.   Review of Systems  Constitutional: Negative for chills.  HENT: Positive for sore throat.   Respiratory: Positive for cough. Negative for shortness of breath.   Allergic/Immunologic: Positive for environmental allergies.       Objective:   Physical Exam  Constitutional: Vital signs are normal. He appears well-developed and well-nourished.  Non-toxic appearance. He does not appear ill. No distress.  HENT:  Head: Normocephalic and atraumatic.  Right Ear: Hearing, tympanic membrane, external ear and ear canal normal. No tenderness. No foreign bodies. Tympanic membrane is not retracted and not bulging.  Left Ear: Hearing, tympanic membrane, external ear and ear canal normal. No tenderness. No foreign bodies. Tympanic membrane is not retracted and not bulging.  Nose: Nose normal. No mucosal edema or rhinorrhea. Right sinus exhibits no maxillary sinus tenderness and no frontal sinus tenderness. Left  sinus exhibits no maxillary sinus tenderness and no frontal sinus tenderness.  Mouth/Throat: Uvula is midline, oropharynx is clear and moist and mucous membranes are normal. Normal dentition. No dental caries. No oropharyngeal exudate or tonsillar abscesses.  Eyes: Conjunctivae, EOM and lids are normal. Pupils are equal, round, and reactive to light. Lids are everted and swept, no foreign bodies found.  Neck: Trachea normal, normal range of motion and phonation normal. Neck supple. Carotid bruit is not present. No thyroid mass and no thyromegaly present.  Cardiovascular: Normal rate, regular rhythm, S1 normal, S2 normal, normal heart sounds, intact distal pulses and normal pulses.  Exam reveals no gallop.   No murmur heard. Pulmonary/Chest: Effort normal and breath sounds normal. No respiratory distress. He has no wheezes. He has no rhonchi. He has no rales.  Abdominal: Soft. Normal appearance and bowel sounds are normal. There is no hepatosplenomegaly. There is no tenderness. There is no rebound, no guarding and no CVA tenderness. No hernia.  Neurological: He is alert. He has normal reflexes.  Skin: Skin is warm, dry and intact. No rash noted.  Psychiatric: He has a normal mood and affect. His speech is normal and behavior is normal. Judgment normal.          Assessment & Plan:

## 2014-09-03 NOTE — Assessment & Plan Note (Signed)
Resolving. Reviewed meds and how to use them for symptom care.

## 2014-09-03 NOTE — Progress Notes (Signed)
Pre visit review using our clinic review tool, if applicable. No additional management support is needed unless otherwise documented below in the visit note. 

## 2014-09-08 ENCOUNTER — Other Ambulatory Visit: Payer: Self-pay | Admitting: Family Medicine

## 2014-10-01 ENCOUNTER — Telehealth: Payer: Self-pay | Admitting: Cardiovascular Disease

## 2014-10-01 NOTE — Telephone Encounter (Signed)
Pt is aware that he was prescribed Losartan 150m. He will contact office tomorrow to schedule future appointment.

## 2014-10-01 NOTE — Telephone Encounter (Signed)
Patient was seen in 2013.  Have questions about some medicines he was taking.  Patient wants to know what starting / original dosage of cozar used to be.

## 2014-10-21 ENCOUNTER — Ambulatory Visit (INDEPENDENT_AMBULATORY_CARE_PROVIDER_SITE_OTHER): Payer: 59 | Admitting: Cardiovascular Disease

## 2014-10-21 ENCOUNTER — Encounter (INDEPENDENT_AMBULATORY_CARE_PROVIDER_SITE_OTHER): Payer: Self-pay

## 2014-10-21 ENCOUNTER — Encounter: Payer: Self-pay | Admitting: Cardiovascular Disease

## 2014-10-21 VITALS — BP 140/78 | HR 55 | Ht 70.0 in | Wt 222.5 lb

## 2014-10-21 DIAGNOSIS — K7581 Nonalcoholic steatohepatitis (NASH): Secondary | ICD-10-CM

## 2014-10-21 DIAGNOSIS — R740 Nonspecific elevation of levels of transaminase and lactic acid dehydrogenase [LDH]: Secondary | ICD-10-CM

## 2014-10-21 DIAGNOSIS — I10 Essential (primary) hypertension: Secondary | ICD-10-CM | POA: Diagnosis not present

## 2014-10-21 DIAGNOSIS — E785 Hyperlipidemia, unspecified: Secondary | ICD-10-CM

## 2014-10-21 DIAGNOSIS — R7401 Elevation of levels of liver transaminase levels: Secondary | ICD-10-CM

## 2014-10-21 NOTE — Patient Instructions (Signed)
You are doing well. No medication changes were made.  Try to keep losing weight to improve the liver numbers  Please call us if you have new issues that need to be addressed before your next appt.  Your physician wants you to follow-up in: 12 months.  You will receive a reminder letter in the mail two months in advance. If you don't receive a letter, please call our office to schedule the follow-up appointment.

## 2014-10-21 NOTE — Assessment & Plan Note (Signed)
We have discussed with him that losartan is likely not contributing to his elevated LFTs. Elevated LFTs were seen prior to starting losartan every 1007 Certainly we could change the medication if he would like. He has elected to continue on losartan

## 2014-10-21 NOTE — Assessment & Plan Note (Signed)
We have discussed the possible outcome of fatty liver and possibility of cirrhosis at a later date. Recommended he increase his exercise, modify his diet. Currently works 12 hours per day but only 3-4 days per week. Recommended he start a walking program

## 2014-10-21 NOTE — Assessment & Plan Note (Signed)
Likely secondary to NASH. Recommended weight loss, dietary restriction. Numbers improved November 2015 with weight loss.  Also associated with lower cholesterol at that time Elevated numbers seen dating back to 2008 in our system

## 2014-10-21 NOTE — Progress Notes (Signed)
Patient ID: Terry Dixon, male    DOB: Aug 01, 1960, 54 y.o.   MRN: [id number]  HPI Comments: Mr. Robey is a 54 year old gentleman with a history of low HDL, obesity, hypertension,  elevated LFTs likely secondary to Karlene Lineman,  previously evaluated in 2011 for chest pain and an abnormal EKG, presenting for discussion of his elevated LFTs. Previous problems with erectile dysfunction and takes Viagra or cialis.   In follow-up today, he reports that he feels well overall. Denies any significant shortness of breath or chest pain with exertion. He was reading the side effect profile of losartan and read that this could cause liver issues. He has been told his liver enzymes are elevated and needs to lose weight. Reports his blood pressures typically well controlled. Otherwise has no complaints. He works 12 hour shifts, 4 days one week, 3 days the next week  EKG on today's visit shows sinus rhythm with rate 55 bpm, right bundle branch block Most recent lab work reviewed with him showing AST 45, ALT 80 Prior lab work also reviewed with him. In November 2015, AST ALT had improved but his weight was lower by at least 6 pounds.  cholesterol was also lower in November 2015 after weight loss, achieved total cholesterol in the 140 range, Typically 160-170   Other past medical history  In 2011, ate a sloppy Joe for breakfast at approximately 5 or 6 in the morning. He developed chest pain that lasted approximately 5-6 hours through the course of the morning. After several hours of chest discomfort, he presented to his boss at work and told him about his discomfort. He was referred for an EKG and as this was abnormal was sent to [facility]. There he received an aspirin, was monitored on telemetry 4 hours and after contact was made with our cardiology, followup was made in clinic.  Since his discharge from the emergency room, his had no further episodes of chest pain. 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 He denies shortness of breath, lightheadedness, dizziness.   Allergies  Allergen Reactions  . Dairy Aid [Lactase]   . Viagra [Sildenafil Citrate]     Leg pain    Outpatient Encounter Prescriptions as of 10/21/2014  Medication Sig  . albuterol (PROVENTIL HFA;VENTOLIN HFA) 108 (90 BASE) MCG/ACT inhaler Inhale 2 puffs into the lungs every 6 (six) hours as needed for wheezing or shortness of breath.  . benzonatate (TESSALON) 200 MG capsule Take 200 mg by mouth 3 (three) times daily as needed for cough.  . cetirizine (ZYRTEC) 10 MG tablet Take 10 mg by mouth daily as needed for allergies.  . DiphenhydrAMINE HCl, Sleep, (ZZZQUIL) 25 MG CAPS Take by mouth at bedtime as needed.  . DULERA 100-5 MCG/ACT AERO   . EPIPEN 2-PAK 0.3 MG/0.3ML SOAJ injection See admin instructions.  . fluticasone (FLONASE) 50 MCG/ACT nasal spray   . guaiFENesin (MUCINEX) 600 MG 12 hr tablet Take 1 tablet (600 mg total) by mouth 2 (two) times daily.  Marland Kitchen guaiFENesin-codeine (ROBITUSSIN AC) 100-10 MG/5ML syrup Take 5 mLs by mouth 3 (three) times daily as needed for cough.  . losartan (COZAAR) 100 MG tablet TAKE 1 TABLET (100 MG TOTAL) BY MOUTH DAILY.  Marland Kitchen MELATONIN MAXIMUM STRENGTH PO Take 10 mg by mouth at bedtime as needed.  . Multiple Vitamin (DAILY MULTIVITAMIN PO) Take by mouth daily.    . Omega-3 Fatty Acids (FISH OIL)  1200 MG CAPS Take by mouth daily.    . pseudoephedrine (SUDAFED) 120 MG 12 hr tablet Take 120 mg by mouth 2 (two) times daily.  . tadalafil (CIALIS) 5 MG tablet Take 1 tablet (5 mg total) by mouth daily as needed for erectile dysfunction.    Past Medical History  Diagnosis Date  . Diverticulitis     colon   . Hyperlipidemia   . Obesity   . Hypertension   . ED (erectile dysfunction)   . Low testosterone     Past Surgical History  Procedure Laterality Date  . Vasectomy  1999  . Hernia repair  2000    [facility]    Social History   reports that he has never smoked. He has never used smokeless tobacco. He reports that he does not drink alcohol or use illicit drugs.  Family History family history includes Benign prostatic hyperplasia in his father; COPD in his mother; Diabetes in his mother; Hypertension in his father.     Review of Systems  Constitutional: Negative.   Respiratory: Negative.   Cardiovascular: Negative.   Gastrointestinal: Negative.   Musculoskeletal: Negative.   Skin: Negative.   Neurological: Negative.   Hematological: Negative.   Psychiatric/Behavioral: Negative.   All other systems reviewed and are negative.   BP 140/78 mmHg  Pulse 55  Ht 5' 10"  (1.778 m)  Wt 222 lb 8 oz (100.925 kg)  BMI 31.93 kg/m2  Physical Exam  Constitutional: He is oriented to person, place, and time. He appears well-developed and well-nourished.  Obese  HENT:  Head: Normocephalic.  Nose: Nose normal.  Mouth/Throat: Oropharynx is clear and moist.  Eyes: Conjunctivae are normal. Pupils are equal, round, and reactive to light.  Neck: Normal range of motion. Neck supple. No JVD present.  Cardiovascular: Normal rate, regular rhythm, S1 normal, S2 normal, normal heart sounds and intact distal pulses.  Exam reveals no gallop and no friction rub.   No murmur heard. Pulmonary/Chest: Effort normal and breath sounds normal. No respiratory distress. He has no wheezes. He has no rales. He exhibits no tenderness.  Abdominal: Soft. Bowel sounds are normal. He exhibits no distension. There is no tenderness.  Musculoskeletal: Normal range of motion. He exhibits no edema or tenderness.  Lymphadenopathy:    He has no cervical adenopathy.  Neurological: He is alert and oriented to person, place, and time. Coordination normal.  Skin: Skin is warm and dry. No rash noted. No erythema.  Psychiatric: He has a normal mood and affect. His behavior is normal. Judgment and thought content normal.      Assessment and Plan    Nursing note and vitals reviewed.

## 2014-10-21 NOTE — Assessment & Plan Note (Signed)
Recommended weight loss. Total cholesterol 148 when his weight was lower in 2015

## 2014-12-02 ENCOUNTER — Other Ambulatory Visit (INDEPENDENT_AMBULATORY_CARE_PROVIDER_SITE_OTHER): Payer: 59

## 2014-12-02 ENCOUNTER — Telehealth: Payer: Self-pay | Admitting: Family Medicine

## 2014-12-02 DIAGNOSIS — Z0000 Encounter for general adult medical examination without abnormal findings: Secondary | ICD-10-CM | POA: Diagnosis not present

## 2014-12-02 DIAGNOSIS — R739 Hyperglycemia, unspecified: Secondary | ICD-10-CM | POA: Diagnosis not present

## 2014-12-02 LAB — HEMOGLOBIN A1C: Hgb A1c MFr Bld: 5.7 % (ref 4.6–6.5)

## 2014-12-02 LAB — COMPREHENSIVE METABOLIC PANEL
ALT: 62 U/L — AB (ref 0–53)
AST: 34 U/L (ref 0–37)
Albumin: 4.6 g/dL (ref 3.5–5.2)
Alkaline Phosphatase: 84 U/L (ref 39–117)
BUN: 14 mg/dL (ref 6–23)
CALCIUM: 9.7 mg/dL (ref 8.4–10.5)
CHLORIDE: 105 meq/L (ref 96–112)
CO2: 28 meq/L (ref 19–32)
Creatinine, Ser: 1.11 mg/dL (ref 0.40–1.50)
GFR: 73.69 mL/min (ref >60.00)
Glucose, Bld: 86 mg/dL (ref 70–99)
POTASSIUM: 4.2 meq/L (ref 3.5–5.1)
SODIUM: 139 meq/L (ref 135–145)
TOTAL PROTEIN: 6.9 g/dL (ref 6.0–8.3)
Total Bilirubin: 0.9 mg/dL (ref 0.2–1.2)

## 2014-12-02 LAB — CBC WITH DIFFERENTIAL/PLATELET
Basophils Absolute: 0 10*3/uL (ref 0.0–0.1)
Basophils Relative: 0.3 % (ref 0.0–3.0)
EOS PCT: 0.8 % (ref 0.0–5.0)
Eosinophils Absolute: 0.1 10*3/uL (ref 0.0–0.7)
HEMATOCRIT: 45.5 % (ref 39.0–52.0)
Hemoglobin: 15.4 g/dL (ref 13.0–17.0)
Lymphocytes Relative: 28.4 % (ref 12.0–46.0)
Lymphs Abs: 2.2 10*3/uL (ref 0.7–4.0)
MCHC: 33.7 g/dL (ref 30.0–36.0)
MCV: 86.5 fl (ref 78.0–100.0)
MONO ABS: 0.6 10*3/uL (ref 0.1–1.0)
Monocytes Relative: 7.6 % (ref 3.0–12.0)
Neutro Abs: 4.9 10*3/uL (ref 1.4–7.7)
Neutrophils Relative %: 62.9 % (ref 43.0–77.0)
Platelets: 217 10*3/uL (ref 150.0–400.0)
RBC: 5.26 Mil/uL (ref 4.22–5.81)
RDW: 14 % (ref 11.5–15.5)
WBC: 7.7 10*3/uL (ref 4.0–10.5)

## 2014-12-02 LAB — LIPID PANEL
Cholesterol: 168 mg/dL (ref 0–200)
HDL: 38.8 mg/dL — ABNORMAL LOW (ref >39.00)
LDL Cholesterol: 96 mg/dL (ref 0–99)
NonHDL: 129.2
Total CHOL/HDL Ratio: 4
Triglycerides: 164 mg/dL — ABNORMAL HIGH (ref 0.0–149.0)
VLDL: 32.8 mg/dL (ref 0.0–40.0)

## 2014-12-02 LAB — TSH: TSH: 1.97 u[IU]/mL (ref 0.35–4.50)

## 2014-12-02 NOTE — Telephone Encounter (Signed)
-----   Message from Marchia Bond sent at 12/02/2014 10:31 AM EDT ----- Regarding: Pt had cpx labs today, need orders. Pt had cpx labs today, need orders.  Thanks Aniceto Boss

## 2014-12-09 ENCOUNTER — Ambulatory Visit (INDEPENDENT_AMBULATORY_CARE_PROVIDER_SITE_OTHER): Payer: 59 | Admitting: Family Medicine

## 2014-12-09 ENCOUNTER — Encounter: Payer: Self-pay | Admitting: Family Medicine

## 2014-12-09 VITALS — BP 126/78 | HR 60 | Temp 98.1°F | Ht 69.25 in | Wt 224.2 lb

## 2014-12-09 DIAGNOSIS — E669 Obesity, unspecified: Secondary | ICD-10-CM

## 2014-12-09 DIAGNOSIS — R740 Nonspecific elevation of levels of transaminase and lactic acid dehydrogenase [LDH]: Secondary | ICD-10-CM

## 2014-12-09 DIAGNOSIS — R739 Hyperglycemia, unspecified: Secondary | ICD-10-CM

## 2014-12-09 DIAGNOSIS — Z0000 Encounter for general adult medical examination without abnormal findings: Secondary | ICD-10-CM | POA: Diagnosis not present

## 2014-12-09 DIAGNOSIS — Z125 Encounter for screening for malignant neoplasm of prostate: Secondary | ICD-10-CM | POA: Insufficient documentation

## 2014-12-09 DIAGNOSIS — I10 Essential (primary) hypertension: Secondary | ICD-10-CM

## 2014-12-09 DIAGNOSIS — R7402 Elevation of levels of lactic acid dehydrogenase (LDH): Secondary | ICD-10-CM

## 2014-12-09 DIAGNOSIS — E785 Hyperlipidemia, unspecified: Secondary | ICD-10-CM

## 2014-12-09 DIAGNOSIS — R7401 Elevation of levels of liver transaminase levels: Secondary | ICD-10-CM

## 2014-12-09 MED ORDER — LOSARTAN POTASSIUM 100 MG PO TABS: ORAL_TABLET | ORAL | Status: DC

## 2014-12-09 MED ORDER — TADALAFIL 5 MG PO TABS: 5.0000 mg | ORAL_TABLET | Freq: Every day | ORAL | Status: DC | PRN

## 2014-12-09 NOTE — Assessment & Plan Note (Signed)
Disc goals for lipids and reasons to control them Rev labs with pt Rev low sat fat diet in detail HDL up a bit with fish oil- keep working to get to goal

## 2014-12-09 NOTE — Assessment & Plan Note (Signed)
Improving with better diet Rev lab with pt  Avoid acetaminophen and etoh  Continue to follow

## 2014-12-09 NOTE — Progress Notes (Signed)
Subjective:    Patient ID: Terry Dixon, male    DOB: [birth year], 54 y.o.   MRN: [id number]  HPI Here for health maintenance exam and to review chronic medical problems    Doing well overall   Wt is up 2 lb with bmi of 32 Obese  Habits- has a gym membership/ long hours at work also (but is walking at work) Diet- "eating right" - vegetables/ has a garden  Hep C/HIV screening - not high risk / declines it  Also had a hep C screening as part of liver w/u   Flu shot 11/15  Td 10/10   Colonoscopy 1/14 -normal  No fam hx of colon cancer   Prostate hx  Father has enlarged prostate at age 72 No cancer  MGF prostate cancer at age 62   Lab Results  Component Value Date   PSA 0.64 11/12/2009   no symptoms  If he is woken up at night -will urinate / but a full bladder will not wake him up  No issues with stream  Used to see a urologist  Takes cialis -occasionally- has not filled px in 2 years   Is interested in PSA next time    Hx of elevated transaminases  Lab Results  Component Value Date   ALT 62* 12/02/2014   AST 34 12/02/2014   ALKPHOS 84 12/02/2014   BILITOT 0.9 12/02/2014   (alt is down)- improved  Fatty liver  Hx of hyperlipidemia Lab Results  Component Value Date   CHOL 168 12/02/2014   CHOL 147 06/04/2014   CHOL 173 06/06/2013   Lab Results  Component Value Date   HDL 38.80* 12/02/2014   HDL 33.50* 06/04/2014   HDL 36.70* 06/06/2013   Lab Results  Component Value Date   LDLCALC 96 12/02/2014   LDLCALC 85 06/04/2014   LDLCALC 100* 06/06/2013   Lab Results  Component Value Date   TRIG 164.0* 12/02/2014   TRIG 141.0 06/04/2014   TRIG 183.0* 06/06/2013   Lab Results  Component Value Date   CHOLHDL 4 12/02/2014   CHOLHDL 4 06/04/2014   CHOLHDL 5 06/06/2013   Lab Results  Component Value Date   LDLDIRECT 115.6 11/17/2010   LDLDIRECT 121.8 07/17/2007   LDLDIRECT 100.5 01/03/2007   he changed his brand of fish oil - to mega red    Overall stable    bp is stable today  No cp or palpitations or headaches or edema  No side effects to medicines  BP Readings from Last 3 Encounters:  12/09/14 126/78  10/21/14 140/78  09/03/14 140/70     Hyperglycemia Lab Results  Component Value Date   HGBA1C 5.7 12/02/2014    Was 6.0  From good eating   Goes to [facility] for regular exams -has had some moles removed - this last check was ok  Wears sun protection    Patient Active Problem List   Diagnosis Date Noted  . Viral URI with cough 09/03/2014  . Hyperglycemia 12/05/2013  . Claudication 08/06/2013  . Numbness and tingling of both legs 08/06/2013  . Steatohepatitis, non-alcoholic 83/29/1916  . Colon cancer screening 05/31/2012  . Low serum testosterone level 11/28/2011  . Routine general medical examination at a health care facility 11/23/2011  . Family history of non-anemic vitamin B12 deficiency 05/22/2011  . TRANSAMINASES, SERUM, ELEVATED 05/23/2010  . ERECTILE DYSFUNCTION 05/26/2008  . Obesity 01/03/2007  . Hyperlipidemia 01/01/2007  . Essential hypertension 01/01/2007  . DIVERTICULOSIS, COLON  01/01/2007   Past Medical History  Diagnosis Date  . Diverticulitis     colon   . Hyperlipidemia   . Obesity   . Hypertension   . ED (erectile dysfunction)   . Low testosterone    Past Surgical History  Procedure Laterality Date  . Vasectomy  1999  . Hernia repair  2000    R & L   History  Substance Use Topics  . Smoking status: Never Smoker   . Smokeless tobacco: Never Used     Comment: age 74 for 1 year   . Alcohol Use: No     Comment: rare   Family History  Problem Relation Age of Onset  . Diabetes Mother   . COPD Mother   . Benign prostatic hyperplasia Father   . Hypertension Father    Allergies  Allergen Reactions  . Dairy Aid [Lactase]   . Viagra [Sildenafil Citrate]     Leg pain   Current Outpatient Prescriptions on File Prior to Visit  Medication Sig Dispense Refill  .  albuterol (PROVENTIL HFA;VENTOLIN HFA) 108 (90 BASE) MCG/ACT inhaler Inhale 2 puffs into the lungs every 6 (six) hours as needed for wheezing or shortness of breath. 1 Inhaler 0  . benzonatate (TESSALON) 200 MG capsule Take 200 mg by mouth 3 (three) times daily as needed for cough.    . cetirizine (ZYRTEC) 10 MG tablet Take 10 mg by mouth daily as needed for allergies.    . DiphenhydrAMINE HCl, Sleep, (ZZZQUIL) 25 MG CAPS Take by mouth at bedtime as needed.    . DULERA 100-5 MCG/ACT AERO   10  . EPIPEN 2-PAK 0.3 MG/0.3ML SOAJ injection See admin instructions.  11  . fluticasone (FLONASE) 50 MCG/ACT nasal spray   19  . guaiFENesin (MUCINEX) 600 MG 12 hr tablet Take 1 tablet (600 mg total) by mouth 2 (two) times daily. 60 tablet 0  . guaiFENesin-codeine (ROBITUSSIN AC) 100-10 MG/5ML syrup Take 5 mLs by mouth 3 (three) times daily as needed for cough.    . losartan (COZAAR) 100 MG tablet TAKE 1 TABLET (100 MG TOTAL) BY MOUTH DAILY. 90 tablet 2  . MELATONIN MAXIMUM STRENGTH PO Take 10 mg by mouth at bedtime as needed.    . Multiple Vitamin (DAILY MULTIVITAMIN PO) Take by mouth daily.      . Omega-3 Fatty Acids (FISH OIL) 1200 MG CAPS Take by mouth daily.      . pseudoephedrine (SUDAFED) 120 MG 12 hr tablet Take 120 mg by mouth 2 (two) times daily.    . tadalafil (CIALIS) 5 MG tablet Take 1 tablet (5 mg total) by mouth daily as needed for erectile dysfunction. 10 tablet 5   No current facility-administered medications on file prior to visit.     Review of Systems    Review of Systems  Constitutional: Negative for fever, appetite change, and unexpected weight change. pos for some fatigue from long work hours  Eyes: Negative for pain and visual disturbance.  Respiratory: Negative for cough and shortness of breath.   Cardiovascular: Negative for cp or palpitations    Gastrointestinal: Negative for nausea, diarrhea and constipation.  Genitourinary: Negative for urgency and frequency.  Skin:  Negative for pallor or rash   Neurological: Negative for weakness, light-headedness, numbness and headaches.  Hematological: Negative for adenopathy. Does not bruise/bleed easily.  Psychiatric/Behavioral: Negative for dysphoric mood. The patient is not nervous/anxious.      Objective:   Physical Exam  Constitutional: He  appears well-developed and well-nourished. No distress.  obese and well appearing  (central obesity noted)  HENT:  Head: Normocephalic and atraumatic.  Right Ear: External ear normal.  Left Ear: External ear normal.  Nose: Nose normal.  Mouth/Throat: Oropharynx is clear and moist.  Eyes: Conjunctivae and EOM are normal. Pupils are equal, round, and reactive to light. Right eye exhibits no discharge. Left eye exhibits no discharge. No scleral icterus.  Neck: Normal range of motion. Neck supple. No JVD present. Carotid bruit is not present. No thyromegaly present.  Cardiovascular: Normal rate, regular rhythm, normal heart sounds and intact distal pulses.  Exam reveals no gallop.   Pulmonary/Chest: Effort normal and breath sounds normal. No respiratory distress. He has no wheezes. He exhibits no tenderness.  Abdominal: Soft. Bowel sounds are normal. He exhibits no distension, no abdominal bruit and no mass. There is no tenderness.  Umbilical hernia unchanged and reducible   Musculoskeletal: He exhibits no edema or tenderness.  Lymphadenopathy:    He has no cervical adenopathy.  Neurological: He is alert. He has normal reflexes. No cranial nerve deficit. He exhibits normal muscle tone. Coordination normal.  Skin: Skin is warm and dry. No rash noted. No erythema. No pallor.  Psychiatric: He has a normal mood and affect.          Assessment & Plan:   Problem List Items Addressed This Visit    Essential hypertension - Primary    bp in fair control at this time  BP Readings from Last 1 Encounters:  12/09/14 126/78   No changes needed Disc lifstyle change with low  sodium diet and exercise  Labs reviewed Losartan refilled Enc exercise and wt loss       Relevant Medications   tadalafil (CIALIS) 5 MG tablet   losartan (COZAAR) 100 MG tablet   Hyperglycemia    Improved A1C Disc low glycemic diet Enc wt loss to prevent DM      Hyperlipidemia    Disc goals for lipids and reasons to control them Rev labs with pt Rev low sat fat diet in detail HDL up a bit with fish oil- keep working to get to goal        Relevant Medications   tadalafil (CIALIS) 5 MG tablet   losartan (COZAAR) 100 MG tablet   Obesity    Discussed how this problem influences overall health and the risks it imposes  Reviewed plan for weight loss with lower calorie diet (via better food choices and also portion control or program like weight watchers) and exercise building up to or more than 30 minutes 5 days per week including some aerobic activity         Prostate cancer screening    Pt no longer sees urology  Will do psa for next visit No symptoms  No sig fam hx       Routine general medical examination at a health care facility    Reviewed health habits including diet and exercise and skin cancer prevention Reviewed appropriate screening tests for age  Also reviewed health mt list, fam hx and immunization status , as well as social and family history   See HPI Labs rev Disc need for wt loss       TRANSAMINASES, SERUM, ELEVATED    Improving with better diet Rev lab with pt  Avoid acetaminophen and etoh  Continue to follow

## 2014-12-09 NOTE — Patient Instructions (Signed)
Your liver tests are improved  Cholesterol is stable - keep working on getting HDL up (exercise and fish oil)  Glucose control is better - stay away from sweets and sugar  We will do a psa test for prostate cancer screening next time

## 2014-12-09 NOTE — Assessment & Plan Note (Signed)
Improved A1C Disc low glycemic diet Enc wt loss to prevent DM

## 2014-12-09 NOTE — Assessment & Plan Note (Signed)
Discussed how this problem influences overall health and the risks it imposes  Reviewed plan for weight loss with lower calorie diet (via better food choices and also portion control or program like weight watchers) and exercise building up to or more than 30 minutes 5 days per week including some aerobic activity    

## 2014-12-09 NOTE — Assessment & Plan Note (Signed)
Reviewed health habits including diet and exercise and skin cancer prevention Reviewed appropriate screening tests for age  Also reviewed health mt list, fam hx and immunization status , as well as social and family history   See HPI Labs rev Disc need for wt loss

## 2014-12-09 NOTE — Assessment & Plan Note (Signed)
Pt no longer sees urology  Will do psa for next visit No symptoms  No sig fam hx

## 2014-12-09 NOTE — Assessment & Plan Note (Signed)
bp in fair control at this time  BP Readings from Last 1 Encounters:  12/09/14 126/78   No changes needed Disc lifstyle change with low sodium diet and exercise  Labs reviewed Losartan refilled Enc exercise and wt loss

## 2014-12-09 NOTE — Progress Notes (Signed)
Pre visit review using our clinic review tool, if applicable. No additional management support is needed unless otherwise documented below in the visit note. 

## 2015-05-05 IMAGING — US US ABDOMEN COMPLETE
1 series · 14 of 25 positions shown · non-contrast
Comparison: 11/25/2010.

CLINICAL DATA: Fatty liver disease. Evaluate for hepatocellular
carcinoma.

EXAM:
ULTRASOUND ABDOMEN COMPLETE

[Series 1: us abdomen complete · 0.39mm/px · 14 of 68 slices shown]
[im 1/68]
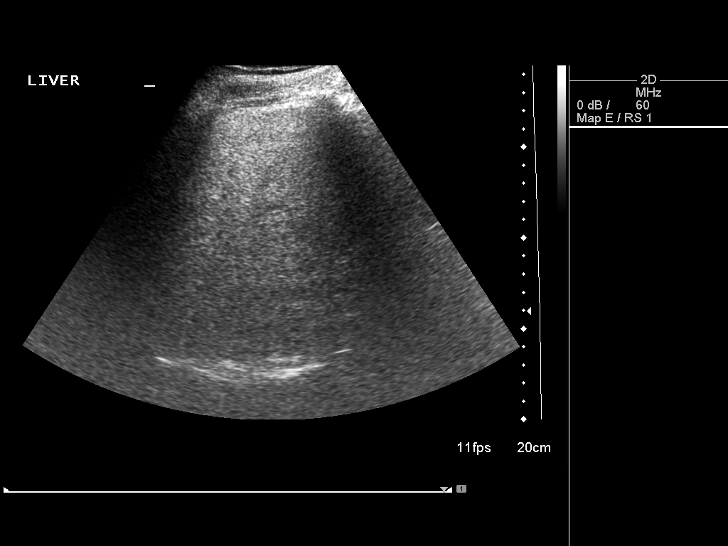
[im 6/68]
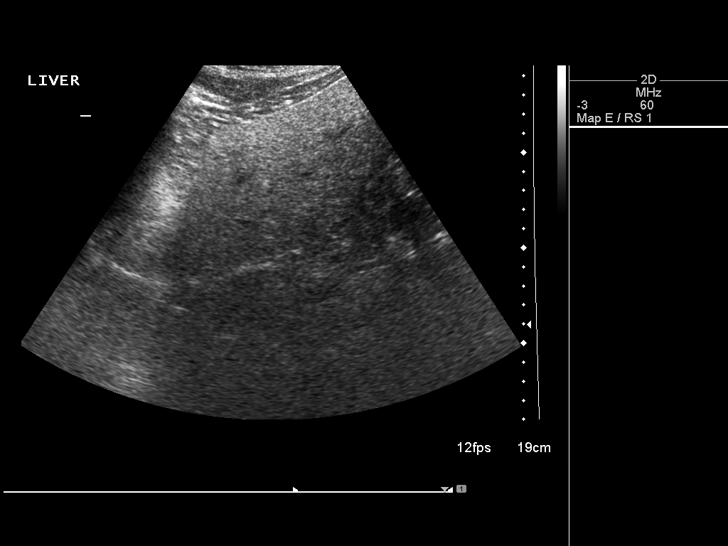
[im 12/68]
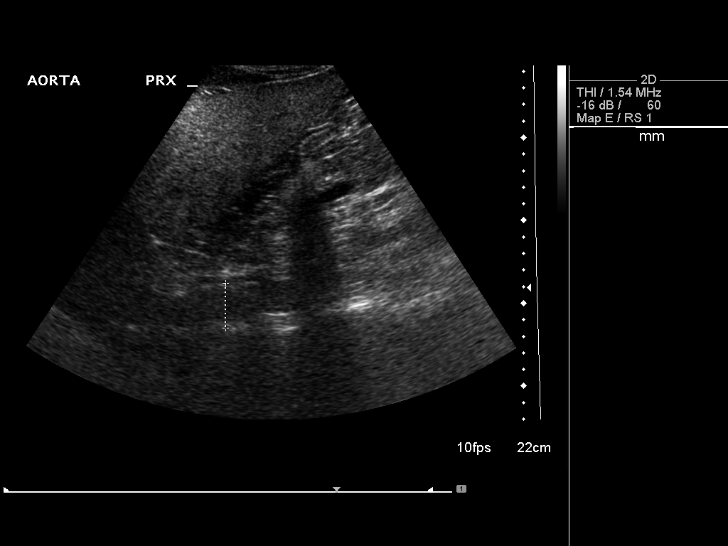
[im 17/68]
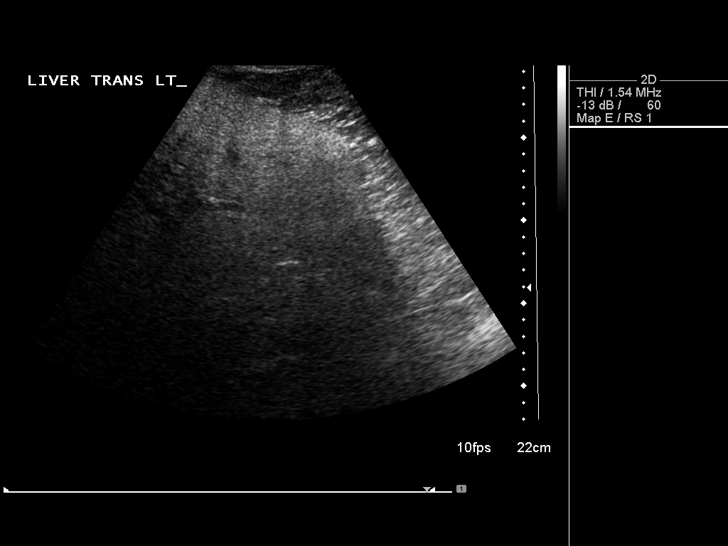
[im 23/68]
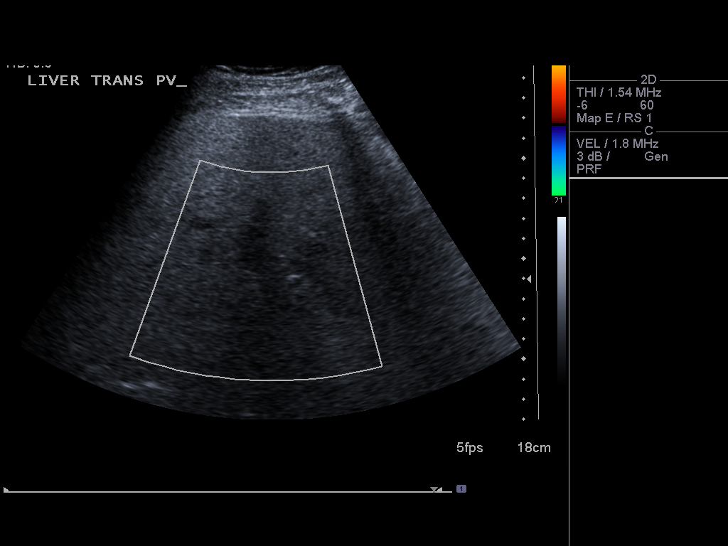
[im 26/68]
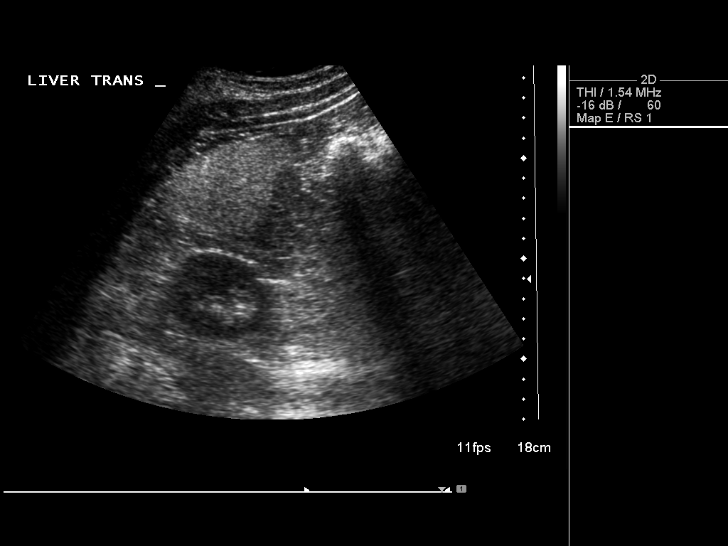
[im 31/68]
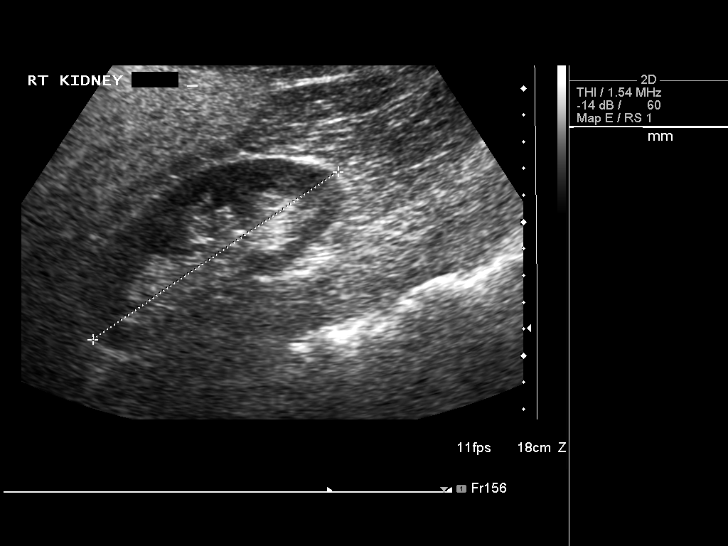
[im 37/68]
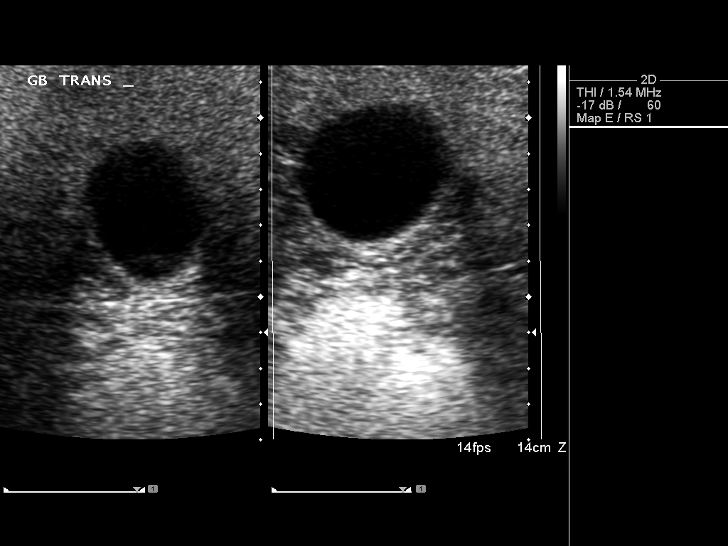
[im 42/68]
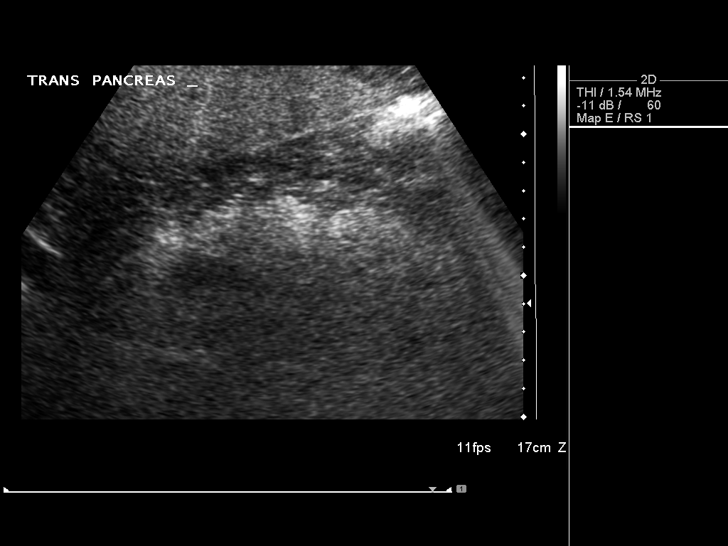
[im 45/68]
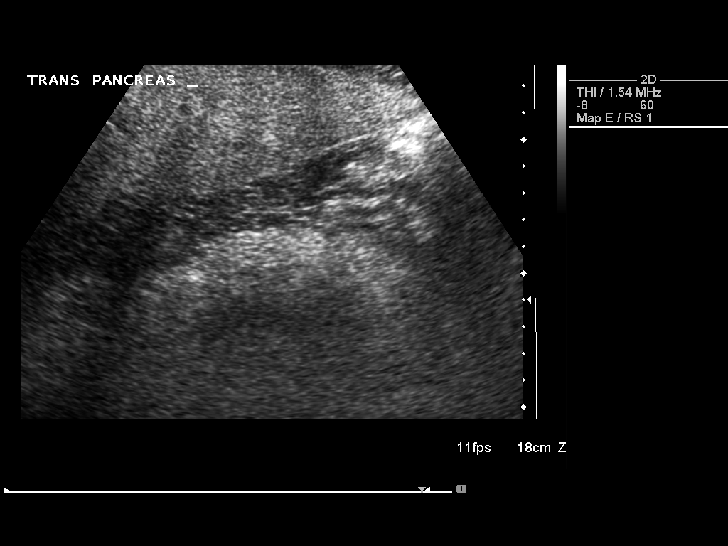
[im 51/68]
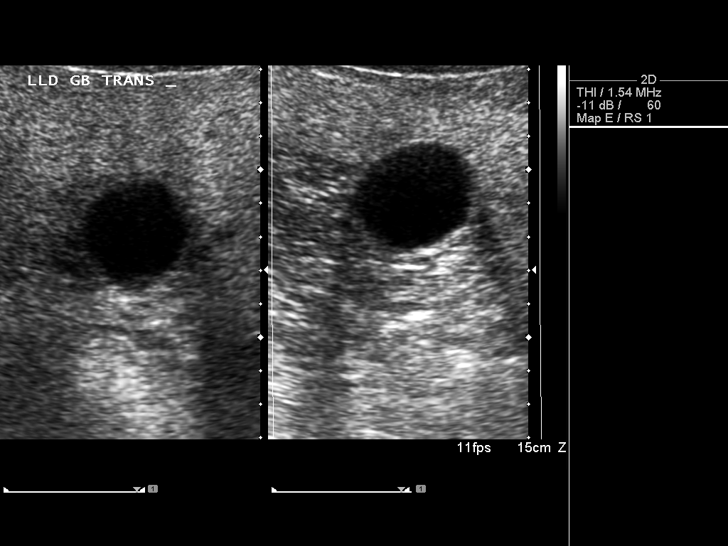
[im 56/68]
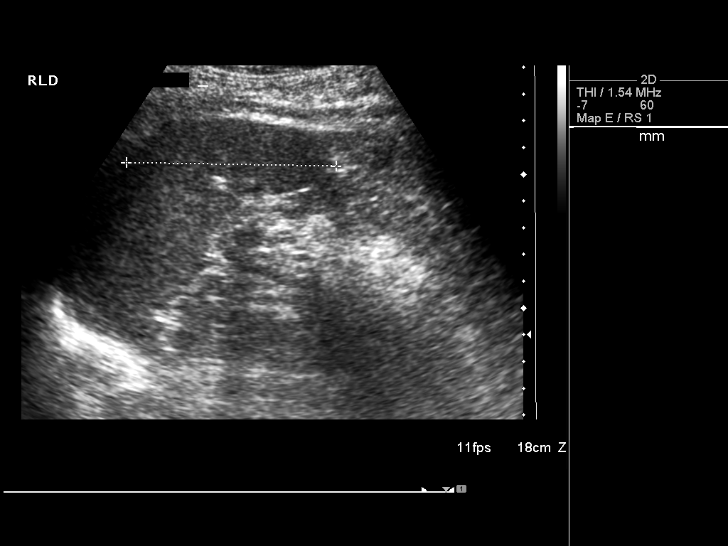
[im 62/68]
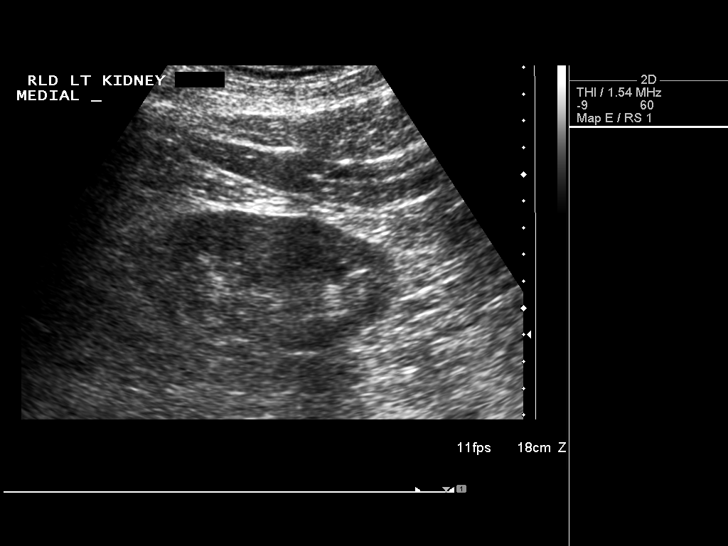
[im 68/68]
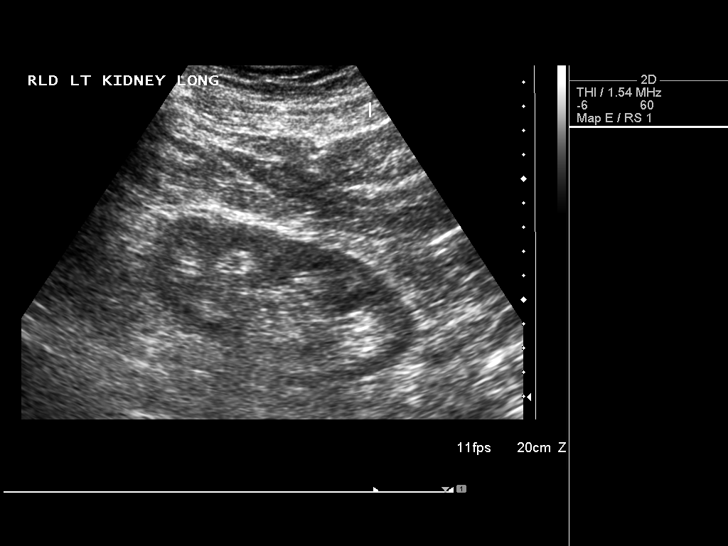

[14 of 25 positions shown; findings below may reference images not displayed]

FINDINGS: Gallbladder:

No gallstones or wall thickening visualized. No sonographic Murphy
sign noted.

Common bile duct:

Diameter: 4 mm, normal.

Liver:

Diffusely echogenic.  No biliary ductal dilation.  No mass lesion.

IVC:

No abnormality visualized.

Pancreas:

Visualized portion unremarkable.

Spleen:

Size and appearance within normal limits.

Right Kidney:

Length: 11.5 cm. Echogenicity within normal limits. No mass or
hydronephrosis visualized.

Left Kidney:

Length: 12.4 cm. Echogenicity within normal limits. No mass or
hydronephrosis visualized.

Abdominal aorta:

No aneurysm visualized.

Other findings:

None.
IMPRESSION: Diffuse hepatosteatosis. No liver mass. No interval change from
prior.

## 2015-05-27 ENCOUNTER — Telehealth: Payer: Self-pay | Admitting: Family Medicine

## 2015-05-27 DIAGNOSIS — I10 Essential (primary) hypertension: Secondary | ICD-10-CM

## 2015-05-27 DIAGNOSIS — Z125 Encounter for screening for malignant neoplasm of prostate: Secondary | ICD-10-CM

## 2015-05-27 DIAGNOSIS — E785 Hyperlipidemia, unspecified: Secondary | ICD-10-CM

## 2015-05-27 DIAGNOSIS — R739 Hyperglycemia, unspecified: Secondary | ICD-10-CM

## 2015-05-27 NOTE — Telephone Encounter (Signed)
-----   Message from Marchia Bond sent at 05/25/2015  2:12 PM EST ----- Regarding: 6 mo f/u labs Mon 11/14 need orders, thanks :-) Please order future 6 mo f/u labs for pt's upcoming lab appt. Thanks Aniceto Boss

## 2015-05-28 ENCOUNTER — Other Ambulatory Visit (INDEPENDENT_AMBULATORY_CARE_PROVIDER_SITE_OTHER): Payer: 59

## 2015-05-28 DIAGNOSIS — E785 Hyperlipidemia, unspecified: Secondary | ICD-10-CM

## 2015-05-28 DIAGNOSIS — I10 Essential (primary) hypertension: Secondary | ICD-10-CM | POA: Diagnosis not present

## 2015-05-28 DIAGNOSIS — R739 Hyperglycemia, unspecified: Secondary | ICD-10-CM

## 2015-05-28 DIAGNOSIS — Z125 Encounter for screening for malignant neoplasm of prostate: Secondary | ICD-10-CM

## 2015-05-28 LAB — COMPREHENSIVE METABOLIC PANEL
ALT: 64 U/L — AB (ref 0–53)
AST: 38 U/L — ABNORMAL HIGH (ref 0–37)
Albumin: 4.3 g/dL (ref 3.5–5.2)
Alkaline Phosphatase: 82 U/L (ref 39–117)
BUN: 12 mg/dL (ref 6–23)
CALCIUM: 9.4 mg/dL (ref 8.4–10.5)
CO2: 27 meq/L (ref 19–32)
CREATININE: 0.99 mg/dL (ref 0.40–1.50)
Chloride: 108 mEq/L (ref 96–112)
GFR: 83.94 mL/min (ref >60.00)
Glucose, Bld: 102 mg/dL — ABNORMAL HIGH (ref 70–99)
POTASSIUM: 4.2 meq/L (ref 3.5–5.1)
Sodium: 143 mEq/L (ref 135–145)
Total Bilirubin: 0.8 mg/dL (ref 0.2–1.2)
Total Protein: 6.7 g/dL (ref 6.0–8.3)

## 2015-05-28 LAB — LIPID PANEL
Cholesterol: 154 mg/dL (ref 0–200)
HDL: 36.8 mg/dL — ABNORMAL LOW (ref >39.00)
LDL Cholesterol: 87 mg/dL (ref 0–99)
NONHDL: 117.02
Total CHOL/HDL Ratio: 4
Triglycerides: 151 mg/dL — ABNORMAL HIGH (ref 0.0–149.0)
VLDL: 30.2 mg/dL (ref 0.0–40.0)

## 2015-05-28 LAB — HEMOGLOBIN A1C: HEMOGLOBIN A1C: 6.5 % (ref 4.6–6.5)

## 2015-05-28 LAB — PSA: PSA: 0.68 ng/mL (ref 0.10–4.00)

## 2015-05-31 ENCOUNTER — Other Ambulatory Visit: Payer: 59

## 2015-06-07 ENCOUNTER — Encounter: Payer: Self-pay | Admitting: Family Medicine

## 2015-06-07 ENCOUNTER — Ambulatory Visit (INDEPENDENT_AMBULATORY_CARE_PROVIDER_SITE_OTHER): Payer: 59 | Admitting: Family Medicine

## 2015-06-07 VITALS — BP 130/80 | HR 62 | Temp 98.3°F | Ht 69.25 in | Wt 236.5 lb

## 2015-06-07 DIAGNOSIS — I10 Essential (primary) hypertension: Secondary | ICD-10-CM | POA: Diagnosis not present

## 2015-06-07 DIAGNOSIS — Z23 Encounter for immunization: Secondary | ICD-10-CM

## 2015-06-07 DIAGNOSIS — E1169 Type 2 diabetes mellitus with other specified complication: Secondary | ICD-10-CM

## 2015-06-07 DIAGNOSIS — E119 Type 2 diabetes mellitus without complications: Secondary | ICD-10-CM

## 2015-06-07 DIAGNOSIS — E785 Hyperlipidemia, unspecified: Secondary | ICD-10-CM

## 2015-06-07 DIAGNOSIS — E669 Obesity, unspecified: Secondary | ICD-10-CM

## 2015-06-07 NOTE — Patient Instructions (Signed)
Please stop up front for referral to diabetes education  Limit starches - pasta/ bread/potato/rice  Also avoid sweets and sugar when you can  Eat more lean protein and green vegetables Try to aim for exercise 5 days per week- working up to it  Work on weight loss   Follow up with me in 3 months with labs prior

## 2015-06-07 NOTE — Assessment & Plan Note (Signed)
Discussed how this problem influences overall health and the risks it imposes  Reviewed plan for weight loss with lower calorie diet (via better food choices and also portion control or program like weight watchers) and exercise building up to or more than 30 minutes 5 days per week including some aerobic activity   Now with new DM Goals set and plan made F/u 38mo

## 2015-06-07 NOTE — Assessment & Plan Note (Signed)
Fairly stable Disc goals for lipids and reasons to control them (esp in light of DM) Rev labs with pt Rev low sat fat diet in detail

## 2015-06-07 NOTE — Assessment & Plan Note (Signed)
bp in fair control at this time  BP Readings from Last 1 Encounters:  06/07/15 130/80   No changes needed Disc lifstyle change with low sodium diet and exercise  Labs reviewed  Wt loss enc

## 2015-06-07 NOTE — Progress Notes (Signed)
Pre visit review using our clinic review tool, if applicable. No additional management support is needed unless otherwise documented below in the visit note. 

## 2015-06-07 NOTE — Progress Notes (Signed)
Subjective:    Patient ID: Terry Dixon, male    DOB: [birth year], 54 y.o.   MRN: [id number]  HPI Here for f/u of chronic health problems   Also flu shot   Wt is up 12 lb with bmi of 34 Obese range Pt states some of this is steeled toed shoes - but not all of it  Does not weigh at home - clothes are tight  Not exercising -he has a membership to a gym but does not go (he is motivated now)  bp is stable today  No cp or palpitations or headaches or edema  No side effects to medicines  BP Readings from Last 3 Encounters:  06/07/15 130/80  12/09/14 126/78  10/21/14 140/78       Chemistry      Component Value Date/Time   NA 143 05/28/2015 0815   K 4.2 05/28/2015 0815   CL 108 05/28/2015 0815   CO2 27 05/28/2015 0815   BUN 12 05/28/2015 0815   CREATININE 0.99 05/28/2015 0815      Component Value Date/Time   CALCIUM 9.4 05/28/2015 0815   ALKPHOS 82 05/28/2015 0815   AST 38* 05/28/2015 0815   ALT 64* 05/28/2015 0815   BILITOT 0.8 05/28/2015 0815       Hyperlipidemia Diet controlled Lab Results  Component Value Date   CHOL 154 05/28/2015   HDL 36.80* 05/28/2015   LDLCALC 87 05/28/2015   LDLDIRECT 115.6 11/17/2010   TRIG 151.0* 05/28/2015   CHOLHDL 4 05/28/2015    Hyperglycemia Lab Results  Component Value Date   HGBA1C 6.5 05/28/2015   This is up from 5.7 He has a lot of DM in the family   Eats too much sweets and starches   No numbness in fingers and toes   AST/ALT up slightly Lab Results  Component Value Date   ALT 64* 05/28/2015   AST 38* 05/28/2015   ALKPHOS 82 05/28/2015   BILITOT 0.8 05/28/2015     Patient Active Problem List   Diagnosis Date Noted  . Prostate cancer screening 12/09/2014  . Viral URI with cough 09/03/2014  . Diabetes mellitus type 2 in obese ([facility]) 12/05/2013  . Claudication ([place]) 08/06/2013  . Numbness and tingling of both legs 08/06/2013  . Steatohepatitis, non-alcoholic 49/44/9675  . Colon cancer screening  05/31/2012  . Low serum testosterone level 11/28/2011  . Routine general medical examination at a health care facility 11/23/2011  . Family history of non-anemic vitamin B12 deficiency 05/22/2011  . TRANSAMINASES, SERUM, ELEVATED 05/23/2010  . ERECTILE DYSFUNCTION 05/26/2008  . Obesity 01/03/2007  . Hyperlipidemia 01/01/2007  . Essential hypertension 01/01/2007  . DIVERTICULOSIS, COLON 01/01/2007   Past Medical History  Diagnosis Date  . Diverticulitis     colon   . Hyperlipidemia   . Obesity   . Hypertension   . ED (erectile dysfunction)   . Low testosterone    Past Surgical History  Procedure Laterality Date  . Vasectomy  1999  . Hernia repair  2000    R & L   Social History  Substance Use Topics  . Smoking status: Never Smoker   . Smokeless tobacco: Never Used     Comment: age 72 for 1 year   . Alcohol Use: No     Comment: rare   Family History  Problem Relation Age of Onset  . Diabetes Mother   . COPD Mother   . Benign prostatic hyperplasia Father   . Hypertension Father  Allergies  Allergen Reactions  . Dairy Aid [Lactase]   . Viagra [Sildenafil Citrate]     Leg pain   Current Outpatient Prescriptions on File Prior to Visit  Medication Sig Dispense Refill  . albuterol (PROVENTIL HFA;VENTOLIN HFA) 108 (90 BASE) MCG/ACT inhaler Inhale 2 puffs into the lungs every 6 (six) hours as needed for wheezing or shortness of breath. 1 Inhaler 0  . benzonatate (TESSALON) 200 MG capsule Take 200 mg by mouth 3 (three) times daily as needed for cough.    . cetirizine (ZYRTEC) 10 MG tablet Take 10 mg by mouth daily as needed for allergies.    . DiphenhydrAMINE HCl, Sleep, (ZZZQUIL) 25 MG CAPS Take by mouth at bedtime as needed.    . DULERA 100-5 MCG/ACT AERO   10  . EPIPEN 2-PAK 0.3 MG/0.3ML SOAJ injection See admin instructions.  11  . fluticasone (FLONASE) 50 MCG/ACT nasal spray   19  . guaiFENesin (MUCINEX) 600 MG 12 hr tablet Take 1 tablet (600 mg total) by mouth 2  (two) times daily. 60 tablet 0  . guaiFENesin-codeine (ROBITUSSIN AC) 100-10 MG/5ML syrup Take 5 mLs by mouth 3 (three) times daily as needed for cough.    . losartan (COZAAR) 100 MG tablet TAKE 1 TABLET (100 MG TOTAL) BY MOUTH DAILY. 90 tablet 3  . MELATONIN MAXIMUM STRENGTH PO Take 10 mg by mouth at bedtime as needed.    . Multiple Vitamin (DAILY MULTIVITAMIN PO) Take by mouth daily.      . Omega-3 Fatty Acids (FISH OIL) 1200 MG CAPS Take by mouth daily.      . pseudoephedrine (SUDAFED) 120 MG 12 hr tablet Take 120 mg by mouth 2 (two) times daily.    . tadalafil (CIALIS) 5 MG tablet Take 1 tablet (5 mg total) by mouth daily as needed for erectile dysfunction. 10 tablet 5   No current facility-administered medications on file prior to visit.    Review of Systems Review of Systems  Constitutional: Negative for fever, appetite change, fatigue and unexpected weight change.  Eyes: Negative for pain and visual disturbance.  Respiratory: Negative for cough and shortness of breath.   Cardiovascular: Negative for cp or palpitations    Gastrointestinal: Negative for nausea, diarrhea and constipation.  Genitourinary: Negative for urgency and frequency.  Skin: Negative for pallor or rash   Neurological: Negative for weakness, light-headedness, numbness and headaches.  Hematological: Negative for adenopathy. Does not bruise/bleed easily.  Psychiatric/Behavioral: Negative for dysphoric mood. The patient is not nervous/anxious.         Objective:   Physical Exam  Constitutional: He appears well-developed and well-nourished. No distress.  obese and well appearing   HENT:  Head: Normocephalic and atraumatic.  Mouth/Throat: Oropharynx is clear and moist.  Eyes: Conjunctivae and EOM are normal. Pupils are equal, round, and reactive to light.  Neck: Normal range of motion. Neck supple. No JVD present. Carotid bruit is not present. No thyromegaly present.  Cardiovascular: Normal rate, regular rhythm,  normal heart sounds and intact distal pulses.  Exam reveals no gallop.   Pulmonary/Chest: Effort normal and breath sounds normal. No respiratory distress. He has no wheezes. He has no rales.  No crackles  Abdominal: Soft. Bowel sounds are normal. He exhibits no distension, no abdominal bruit and no mass. There is no hepatosplenomegaly. There is no tenderness.  Musculoskeletal: He exhibits no edema.  Lymphadenopathy:    He has no cervical adenopathy.  Neurological: He is alert. He has normal reflexes.  Skin: Skin is warm and dry. No rash noted.  Psychiatric: He has a normal mood and affect.  Nursing note and vitals reviewed.         Assessment & Plan:   Problem List Items Addressed This Visit      Cardiovascular and Mediastinum   Essential hypertension    bp in fair control at this time  BP Readings from Last 1 Encounters:  06/07/15 130/80   No changes needed Disc lifstyle change with low sodium diet and exercise  Labs reviewed  Wt loss enc         Endocrine   Diabetes mellitus type 2 in obese [facility]) - Primary    New dx  Lab Results  Component Value Date   HGBA1C 6.5 05/28/2015   Has gained wt and has a fam hx Ref made to DM teaching  Stressed imp of wt loss and info given -plan made  F/u 3 mo and re check A1C prior  Med if needed       Relevant Orders   Ambulatory referral to diabetic education     Other   Hyperlipidemia    Fairly stable Disc goals for lipids and reasons to control them (esp in light of DM) Rev labs with pt Rev low sat fat diet in detail       Obesity    Discussed how this problem influences overall health and the risks it imposes  Reviewed plan for weight loss with lower calorie diet (via better food choices and also portion control or program like weight watchers) and exercise building up to or more than 30 minutes 5 days per week including some aerobic activity   Now with new DM Goals set and plan made F/u 75mo      Other Visit  Diagnoses    Need for influenza vaccination        Relevant Orders    Flu Vaccine QUAD 36+ mos PF IM (Fluarix & Fluzone Quad PF) (Completed)

## 2015-06-07 NOTE — Assessment & Plan Note (Signed)
New dx  Lab Results  Component Value Date   HGBA1C 6.5 05/28/2015   Has gained wt and has a fam hx Ref made to DM teaching  Stressed imp of wt loss and info given -plan made  F/u 3 mo and re check A1C prior  Med if needed

## 2015-07-06 ENCOUNTER — Encounter: Payer: 59 | Attending: Family Medicine | Admitting: Dietician

## 2015-07-06 ENCOUNTER — Encounter: Payer: Self-pay | Admitting: Dietician

## 2015-07-06 VITALS — BP 138/78 | Ht 69.0 in | Wt 228.6 lb

## 2015-07-06 DIAGNOSIS — E119 Type 2 diabetes mellitus without complications: Secondary | ICD-10-CM | POA: Insufficient documentation

## 2015-07-06 DIAGNOSIS — E669 Obesity, unspecified: Secondary | ICD-10-CM

## 2015-07-06 DIAGNOSIS — E1169 Type 2 diabetes mellitus with other specified complication: Secondary | ICD-10-CM

## 2015-07-06 NOTE — Progress Notes (Signed)
Diabetes Self-Management Education  Visit Type: First/Initial  Appt. Start Time: 0900 Appt. End Time: 0350  07/06/2015  Mr. Axel Filler, identified by name and date of birth, is a 54 y.o. male with a diagnosis of Diabetes: Type 2.   ASSESSMENT  Blood pressure 138/78, height 5' 9"  (1.753 m), weight 228 lb 9.6 oz (103.692 kg). Body mass index is 33.74 kg/(m^2).  Obesity Lacks knowledge of diabetes care and diet Is not testing BG's-does not have a BG meter Needs eye exam Works night shift resulting in erratic eating pattern      Diabetes Self-Management Education - 07/06/15 1110    Visit Information   Visit Type First/Initial   Initial Visit   Diabetes Type Type 2   Health Coping   How would you rate your overall health? Good   Psychosocial Assessment   Patient Belief/Attitude about Diabetes Motivated to manage diabetes   Self-care barriers --  works night shift   Other persons present Patient   Patient Concerns Monitoring;Healthy Lifestyle;Glycemic Control;Weight Control;Medication;Nutrition/Meal planning   Special Needs None   Preferred Learning Style Auditory   Learning Readiness Ready   What is the last grade level you completed in school? 14   Complications   Last HgB A1C per patient/outside source 6.5 %  05-2015   How often do you check your blood sugar? 0 times/day (not testing)   Have you had a dilated eye exam in the past 12 months? Yes   Have you had a dental exam in the past 12 months? Yes   Are you checking your feet? Yes   How many days per week are you checking your feet? 7   Dietary Intake   Breakfast --  works night shift 7a-7p-eats 2 eggs with or without a biscuit  at 7:30-8am (if works then sleeps during day)   Lunch --  eats lunch at 1p if not working; if working sleeps all day and eats at [employer] --  if not working eats at [facility]; if working eats at [employer] (evening) --  usually eats no snacks   Beverage(s) --  drinks water 5-7/day ,  unsweetened coffee 2/day   Exercise   Exercise Type ADL's  active at work   Patient Education   Previous Diabetes Education No   Disease state  --  definition of type 2 diabetes and treatment options   Nutrition management  Role of diet in the treatment of diabetes and the relationship between the three main macronutrients and blood glucose level;Food label reading, portion sizes and measuring food.;Carbohydrate counting   Physical activity and exercise  Role of exercise on diabetes management, blood pressure control and cardiac health.   Monitoring Taught/evaluated SMBG meter.;Purpose and frequency of SMBG.;Identified appropriate SMBG and/or A1C goals.;Taught/discussed recording of test results and interpretation of SMBG.;Yearly dilated eye exam  provided Ultra One Touch Mini meter and instructed on its use-BG 093   Chronic complications Relationship between chronic complications and blood glucose control;Dental care;Retinopathy and reason for yearly dilated eye exams  discussed how working night shift can interfere with BG control   Personal strategies to promote health Lifestyle issues that need to be addressed for better diabetes care      Individualized Plan for Diabetes Self-Management Training:   Learning Objective:  Patient will have a greater understanding of diabetes self-management. Patient education plan is to attend individual and/or group sessions per assessed needs and concerns.     Patient Instructions  Check blood sugars 2 x day before breakfast and 2 hrs after supper every day Exercise:  Walk  for  15  minutes   3-4 days a week Avoid sugar sweetened drinks (soda, tea, coffee, sports drinks, juices) Limit intake of fried foods and sweets Eat 3 meals day,   1  snacks a day Space meals 4-6 hours apart Make eye doctor appointment Bring blood sugar records to the next appointment/class Call your doctor for a prescription for:  1. Meter strips (type)  Ultra One Touch  test strips  checking 2 times per day  2. Lancets (type)  One Touch Delica Lancets  checking  2   times per day Get a Sharps container Return for appointment/classes on:  08-16-15    Education material provided:General Meal Planning Guidelines, Ultra One Touch Mini meter  If problems or questions, patient to contact team via:  [phone]  Future DSME appointment:  08-16-15

## 2015-07-06 NOTE — Patient Instructions (Signed)
  Check blood sugars 2 x day before breakfast and 2 hrs after supper every day Exercise:  Walk  for  15  minutes   3-4 days a week Avoid sugar sweetened drinks (soda, tea, coffee, sports drinks, juices) Limit intake of fried foods and sweets Eat 3 meals day,   1  snacks a day Space meals 4-6 hours apart Make eye doctor appointment Bring blood sugar records to the next appointment/class Call your doctor for a prescription for:  1. Meter strips (type)  Ultra One Touch test strips  checking 2 times per day  2. Lancets (type)  One Touch Delica Lancets  checking  2   times per day Get a Sharps container Return for appointment/classes on:  08-16-15

## 2015-07-09 ENCOUNTER — Telehealth: Payer: Self-pay

## 2015-07-09 MED ORDER — GLUCOSE BLOOD VI STRP: ORAL_STRIP | Status: DC

## 2015-07-09 MED ORDER — ONETOUCH DELICA LANCETS 33G MISC: Status: DC

## 2015-07-09 NOTE — Telephone Encounter (Signed)
Pt went to lifestyle and request onetouch ultra test strips and delica lancets cking BS 2 x a day to [facility]. Pt seen 05/2015. Advised done as requested.

## 2015-08-09 LAB — HM DIABETES EYE EXAM

## 2015-08-16 ENCOUNTER — Encounter: Payer: 59 | Attending: Family Medicine | Admitting: Dietician

## 2015-08-16 VITALS — Ht 69.0 in | Wt 223.7 lb

## 2015-08-16 DIAGNOSIS — E119 Type 2 diabetes mellitus without complications: Secondary | ICD-10-CM | POA: Insufficient documentation

## 2015-08-16 DIAGNOSIS — E1169 Type 2 diabetes mellitus with other specified complication: Secondary | ICD-10-CM

## 2015-08-16 DIAGNOSIS — E669 Obesity, unspecified: Secondary | ICD-10-CM

## 2015-08-16 NOTE — Progress Notes (Signed)

## 2015-08-30 ENCOUNTER — Encounter: Payer: 59 | Attending: Family Medicine | Admitting: Dietician

## 2015-08-30 VITALS — BP 124/80 | Ht 69.0 in | Wt 222.4 lb

## 2015-08-30 DIAGNOSIS — E1169 Type 2 diabetes mellitus with other specified complication: Secondary | ICD-10-CM

## 2015-08-30 DIAGNOSIS — E669 Obesity, unspecified: Secondary | ICD-10-CM

## 2015-08-30 DIAGNOSIS — E119 Type 2 diabetes mellitus without complications: Secondary | ICD-10-CM | POA: Insufficient documentation

## 2015-08-30 NOTE — Progress Notes (Signed)

## 2015-08-31 ENCOUNTER — Other Ambulatory Visit (INDEPENDENT_AMBULATORY_CARE_PROVIDER_SITE_OTHER): Payer: 59

## 2015-08-31 DIAGNOSIS — E119 Type 2 diabetes mellitus without complications: Secondary | ICD-10-CM

## 2015-08-31 DIAGNOSIS — E1169 Type 2 diabetes mellitus with other specified complication: Secondary | ICD-10-CM

## 2015-08-31 DIAGNOSIS — E785 Hyperlipidemia, unspecified: Secondary | ICD-10-CM | POA: Diagnosis not present

## 2015-08-31 DIAGNOSIS — E669 Obesity, unspecified: Secondary | ICD-10-CM | POA: Diagnosis not present

## 2015-08-31 LAB — HEMOGLOBIN A1C: Hgb A1c MFr Bld: 6 % (ref 4.6–6.5)

## 2015-08-31 LAB — LIPID PANEL
CHOL/HDL RATIO: 4
Cholesterol: 147 mg/dL (ref 0–200)
HDL: 37.2 mg/dL — AB (ref >39.00)
LDL Cholesterol: 84 mg/dL (ref 0–99)
NONHDL: 110.02
Triglycerides: 131 mg/dL (ref 0.0–149.0)
VLDL: 26.2 mg/dL (ref 0.0–40.0)

## 2015-09-07 ENCOUNTER — Ambulatory Visit: Payer: 59 | Admitting: Family Medicine

## 2015-09-08 ENCOUNTER — Encounter: Payer: Self-pay | Admitting: Family Medicine

## 2015-09-08 ENCOUNTER — Ambulatory Visit (INDEPENDENT_AMBULATORY_CARE_PROVIDER_SITE_OTHER): Payer: 59 | Admitting: Family Medicine

## 2015-09-08 VITALS — BP 120/80 | HR 58 | Temp 97.5°F | Wt 221.8 lb

## 2015-09-08 DIAGNOSIS — E119 Type 2 diabetes mellitus without complications: Secondary | ICD-10-CM | POA: Diagnosis not present

## 2015-09-08 DIAGNOSIS — E785 Hyperlipidemia, unspecified: Secondary | ICD-10-CM | POA: Diagnosis not present

## 2015-09-08 DIAGNOSIS — I10 Essential (primary) hypertension: Secondary | ICD-10-CM | POA: Diagnosis not present

## 2015-09-08 DIAGNOSIS — E669 Obesity, unspecified: Secondary | ICD-10-CM | POA: Diagnosis not present

## 2015-09-08 DIAGNOSIS — E1169 Type 2 diabetes mellitus with other specified complication: Secondary | ICD-10-CM

## 2015-09-08 NOTE — Progress Notes (Signed)
Pre visit review using our clinic review tool, if applicable. No additional management support is needed unless otherwise documented below in the visit note. 

## 2015-09-08 NOTE — Patient Instructions (Signed)
You are doing great!  Keep up the good work  Keep exercising - and increase frequency of work out days when you can  The healthier diet will continue to benefit you  I'm glad classes have helped  A1C is improved and cholesterol is improved   Follow up in 6 months with labs prior   (or earlier if needed)

## 2015-09-08 NOTE — Progress Notes (Signed)
Subjective:    Patient ID: Terry Dixon, male    DOB: 02-18-1961, 55 y.o.   MRN: [id number]  HPI Here for f/u of chronic health problems   Feeling good   Wt is down 1 lb with bmi of 32  Has been doing better with lifestyle change     Diabetes- started DM classes at [employer] sugar results -has learned what makes his glucose go up  DM diet - much improved - has cut out bread for the most part and sweets More vegetables  Does not even crave sugar anymore  Exercise - goes to the gym-not enough / tries to to after work (works nights)  Symptoms-none  A1C last  Lab Results  Component Value Date   HGBA1C 6.0 08/31/2015  down from 6.5  Thrilled with that   No problems with medications  Renal protection= on ARB Last eye exam   Cholesterol Lab Results  Component Value Date   CHOL 147 08/31/2015   CHOL 154 05/28/2015   CHOL 168 12/02/2014   Lab Results  Component Value Date   HDL 37.20* 08/31/2015   HDL 36.80* 05/28/2015   HDL 38.80* 12/02/2014   Lab Results  Component Value Date   LDLCALC 84 08/31/2015   LDLCALC 87 05/28/2015   LDLCALC 96 12/02/2014   Lab Results  Component Value Date   TRIG 131.0 08/31/2015   TRIG 151.0* 05/28/2015   TRIG 164.0* 12/02/2014   Lab Results  Component Value Date   CHOLHDL 4 08/31/2015   CHOLHDL 4 05/28/2015   CHOLHDL 4 12/02/2014   Lab Results  Component Value Date   LDLDIRECT 115.6 11/17/2010   LDLDIRECT 121.8 07/17/2007   LDLDIRECT 100.5 01/03/2007     improved - trig down and  HDL is up   Patient Active Problem List   Diagnosis Date Noted  . Prostate cancer screening 12/09/2014  . Viral URI with cough 09/03/2014  . Diabetes mellitus type 2 in obese ([place]) 12/05/2013  . Claudication ([place]) 08/06/2013  . Numbness and tingling of both legs 08/06/2013  . Steatohepatitis, non-alcoholic 35/00/9381  . Colon cancer screening 05/31/2012  . Low serum testosterone level 11/28/2011  . Routine general medical  examination at a health care facility 11/23/2011  . Family history of non-anemic vitamin B12 deficiency 05/22/2011  . TRANSAMINASES, SERUM, ELEVATED 05/23/2010  . ERECTILE DYSFUNCTION 05/26/2008  . Obesity 01/03/2007  . Hyperlipidemia 01/01/2007  . Essential hypertension 01/01/2007  . DIVERTICULOSIS, COLON 01/01/2007   Past Medical History  Diagnosis Date  . Diverticulitis     colon   . Hyperlipidemia   . Obesity   . Hypertension   . ED (erectile dysfunction)   . Low testosterone   . Asthma    Past Surgical History  Procedure Laterality Date  . Vasectomy  1999  . Hernia repair  2000    R & L   Social History  Substance Use Topics  . Smoking status: Former Research scientist (life sciences)  . Smokeless tobacco: Never Used     Comment: age 19 for 1 year   . Alcohol Use: No     Comment: rare   Family History  Problem Relation Age of Onset  . Diabetes Mother   . COPD Mother   . Benign prostatic hyperplasia Father   . Hypertension Father    Allergies  Allergen Reactions  . Dairy Aid [Lactase]   . Viagra [Sildenafil Citrate]     Leg pain   Current Outpatient Prescriptions on  File Prior to Visit  Medication Sig Dispense Refill  . albuterol (PROVENTIL HFA;VENTOLIN HFA) 108 (90 BASE) MCG/ACT inhaler Inhale 2 puffs into the lungs every 6 (six) hours as needed for wheezing or shortness of breath. 1 Inhaler 0  . benzonatate (TESSALON) 200 MG capsule Take 200 mg by mouth 3 (three) times daily as needed for cough. Reported on 07/06/2015    . cetirizine (ZYRTEC) 10 MG tablet Take 10 mg by mouth daily as needed for allergies.    . DiphenhydrAMINE HCl, Sleep, (ZZZQUIL) 25 MG CAPS Take by mouth at bedtime as needed. Reported on 07/06/2015    . DULERA 100-5 MCG/ACT AERO   10  . EPIPEN 2-PAK 0.3 MG/0.3ML SOAJ injection See admin instructions. Reported on 07/06/2015-use as needed  11  . fluticasone (FLONASE) 50 MCG/ACT nasal spray Place 2 sprays into both nostrils daily.   19  . glucose blood (ONE TOUCH  ULTRA TEST) test strip Check blood sugar twice a day and as directed. Dx E11.9 100 each 5  . guaiFENesin (MUCINEX) 600 MG 12 hr tablet Take 1 tablet (600 mg total) by mouth 2 (two) times daily. 60 tablet 0  . guaiFENesin-codeine (ROBITUSSIN AC) 100-10 MG/5ML syrup Take 5 mLs by mouth 3 (three) times daily as needed for cough. Reported on 07/06/2015    . losartan (COZAAR) 100 MG tablet TAKE 1 TABLET (100 MG TOTAL) BY MOUTH DAILY. 90 tablet 3  . MELATONIN MAXIMUM STRENGTH PO Take 10 mg by mouth at bedtime as needed.    . Multiple Vitamin (DAILY MULTIVITAMIN PO) Take 1 tablet by mouth daily.     . Omega-3 Fatty Acids (FISH OIL) 1200 MG CAPS Take by mouth daily.      Glory Rosebush DELICA LANCETS 48A MISC Check blood sugar twice a day and as directed. Dx E11.9 100 each 5  . pseudoephedrine (SUDAFED) 120 MG 12 hr tablet Take 120 mg by mouth 2 (two) times daily as needed. Reported on 07/06/2015    . tadalafil (CIALIS) 5 MG tablet Take 1 tablet (5 mg total) by mouth daily as needed for erectile dysfunction. 10 tablet 5   No current facility-administered medications on file prior to visit.    Review of Systems Review of Systems  Constitutional: Negative for fever, appetite change, fatigue and unexpected weight change.  Eyes: Negative for pain and visual disturbance.  Respiratory: Negative for cough and shortness of breath.   Cardiovascular: Negative for cp or palpitations    Gastrointestinal: Negative for nausea, diarrhea and constipation.  Genitourinary: Negative for urgency and frequency.  Skin: Negative for pallor or rash   Neurological: Negative for weakness, light-headedness, numbness and headaches.  Hematological: Negative for adenopathy. Does not bruise/bleed easily.  Psychiatric/Behavioral: Negative for dysphoric mood. The patient is not nervous/anxious.         Objective:   Physical Exam  Constitutional: He appears well-developed and well-nourished. No distress.  obese and well appearing    HENT:  Head: Normocephalic and atraumatic.  Mouth/Throat: Oropharynx is clear and moist.  Eyes: Conjunctivae and EOM are normal. Pupils are equal, round, and reactive to light.  Neck: Normal range of motion. Neck supple. No JVD present. Carotid bruit is not present. No thyromegaly present.  Cardiovascular: Normal rate, regular rhythm, normal heart sounds and intact distal pulses.  Exam reveals no gallop.   Pulmonary/Chest: Effort normal and breath sounds normal. No respiratory distress. He has no wheezes. He has no rales.  No crackles  Abdominal: Soft. Bowel sounds  are normal. He exhibits no distension, no abdominal bruit and no mass. There is no tenderness.  Musculoskeletal: He exhibits no edema.  Lymphadenopathy:    He has no cervical adenopathy.  Neurological: He is alert. He has normal reflexes.  Skin: Skin is warm and dry. No rash noted.  Psychiatric: He has a normal mood and affect.          Assessment & Plan:   Problem List Items Addressed This Visit      Cardiovascular and Mediastinum   Essential hypertension - Primary    bp in fair control at this time  BP Readings from Last 1 Encounters:  09/08/15 120/80   No changes needed Disc lifstyle change with low sodium diet and exercise  Labs reviewed         Endocrine   Diabetes mellitus type 2 in obese ([place])    Improved after DM teaching and lifestyle change Lab Results  Component Value Date   HGBA1C 6.0 08/31/2015   Commended  Will continue to work on this and f/u 6 mo        Other   Hyperlipidemia    Disc goals for lipids and reasons to control them Rev labs with pt Rev low sat fat diet in detail HDL is up and trig are down  Continue to follow       Obesity    Discussed how this problem influences overall health and the risks it imposes  Reviewed plan for weight loss with lower calorie diet (via better food choices and also portion control or program like weight watchers) and exercise building up to  or more than 30 minutes 5 days per week including some aerobic activity   Commended on hard work so far

## 2015-09-09 NOTE — Assessment & Plan Note (Signed)
Disc goals for lipids and reasons to control them Rev labs with pt Rev low sat fat diet in detail HDL is up and trig are down  Continue to follow

## 2015-09-09 NOTE — Assessment & Plan Note (Signed)
Discussed how this problem influences overall health and the risks it imposes  Reviewed plan for weight loss with lower calorie diet (via better food choices and also portion control or program like weight watchers) and exercise building up to or more than 30 minutes 5 days per week including some aerobic activity   Commended on hard work so far

## 2015-09-09 NOTE — Assessment & Plan Note (Signed)
bp in fair control at this time  BP Readings from Last 1 Encounters:  09/08/15 120/80   No changes needed Disc lifstyle change with low sodium diet and exercise  Labs reviewed

## 2015-09-09 NOTE — Assessment & Plan Note (Signed)
Improved after DM teaching and lifestyle change Lab Results  Component Value Date   HGBA1C 6.0 08/31/2015   Commended  Will continue to work on this and f/u 6 mo

## 2015-09-13 ENCOUNTER — Encounter: Payer: 59 | Admitting: Dietician

## 2015-09-13 VITALS — Wt 222.4 lb

## 2015-09-13 DIAGNOSIS — E119 Type 2 diabetes mellitus without complications: Secondary | ICD-10-CM

## 2015-09-13 NOTE — Progress Notes (Signed)
Appt. Start Time: 1730 Appt. End Time: 2030  Class 2 Diabetes Overview - define DM; state own type of DM; identify functions of pancreas and insulin; define insulin deficiency vs insulin resistance  Psychosocial - identify DM as a source of stress; state the effects of stress on BG control; verbalize appropriate stress management techniques; identify personal stress issues   Nutritional Management - describe effects of food on blood glucose; identify sources of carbohydrate, protein and fat; verbalize the importance of balance meals in controlling blood glucose; identify meals as well balanced or not; estimate servings of carbohydrate from menus; use food labels to identify servings size, content of carbohydrate, fiber, protein, fat, saturated fat and sodium; recognize food sources of fat, saturated fat, trans fat, sodium and verbalize goals for intake; describe healthful appropriate food choices when dining out   Exercise - describe the effects of exercise on blood glucose and importance of regular exercise in controlling diabetes; state a plan for personal exercise; verbalize contraindications for exercise  Medications - state name, dose, timing of currently prescribed medications; describe types of medications available for diabetes  Self-Monitoring - state importance of HBGM and demo procedure accurately; use HBGM results to effectively manage diabetes; identify importance of regular HbA1C testing and goals for results  Acute Complications/Sick Day Guidelines - recognize hyperglycemia and hypoglycemia with causes and effects; identify blood glucose results as high, low or in control; list steps in treating and preventing high and low blood glucose; state appropriate measure to manage blood glucose when ill (need for meds, HBGM plan, when to call physician, need for fluids)  Chronic Complications/Foot, Skin, Eye Dental Care - identify possible long-term complications of diabetes (retinopathy,  neuropathy, nephropathy, cardiovascular disease, infections); explain steps in prevention and treatment of chronic complications; state importance of daily self-foot exams; describe how to examine feet and what to look for; explain appropriate eye and dental care  Lifestyle Changes/Goals & Health/Community Resources - state benefits of making appropriate lifestyle changes; identify habits that need to change (meals, tobacco, alcohol); identify strategies to reduce risk factors for personal health; set goals for proper diabetes care; state need for and frequency of healthcare follow-up; describe appropriate community resources for good health (ADA, web sites, apps)   Pregnancy/Sexual Health - define gestational diabetes; state importance of good blood glucose control and birth control prior to pregnancy; state importance of good blood glucose control in preventing sexual problems (impotence, vaginal dryness, infections, loss of desire); state relationship of blood glucose control and pregnancy outcome; describe risk of maternal and fetal complications  Teaching Materials Used: Class 2 Slide Packet A1C Pamphlet Foot Care Literature Menu Ideas Goals for Class 2

## 2015-09-21 ENCOUNTER — Encounter: Payer: Self-pay | Admitting: Dietician

## 2015-12-05 ENCOUNTER — Other Ambulatory Visit: Payer: Self-pay | Admitting: Family Medicine

## 2016-02-26 ENCOUNTER — Other Ambulatory Visit: Payer: Self-pay | Admitting: Family Medicine

## 2016-03-06 ENCOUNTER — Other Ambulatory Visit (INDEPENDENT_AMBULATORY_CARE_PROVIDER_SITE_OTHER): Payer: 59

## 2016-03-06 DIAGNOSIS — E1169 Type 2 diabetes mellitus with other specified complication: Secondary | ICD-10-CM

## 2016-03-06 DIAGNOSIS — E785 Hyperlipidemia, unspecified: Secondary | ICD-10-CM

## 2016-03-06 DIAGNOSIS — I10 Essential (primary) hypertension: Secondary | ICD-10-CM

## 2016-03-06 DIAGNOSIS — E119 Type 2 diabetes mellitus without complications: Secondary | ICD-10-CM | POA: Diagnosis not present

## 2016-03-06 DIAGNOSIS — E669 Obesity, unspecified: Secondary | ICD-10-CM

## 2016-03-06 LAB — COMPREHENSIVE METABOLIC PANEL
ALBUMIN: 4.6 g/dL (ref 3.5–5.2)
ALT: 65 U/L — AB (ref 0–53)
AST: 38 U/L — AB (ref 0–37)
Alkaline Phosphatase: 80 U/L (ref 39–117)
BILIRUBIN TOTAL: 1.2 mg/dL (ref 0.2–1.2)
BUN: 14 mg/dL (ref 6–23)
CALCIUM: 9 mg/dL (ref 8.4–10.5)
CO2: 26 mEq/L (ref 19–32)
CREATININE: 1.07 mg/dL (ref 0.40–1.50)
Chloride: 101 mEq/L (ref 96–112)
GFR: 76.52 mL/min (ref >60.00)
Glucose, Bld: 109 mg/dL — ABNORMAL HIGH (ref 70–99)
Potassium: 3.9 mEq/L (ref 3.5–5.1)
Sodium: 136 mEq/L (ref 135–145)
Total Protein: 7.1 g/dL (ref 6.0–8.3)

## 2016-03-06 LAB — LIPID PANEL
CHOLESTEROL: 151 mg/dL (ref 0–200)
HDL: 38.4 mg/dL — ABNORMAL LOW (ref >39.00)
LDL Cholesterol: 85 mg/dL (ref 0–99)
NonHDL: 112.75
TRIGLYCERIDES: 139 mg/dL (ref 0.0–149.0)
Total CHOL/HDL Ratio: 4
VLDL: 27.8 mg/dL (ref 0.0–40.0)

## 2016-03-06 LAB — HEMOGLOBIN A1C: HEMOGLOBIN A1C: 6 % (ref 4.6–6.5)

## 2016-03-10 ENCOUNTER — Ambulatory Visit (INDEPENDENT_AMBULATORY_CARE_PROVIDER_SITE_OTHER): Payer: 59 | Admitting: Family Medicine

## 2016-03-10 ENCOUNTER — Encounter: Payer: Self-pay | Admitting: Family Medicine

## 2016-03-10 VITALS — BP 122/68 | HR 55 | Temp 97.8°F | Wt 219.8 lb

## 2016-03-10 DIAGNOSIS — I10 Essential (primary) hypertension: Secondary | ICD-10-CM | POA: Diagnosis not present

## 2016-03-10 DIAGNOSIS — R7402 Elevation of levels of lactic acid dehydrogenase (LDH): Secondary | ICD-10-CM

## 2016-03-10 DIAGNOSIS — R739 Hyperglycemia, unspecified: Secondary | ICD-10-CM

## 2016-03-10 DIAGNOSIS — R740 Nonspecific elevation of levels of transaminase and lactic acid dehydrogenase [LDH]: Secondary | ICD-10-CM

## 2016-03-10 DIAGNOSIS — E785 Hyperlipidemia, unspecified: Secondary | ICD-10-CM

## 2016-03-10 DIAGNOSIS — R7401 Elevation of levels of liver transaminase levels: Secondary | ICD-10-CM

## 2016-03-10 DIAGNOSIS — E669 Obesity, unspecified: Secondary | ICD-10-CM | POA: Diagnosis not present

## 2016-03-10 NOTE — Assessment & Plan Note (Signed)
Disc goals for lipids and reasons to control them Rev labs with pt Rev low sat fat diet in detail This is stable LDL is at goal  Will work on exercise to raise HDL

## 2016-03-10 NOTE — Assessment & Plan Note (Signed)
Discussed how this problem influences overall health and the risks it imposes  Reviewed plan for weight loss with lower calorie diet (via better food choices and also portion control or program like weight watchers) and exercise building up to or more than 30 minutes 5 days per week including some aerobic activity    

## 2016-03-10 NOTE — Patient Instructions (Addendum)
I'm glad you are doing well  Blood pressure is good and weight is gradually coming down  Start adding exercise with the goal of 30 minutes 5 days per week- walking or whatever else you want to do  Keep eating a diabetic diet  Follow up in 6 months for your annual exam with labs prior

## 2016-03-10 NOTE — Assessment & Plan Note (Signed)
From fatty liver Stable  Urged to continue loosing weight

## 2016-03-10 NOTE — Progress Notes (Signed)
Subjective:    Patient ID: Terry Dixon, male    DOB: [birth year], 55 y.o.   MRN: [id number]  HPI Here for f/u of chronic medical problems   Wt Readings from Last 3 Encounters:  03/10/16 219 lb 12 oz (99.7 kg)  09/13/15 222 lb 6.4 oz (100.9 kg)  09/08/15 221 lb 12.8 oz (100.6 kg)  happy that his weight is going down  Eating better - no sweets and no snacks (keeps the processed carbs out of the house)- more green vegetables (and no sugar drinks) Still needs to exercise - but has been starting to walk on days off  bmi is 32/4  bp is stable today  No cp or palpitations or headaches or edema  No side effects to medicines  BP Readings from Last 3 Encounters:  03/10/16 122/68  09/08/15 120/80  08/30/15 124/80     Hyperlipidemia Lab Results  Component Value Date   CHOL 151 03/06/2016   CHOL 147 08/31/2015   CHOL 154 05/28/2015   Lab Results  Component Value Date   HDL 38.40 (L) 03/06/2016   HDL 37.20 (L) 08/31/2015   HDL 36.80 (L) 05/28/2015   Lab Results  Component Value Date   LDLCALC 85 03/06/2016   LDLCALC 84 08/31/2015   LDLCALC 87 05/28/2015   Lab Results  Component Value Date   TRIG 139.0 03/06/2016   TRIG 131.0 08/31/2015   TRIG 151.0 (H) 05/28/2015   Lab Results  Component Value Date   CHOLHDL 4 03/06/2016   CHOLHDL 4 08/31/2015   CHOLHDL 4 05/28/2015   Lab Results  Component Value Date   LDLDIRECT 115.6 11/17/2010   LDLDIRECT 121.8 07/17/2007   LDLDIRECT 100.5 01/03/2007    Doing well with cholesterol   DM2 Lab Results  Component Value Date   HGBA1C 6.0 03/06/2016   This is the same as last time  On arb for renal protection  No longer in DM range     Chemistry      Component Value Date/Time   NA 136 03/06/2016 0831   K 3.9 03/06/2016 0831   CL 101 03/06/2016 0831   CO2 26 03/06/2016 0831   BUN 14 03/06/2016 0831   CREATININE 1.07 03/06/2016 0831      Component Value Date/Time   CALCIUM 9.0 03/06/2016 0831   ALKPHOS 80  03/06/2016 0831   AST 38 (H) 03/06/2016 0831   ALT 65 (H) 03/06/2016 0831   BILITOT 1.2 03/06/2016 0831     has hx of fatty liver Ast/alt are the same as last time   Patient Active Problem List   Diagnosis Date Noted  . Prostate cancer screening 12/09/2014  . Claudication ([place]) 08/06/2013  . Numbness and tingling of both legs 08/06/2013  . Steatohepatitis, non-alcoholic 16/60/6301  . Colon cancer screening 05/31/2012  . Low serum testosterone level 11/28/2011  . Routine general medical examination at a health care facility 11/23/2011  . Family history of non-anemic vitamin B12 deficiency 05/22/2011  . TRANSAMINASES, SERUM, ELEVATED 05/23/2010  . ERECTILE DYSFUNCTION 05/26/2008  . Obesity 01/03/2007  . Hyperlipidemia 01/01/2007  . Essential hypertension 01/01/2007  . DIVERTICULOSIS, COLON 01/01/2007   Past Medical History:  Diagnosis Date  . Asthma   . Diverticulitis    colon   . ED (erectile dysfunction)   . Hyperlipidemia   . Hypertension   . Low testosterone   . Obesity    Past Surgical History:  Procedure Laterality Date  . HERNIA REPAIR  2000  [place]   Social History  Substance Use Topics  . Smoking status: Former Research scientist (life sciences)  . Smokeless tobacco: Never Used     Comment: age 32 for 1 year   . Alcohol use No     Comment: rare   Family History  Problem Relation Age of Onset  . Diabetes Mother   . COPD Mother   . Benign prostatic hyperplasia Father   . Hypertension Father    Allergies  Allergen Reactions  . Dairy Aid [Lactase]   . Viagra [Sildenafil Citrate]     Leg pain   Current Outpatient Prescriptions on File Prior to Visit  Medication Sig Dispense Refill  . albuterol (PROVENTIL HFA;VENTOLIN HFA) 108 (90 BASE) MCG/ACT inhaler Inhale 2 puffs into the lungs every 6 (six) hours as needed for wheezing or shortness of breath. 1 Inhaler 0  . benzonatate (TESSALON) 200 MG capsule Take 200 mg by mouth 3 (three) times daily as needed for  cough. Reported on 07/06/2015    . cetirizine (ZYRTEC) 10 MG tablet Take 10 mg by mouth daily as needed for allergies.    . DiphenhydrAMINE HCl, Sleep, (ZZZQUIL) 25 MG CAPS Take by mouth at bedtime as needed. Reported on 07/06/2015    . DULERA 100-5 MCG/ACT AERO   10  . EPIPEN 2-PAK 0.3 MG/0.3ML SOAJ injection See admin instructions. Reported on 07/06/2015-use as needed  11  . fluticasone (FLONASE) 50 MCG/ACT nasal spray Place 2 sprays into both nostrils daily.   19  . glucose blood (ONE TOUCH ULTRA TEST) test strip Check blood sugar twice a day and as directed. Dx E11.9 100 each 5  . guaiFENesin (MUCINEX) 600 MG 12 hr tablet Take 1 tablet (600 mg total) by mouth 2 (two) times daily. 60 tablet 0  . losartan (COZAAR) 100 MG tablet TAKE 1 TABLET (100 MG TOTAL) BY MOUTH DAILY. 90 tablet 0  . MELATONIN MAXIMUM STRENGTH PO Take 10 mg by mouth at bedtime as needed.    . Multiple Vitamin (DAILY MULTIVITAMIN PO) Take 1 tablet by mouth daily.     . Omega-3 Fatty Acids (FISH OIL) 1200 MG CAPS Take by mouth daily.      Glory [place]ush DELICA LANCETS 78L MISC Check blood sugar twice a day and as directed. Dx E11.9 100 each 5  . pseudoephedrine (SUDAFED) 120 MG 12 hr tablet Take 120 mg by mouth 2 (two) times daily as needed. Reported on 07/06/2015    . tadalafil (CIALIS) 5 MG tablet Take 1 tablet (5 mg total) by mouth daily as needed for erectile dysfunction. 10 tablet 5   No current facility-administered medications on file prior to visit.     Review of Systems    Review of Systems  Constitutional: Negative for fever, appetite change, fatigue and unexpected weight change.  Eyes: Negative for pain and visual disturbance.  Respiratory: Negative for cough and shortness of breath.   Cardiovascular: Negative for cp or palpitations    Gastrointestinal: Negative for nausea, diarrhea and constipation.  Genitourinary: Negative for urgency and frequency.  Skin: Negative for pallor or rash   Neurological: Negative  for weakness, light-headedness, numbness and headaches.  Hematological: Negative for adenopathy. Does not bruise/bleed easily.  Psychiatric/Behavioral: Negative for dysphoric mood. The patient is not nervous/anxious.      Objective:   Physical Exam  Constitutional: He appears well-developed and well-nourished. No distress.  obese and well appearing   HENT:  Head: Normocephalic and atraumatic.  Mouth/Throat: Oropharynx is clear and moist.  Eyes: Conjunctivae and EOM are normal. Pupils are equal, round, and reactive to light.  Neck: Normal range of motion. Neck supple. No JVD present. Carotid bruit is not present. No thyromegaly present.  Cardiovascular: Normal rate, regular rhythm, normal heart sounds and intact distal pulses.  Exam reveals no gallop.   Pulmonary/Chest: Effort normal and breath sounds normal. No respiratory distress. He has no wheezes. He has no rales.  No crackles  Abdominal: Soft. Bowel sounds are normal. He exhibits no distension, no abdominal bruit and no mass. There is no hepatosplenomegaly. There is no tenderness.  Musculoskeletal: He exhibits no edema.  Lymphadenopathy:    He has no cervical adenopathy.  Neurological: He is alert. He has normal reflexes.  Skin: Skin is warm and dry. No rash noted.  Psychiatric: He has a normal mood and affect.          Assessment & Plan:   Problem List Items Addressed This Visit      Cardiovascular and Mediastinum   Essential hypertension - Primary    bp in fair control at this time  BP Readings from Last 1 Encounters:  03/10/16 122/68   No changes needed Disc lifstyle change with low sodium diet and exercise  Labs reviewed F/u 6 mo        Other   TRANSAMINASES, SERUM, ELEVATED    From fatty liver Stable  Urged to continue loosing weight       Obesity    Discussed how this problem influences overall health and the risks it imposes  Reviewed plan for weight loss with lower calorie diet (via better food  choices and also portion control or program like weight watchers) and exercise building up to or more than 30 minutes 5 days per week including some aerobic activity         Hyperlipidemia    Disc goals for lipids and reasons to control them Rev labs with pt Rev low sat fat diet in detail This is stable LDL is at goal  Will work on exercise to raise HDL      Hyperglycemia    Stable with diet control and wt loss Lab Results  Component Value Date   HGBA1C 6.0 03/06/2016   Enc good habits/ low glycemic diet and further wt loss to goal to prevent DM2       Other Visit Diagnoses   None.

## 2016-03-10 NOTE — Progress Notes (Signed)
Pre visit review using our clinic review tool, if applicable. No additional management support is needed unless otherwise documented below in the visit note. 

## 2016-03-10 NOTE — Assessment & Plan Note (Signed)
bp in fair control at this time  BP Readings from Last 1 Encounters:  03/10/16 122/68   No changes needed Disc lifstyle change with low sodium diet and exercise  Labs reviewed F/u 6 mo

## 2016-03-11 DIAGNOSIS — R7303 Prediabetes: Secondary | ICD-10-CM | POA: Insufficient documentation

## 2016-03-11 DIAGNOSIS — E119 Type 2 diabetes mellitus without complications: Secondary | ICD-10-CM | POA: Insufficient documentation

## 2016-03-11 NOTE — Assessment & Plan Note (Signed)
Stable with diet control and wt loss Lab Results  Component Value Date   HGBA1C 6.0 03/06/2016   Enc good habits/ low glycemic diet and further wt loss to goal to prevent DM2

## 2016-04-10 ENCOUNTER — Telehealth: Payer: Self-pay | Admitting: Cardiovascular Disease

## 2016-04-10 NOTE — Telephone Encounter (Signed)
3 attempts to schedule fu from recall list.  Deleting recall.  °

## 2016-05-26 ENCOUNTER — Other Ambulatory Visit: Payer: Self-pay | Admitting: Family Medicine

## 2016-07-26 DIAGNOSIS — J301 Allergic rhinitis due to pollen: Secondary | ICD-10-CM | POA: Diagnosis not present

## 2016-08-14 DIAGNOSIS — J301 Allergic rhinitis due to pollen: Secondary | ICD-10-CM | POA: Diagnosis not present

## 2016-08-28 DIAGNOSIS — J301 Allergic rhinitis due to pollen: Secondary | ICD-10-CM | POA: Diagnosis not present

## 2016-09-05 ENCOUNTER — Other Ambulatory Visit (INDEPENDENT_AMBULATORY_CARE_PROVIDER_SITE_OTHER): Payer: 59

## 2016-09-05 ENCOUNTER — Other Ambulatory Visit: Payer: 59

## 2016-09-05 DIAGNOSIS — R739 Hyperglycemia, unspecified: Secondary | ICD-10-CM

## 2016-09-05 DIAGNOSIS — I10 Essential (primary) hypertension: Secondary | ICD-10-CM | POA: Diagnosis not present

## 2016-09-05 DIAGNOSIS — E785 Hyperlipidemia, unspecified: Secondary | ICD-10-CM

## 2016-09-05 LAB — COMPREHENSIVE METABOLIC PANEL
ALBUMIN: 4.6 g/dL (ref 3.5–5.2)
ALK PHOS: 70 U/L (ref 39–117)
ALT: 60 U/L — ABNORMAL HIGH (ref 0–53)
AST: 29 U/L (ref 0–37)
BUN: 14 mg/dL (ref 6–23)
CO2: 28 mEq/L (ref 19–32)
CREATININE: 0.97 mg/dL (ref 0.40–1.50)
Calcium: 9.1 mg/dL (ref 8.4–10.5)
Chloride: 104 mEq/L (ref 96–112)
GFR: 85.53 mL/min (ref >60.00)
Glucose, Bld: 96 mg/dL (ref 70–99)
POTASSIUM: 3.8 meq/L (ref 3.5–5.1)
SODIUM: 138 meq/L (ref 135–145)
TOTAL PROTEIN: 7 g/dL (ref 6.0–8.3)
Total Bilirubin: 0.8 mg/dL (ref 0.2–1.2)

## 2016-09-05 LAB — LIPID PANEL
CHOL/HDL RATIO: 4
Cholesterol: 154 mg/dL (ref 0–200)
HDL: 36.8 mg/dL — AB (ref >39.00)
LDL Cholesterol: 84 mg/dL (ref 0–99)
NONHDL: 116.77
Triglycerides: 164 mg/dL — ABNORMAL HIGH (ref 0.0–149.0)
VLDL: 32.8 mg/dL (ref 0.0–40.0)

## 2016-09-05 LAB — HEMOGLOBIN A1C: Hgb A1c MFr Bld: 6.2 % (ref 4.6–6.5)

## 2016-09-06 DIAGNOSIS — J301 Allergic rhinitis due to pollen: Secondary | ICD-10-CM | POA: Diagnosis not present

## 2016-09-12 ENCOUNTER — Ambulatory Visit (INDEPENDENT_AMBULATORY_CARE_PROVIDER_SITE_OTHER): Payer: 59 | Admitting: Family Medicine

## 2016-09-12 ENCOUNTER — Encounter: Payer: Self-pay | Admitting: Family Medicine

## 2016-09-12 VITALS — BP 130/70 | HR 66 | Temp 98.3°F | Ht 69.25 in | Wt 225.0 lb

## 2016-09-12 DIAGNOSIS — E7800 Pure hypercholesterolemia, unspecified: Secondary | ICD-10-CM | POA: Diagnosis not present

## 2016-09-12 DIAGNOSIS — K7581 Nonalcoholic steatohepatitis (NASH): Secondary | ICD-10-CM

## 2016-09-12 DIAGNOSIS — I10 Essential (primary) hypertension: Secondary | ICD-10-CM

## 2016-09-12 DIAGNOSIS — J301 Allergic rhinitis due to pollen: Secondary | ICD-10-CM | POA: Diagnosis not present

## 2016-09-12 DIAGNOSIS — Z125 Encounter for screening for malignant neoplasm of prostate: Secondary | ICD-10-CM

## 2016-09-12 DIAGNOSIS — R739 Hyperglycemia, unspecified: Secondary | ICD-10-CM | POA: Diagnosis not present

## 2016-09-12 DIAGNOSIS — Z0000 Encounter for general adult medical examination without abnormal findings: Secondary | ICD-10-CM

## 2016-09-12 DIAGNOSIS — E6609 Other obesity due to excess calories: Secondary | ICD-10-CM | POA: Diagnosis not present

## 2016-09-12 DIAGNOSIS — Z6832 Body mass index (BMI) 32.0-32.9, adult: Secondary | ICD-10-CM

## 2016-09-12 DIAGNOSIS — Z23 Encounter for immunization: Secondary | ICD-10-CM

## 2016-09-12 MED ORDER — LOSARTAN POTASSIUM 100 MG PO TABS: ORAL_TABLET | ORAL | 3 refills | Status: DC

## 2016-09-12 MED ORDER — ONETOUCH DELICA LANCETS 33G MISC: 3 refills | Status: DC

## 2016-09-12 MED ORDER — GLUCOSE BLOOD VI STRP: ORAL_STRIP | 3 refills | Status: DC

## 2016-09-12 NOTE — Progress Notes (Signed)
Subjective:    Patient ID: Terry Dixon, male    DOB: 04/12/1961, 56 y.o.   MRN: [id number]  HPI Here for health maintenance exam and to review chronic medical problems    Working a lot  Feeling fine   Wt Readings from Last 3 Encounters:  09/12/16 225 lb (102.1 kg)  03/10/16 219 lb 12 oz (99.7 kg)  09/13/15 222 lb 6.4 oz (100.9 kg)  wt is up 5lb  Not doing anything differently  Impossible to fit in exercise due to long work hours - but he is expecting a lay off / co merged with another co - but he feels good about it and would like daytime work  On pm shift for 17 years  Eating very healthy - proud of this (a lot of vegetables)  bmi is 32.9  Hep C/ HIV screening -declined previously  Eye exam -today at 1:30    Flu vaccine -getting today  Tetanus vaccine 10/10   Colonoscopy/ screening 1/14 colonoscopy neg with 51 y recall  No bowel problems    Prostate cancer screening  Father had prostate cancer in advanced age - was successfully treated  Last urology visit - 3 years ago  He took testosterone in the past -no longer on it / did not like the side effects  No urinary symptoms  Gets up after 4 hours to urinate - but it depends on how much coffee he drinks  At least 4 12 oz cups of coffee  Much less on his days off  Drinks a lot of water  No soft drinks at all    bp is stable today  No cp or palpitations or headaches or edema  No side effects to medicines  BP Readings from Last 3 Encounters:  09/12/16 130/70  03/10/16 122/68  09/08/15 120/80     On losartan   Hx of NASH   Chemistry      Component Value Date/Time   NA 138 09/05/2016 0902   K 3.8 09/05/2016 0902   CL 104 09/05/2016 0902   CO2 28 09/05/2016 0902   BUN 14 09/05/2016 0902   CREATININE 0.97 09/05/2016 0902      Component Value Date/Time   CALCIUM 9.1 09/05/2016 0902   ALKPHOS 70 09/05/2016 0902   AST 29 09/05/2016 0902   ALT 60 (H) 09/05/2016 0902   BILITOT 0.8 09/05/2016 0902     AST is nl and ALT is down 5 pt  He has been to a liver clinic  No fibrosis noted in 2016  Biggest weakness is pizza    Hx of hyperlipidemia Lab Results  Component Value Date   CHOL 154 09/05/2016   CHOL 151 03/06/2016   CHOL 147 08/31/2015   Lab Results  Component Value Date   HDL 36.80 (L) 09/05/2016   HDL 38.40 (L) 03/06/2016   HDL 37.20 (L) 08/31/2015   Lab Results  Component Value Date   LDLCALC 84 09/05/2016   LDLCALC 85 03/06/2016   LDLCALC 84 08/31/2015   Lab Results  Component Value Date   TRIG 164.0 (H) 09/05/2016   TRIG 139.0 03/06/2016   TRIG 131.0 08/31/2015   Lab Results  Component Value Date   CHOLHDL 4 09/05/2016   CHOLHDL 4 03/06/2016   CHOLHDL 4 08/31/2015   Lab Results  Component Value Date   LDLDIRECT 115.6 11/17/2010   LDLDIRECT 121.8 07/17/2007   LDLDIRECT 100.5 01/03/2007  LDL great  HDL could improve  Trig up  a bit   Hx of hyperglycemia Lab Results  Component Value Date   HGBA1C 6.2 09/05/2016   This is up from 6.1 Controlling sugars very well  Staying away from refined carbs  Portions are controlled  Eating a lot of veg/some fruit No sugar drinks   Patient Active Problem List   Diagnosis Date Noted  . Hyperglycemia 03/11/2016  . Prostate cancer screening 12/09/2014  . Steatohepatitis, non-alcoholic 44/07/270  . Colon cancer screening 05/31/2012  . Low serum testosterone level 11/28/2011  . Routine general medical examination at a health care facility 11/23/2011  . Family history of non-anemic vitamin B12 deficiency 05/22/2011  . TRANSAMINASES, SERUM, ELEVATED 05/23/2010  . ERECTILE DYSFUNCTION 05/26/2008  . Obesity 01/03/2007  . Hyperlipidemia 01/01/2007  . Essential hypertension 01/01/2007  . DIVERTICULOSIS, COLON 01/01/2007   Past Medical History:  Diagnosis Date  . Asthma   . Diverticulitis    colon   . ED (erectile dysfunction)   . Hyperlipidemia   . Hypertension   . Low testosterone   . Obesity    Past  Surgical History:  Procedure Laterality Date  . [place]   Social History  Substance Use Topics  . Smoking status: Former Research scientist (life sciences)  . Smokeless tobacco: Never Used     Comment: age 69 for 1 year   . Alcohol use No     Comment: rare   Family History  Problem Relation Age of Onset  . Diabetes Mother   . COPD Mother   . Benign prostatic hyperplasia Father   . Hypertension Father    Allergies  Allergen Reactions  . Dairy Aid [Lactase]   . Viagra [Sildenafil Citrate]     Leg pain   Current Outpatient Prescriptions on File Prior to Visit  Medication Sig Dispense Refill  . albuterol (PROVENTIL HFA;VENTOLIN HFA) 108 (90 BASE) MCG/ACT inhaler Inhale 2 puffs into the lungs every 6 (six) hours as needed for wheezing or shortness of breath. 1 Inhaler 0  . benzonatate (TESSALON) 200 MG capsule Take 200 mg by mouth 3 (three) times daily as needed for cough. Reported on 07/06/2015    . cetirizine (ZYRTEC) 10 MG tablet Take 10 mg by mouth daily as needed for allergies.    . DiphenhydrAMINE HCl, Sleep, (ZZZQUIL) 25 MG CAPS Take by mouth at bedtime as needed. Reported on 07/06/2015    . DULERA 100-5 MCG/ACT AERO   10  . EPIPEN 2-PAK 0.3 MG/0.3ML SOAJ injection See admin instructions. Reported on 07/06/2015-use as needed  11  . fluticasone (FLONASE) 50 MCG/ACT nasal spray Place 2 sprays into both nostrils daily.   19  . guaiFENesin (MUCINEX) 600 MG 12 hr tablet Take 1 tablet (600 mg total) by mouth 2 (two) times daily. 60 tablet 0  . MELATONIN MAXIMUM STRENGTH PO Take 10 mg by mouth at bedtime as needed.    . Multiple Vitamin (DAILY MULTIVITAMIN PO) Take 1 tablet by mouth daily.     . Omega-3 Fatty Acids (FISH OIL) 1200 MG CAPS Take by mouth daily.      . pseudoephedrine (SUDAFED) 120 MG 12 hr tablet Take 120 mg by mouth 2 (two) times daily as needed. Reported on 07/06/2015    . tadalafil (CIALIS) 5 MG tablet Take 1 tablet (5 mg total) by mouth daily as  needed for erectile dysfunction. 10 tablet 5   No current facility-administered medications on file prior to visit.  Review of Systems Review of Systems  Constitutional: Negative for fever, appetite change, fatigue and unexpected weight change.  Eyes: Negative for pain and visual disturbance.  Respiratory: Negative for cough and shortness of breath.   Cardiovascular: Negative for cp or palpitations    Gastrointestinal: Negative for nausea, diarrhea and constipation.  Genitourinary: Negative for urgency and frequency.  Skin: Negative for pallor or rash   Neurological: Negative for weakness, light-headedness, numbness and headaches.  Hematological: Negative for adenopathy. Does not bruise/bleed easily.  Psychiatric/Behavioral: Negative for dysphoric mood. The patient is not nervous/anxious.         Objective:   Physical Exam  Constitutional: He is oriented to person, place, and time. He appears well-developed and well-nourished. No distress.  obese and well appearing   HENT:  Head: Normocephalic and atraumatic.  Right Ear: External ear normal.  Left Ear: External ear normal.  Nose: Nose normal.  Mouth/Throat: Oropharynx is clear and moist.  Eyes: Conjunctivae and EOM are normal. Pupils are equal, round, and reactive to light. Right eye exhibits no discharge. Left eye exhibits no discharge. No scleral icterus.  Neck: Normal range of motion. Neck supple. No JVD present. Carotid bruit is not present. No thyromegaly present.  Cardiovascular: Normal rate, regular rhythm, normal heart sounds and intact distal pulses.  Exam reveals no gallop.   Pulmonary/Chest: Effort normal and breath sounds normal. No respiratory distress. He has no wheezes. He has no rales.  Abdominal: Soft. Bowel sounds are normal. He exhibits no distension, no abdominal bruit and no mass. There is no tenderness.  Genitourinary: Prostate normal.  Genitourinary Comments: Prostate is nl size/symmetric/firm and nt     Musculoskeletal: Normal range of motion. He exhibits no edema or tenderness.  Lymphadenopathy:    He has no cervical adenopathy.  Neurological: He is alert and oriented to person, place, and time. He has normal reflexes. No cranial nerve deficit. He exhibits normal muscle tone. Coordination normal.  Skin: Skin is warm and dry. No rash noted. No erythema. No pallor.  Lentigines diffusely  Psychiatric: He has a normal mood and affect.          Assessment & Plan:   Problem List Items Addressed This Visit      Cardiovascular and Mediastinum   Essential hypertension    bp in fair control at this time  BP Readings from Last 1 Encounters:  09/12/16 130/70   No changes needed Disc lifstyle change with low sodium diet and exercise   Labs reviewed      Relevant Medications   losartan (COZAAR) 100 MG tablet   Other Relevant Orders   Comprehensive metabolic panel     Digestive   Steatohepatitis, non-alcoholic    Liver fxn tests are fairly stable as is exam  Disc imp of wt loss and low fat diet and exercise for this       Relevant Orders   Comprehensive metabolic panel     Other   Hyperglycemia    Lab Results  Component Value Date   HGBA1C 6.2 09/05/2016   Fairly stable pre diabetes  Disc imp of low glycemic diet/exercise/wt loss to prevent DM F/u 6 mo       Relevant Orders   Hemoglobin A1c   Hyperlipidemia    Disc goals for lipids and reasons to control them Rev labs with pt Rev low sat fat diet in detail  Enc exercise to inc HDL        Relevant Medications   losartan (  COZAAR) 100 MG tablet   Other Relevant Orders   Lipid panel   Obesity    Discussed how this problem influences overall health and the risks it imposes  Reviewed plan for weight loss with lower calorie diet (via better food choices and also portion control or program like weight watchers) and exercise building up to or more than 30 minutes 5 days per week including some aerobic activity          Prostate cancer screening    Lab Results  Component Value Date   PSA 0.68 05/28/2015   PSA 0.64 11/12/2009   will add psa to next labs since pt has stopped going to urology and father had prostate cancer (adv age) 72 DRE today      Relevant Orders   PSA   Routine general medical examination at a health care facility    Reviewed health habits including diet and exercise and skin cancer prevention Reviewed appropriate screening tests for age  Also reviewed health mt list, fam hx and immunization status , as well as social and family history   Labs reviewed  Wt loss enc  Declines screen for hep C/HIV due to low risk  Flu shot today  DRE nl  Will add psa to next labs        Other Visit Diagnoses    Need for influenza vaccination    -  Primary   Relevant Orders   Flu Vaccine QUAD 36+ mos IM (Completed)

## 2016-09-12 NOTE — Progress Notes (Signed)
Pre visit review using our clinic review tool, if applicable. No additional management support is needed unless otherwise documented below in the visit note. 

## 2016-09-12 NOTE — Patient Instructions (Addendum)
Weight loss is our biggest goal for control of A1C / prevention of diabetes and care of liver  Goal for exercise - 5 days per week 30 or more minutes   Follow up in 6 months with labs prior

## 2016-09-14 NOTE — Assessment & Plan Note (Signed)
bp in fair control at this time  BP Readings from Last 1 Encounters:  09/12/16 130/70   No changes needed Disc lifstyle change with low sodium diet and exercise   Labs reviewed

## 2016-09-14 NOTE — Assessment & Plan Note (Signed)
Liver fxn tests are fairly stable as is exam  Disc imp of wt loss and low fat diet and exercise for this

## 2016-09-14 NOTE — Assessment & Plan Note (Signed)
Lab Results  Component Value Date   HGBA1C 6.2 09/05/2016   Fairly stable pre diabetes  Disc imp of low glycemic diet/exercise/wt loss to prevent DM F/u 6 mo

## 2016-09-14 NOTE — Assessment & Plan Note (Signed)
Lab Results  Component Value Date   PSA 0.68 05/28/2015   PSA 0.64 11/12/2009   will add psa to next labs since pt has stopped going to urology and father had prostate cancer (adv age) 99 DRE today

## 2016-09-14 NOTE — Assessment & Plan Note (Signed)
Reviewed health habits including diet and exercise and skin cancer prevention Reviewed appropriate screening tests for age  Also reviewed health mt list, fam hx and immunization status , as well as social and family history   Labs reviewed  Wt loss enc  Declines screen for hep C/HIV due to low risk  Flu shot today  DRE nl  Will add psa to next labs

## 2016-09-14 NOTE — Assessment & Plan Note (Signed)
Discussed how this problem influences overall health and the risks it imposes  Reviewed plan for weight loss with lower calorie diet (via better food choices and also portion control or program like weight watchers) and exercise building up to or more than 30 minutes 5 days per week including some aerobic activity    

## 2016-09-14 NOTE — Assessment & Plan Note (Signed)
Disc goals for lipids and reasons to control them Rev labs with pt Rev low sat fat diet in detail  Enc exercise to inc HDL

## 2016-09-25 DIAGNOSIS — J301 Allergic rhinitis due to pollen: Secondary | ICD-10-CM | POA: Diagnosis not present

## 2016-10-09 DIAGNOSIS — J301 Allergic rhinitis due to pollen: Secondary | ICD-10-CM | POA: Diagnosis not present

## 2016-10-23 DIAGNOSIS — J301 Allergic rhinitis due to pollen: Secondary | ICD-10-CM | POA: Diagnosis not present

## 2016-11-06 DIAGNOSIS — J301 Allergic rhinitis due to pollen: Secondary | ICD-10-CM | POA: Diagnosis not present

## 2016-11-15 DIAGNOSIS — J301 Allergic rhinitis due to pollen: Secondary | ICD-10-CM | POA: Diagnosis not present

## 2016-11-20 DIAGNOSIS — J301 Allergic rhinitis due to pollen: Secondary | ICD-10-CM | POA: Diagnosis not present

## 2016-11-29 DIAGNOSIS — J301 Allergic rhinitis due to pollen: Secondary | ICD-10-CM | POA: Diagnosis not present

## 2016-12-04 DIAGNOSIS — J301 Allergic rhinitis due to pollen: Secondary | ICD-10-CM | POA: Diagnosis not present

## 2016-12-08 DIAGNOSIS — J301 Allergic rhinitis due to pollen: Secondary | ICD-10-CM | POA: Diagnosis not present

## 2016-12-27 DIAGNOSIS — J301 Allergic rhinitis due to pollen: Secondary | ICD-10-CM | POA: Diagnosis not present

## 2017-01-01 DIAGNOSIS — J301 Allergic rhinitis due to pollen: Secondary | ICD-10-CM | POA: Diagnosis not present

## 2017-01-04 ENCOUNTER — Other Ambulatory Visit: Payer: Self-pay | Admitting: Family Medicine

## 2017-01-05 NOTE — Telephone Encounter (Signed)
done

## 2017-01-05 NOTE — Telephone Encounter (Signed)
Please refill times 3

## 2017-01-05 NOTE — Telephone Encounter (Signed)
F/u scheduled 03/12/17, last filled was 12/09/14 #10 tabs with 5 additional refills, since it's been over 2 yrs since last filled will rout to PCP for approval

## 2017-01-10 DIAGNOSIS — J301 Allergic rhinitis due to pollen: Secondary | ICD-10-CM | POA: Diagnosis not present

## 2017-01-22 DIAGNOSIS — J301 Allergic rhinitis due to pollen: Secondary | ICD-10-CM | POA: Diagnosis not present

## 2017-01-29 DIAGNOSIS — J301 Allergic rhinitis due to pollen: Secondary | ICD-10-CM | POA: Diagnosis not present

## 2017-02-05 DIAGNOSIS — J301 Allergic rhinitis due to pollen: Secondary | ICD-10-CM | POA: Diagnosis not present

## 2017-02-12 DIAGNOSIS — J301 Allergic rhinitis due to pollen: Secondary | ICD-10-CM | POA: Diagnosis not present

## 2017-02-21 DIAGNOSIS — J301 Allergic rhinitis due to pollen: Secondary | ICD-10-CM | POA: Diagnosis not present

## 2017-02-26 DIAGNOSIS — J301 Allergic rhinitis due to pollen: Secondary | ICD-10-CM | POA: Diagnosis not present

## 2017-02-28 DIAGNOSIS — Z1283 Encounter for screening for malignant neoplasm of skin: Secondary | ICD-10-CM | POA: Diagnosis not present

## 2017-02-28 DIAGNOSIS — D485 Neoplasm of uncertain behavior of skin: Secondary | ICD-10-CM | POA: Diagnosis not present

## 2017-02-28 DIAGNOSIS — L700 Acne vulgaris: Secondary | ICD-10-CM | POA: Diagnosis not present

## 2017-03-02 DIAGNOSIS — J301 Allergic rhinitis due to pollen: Secondary | ICD-10-CM | POA: Diagnosis not present

## 2017-03-05 ENCOUNTER — Other Ambulatory Visit (INDEPENDENT_AMBULATORY_CARE_PROVIDER_SITE_OTHER): Payer: 59

## 2017-03-05 ENCOUNTER — Encounter (INDEPENDENT_AMBULATORY_CARE_PROVIDER_SITE_OTHER): Payer: Self-pay

## 2017-03-05 DIAGNOSIS — R739 Hyperglycemia, unspecified: Secondary | ICD-10-CM

## 2017-03-05 DIAGNOSIS — K7581 Nonalcoholic steatohepatitis (NASH): Secondary | ICD-10-CM | POA: Diagnosis not present

## 2017-03-05 DIAGNOSIS — I10 Essential (primary) hypertension: Secondary | ICD-10-CM

## 2017-03-05 DIAGNOSIS — Z125 Encounter for screening for malignant neoplasm of prostate: Secondary | ICD-10-CM

## 2017-03-05 DIAGNOSIS — E7800 Pure hypercholesterolemia, unspecified: Secondary | ICD-10-CM

## 2017-03-05 LAB — COMPREHENSIVE METABOLIC PANEL
ALBUMIN: 4.2 g/dL (ref 3.5–5.2)
ALT: 55 U/L — AB (ref 0–53)
AST: 33 U/L (ref 0–37)
Alkaline Phosphatase: 73 U/L (ref 39–117)
BILIRUBIN TOTAL: 1 mg/dL (ref 0.2–1.2)
BUN: 18 mg/dL (ref 6–23)
CALCIUM: 9.2 mg/dL (ref 8.4–10.5)
CHLORIDE: 104 meq/L (ref 96–112)
CO2: 24 mEq/L (ref 19–32)
CREATININE: 1.14 mg/dL (ref 0.40–1.50)
GFR: 70.86 mL/min (ref >60.00)
Glucose, Bld: 106 mg/dL — ABNORMAL HIGH (ref 70–99)
Potassium: 3.9 mEq/L (ref 3.5–5.1)
SODIUM: 138 meq/L (ref 135–145)
Total Protein: 7 g/dL (ref 6.0–8.3)

## 2017-03-05 LAB — LIPID PANEL
Cholesterol: 137 mg/dL (ref 0–200)
HDL: 33.6 mg/dL — ABNORMAL LOW (ref >39.00)
LDL CALC: 81 mg/dL (ref 0–99)
NonHDL: 103.72
TRIGLYCERIDES: 112 mg/dL (ref 0.0–149.0)
Total CHOL/HDL Ratio: 4
VLDL: 22.4 mg/dL (ref 0.0–40.0)

## 2017-03-05 LAB — HEMOGLOBIN A1C: HEMOGLOBIN A1C: 6.6 % — AB (ref 4.6–6.5)

## 2017-03-05 LAB — PSA: PSA: 0.78 ng/mL (ref 0.10–4.00)

## 2017-03-07 DIAGNOSIS — J301 Allergic rhinitis due to pollen: Secondary | ICD-10-CM | POA: Diagnosis not present

## 2017-03-12 ENCOUNTER — Encounter: Payer: Self-pay | Admitting: Family Medicine

## 2017-03-12 ENCOUNTER — Ambulatory Visit (INDEPENDENT_AMBULATORY_CARE_PROVIDER_SITE_OTHER): Payer: 59 | Admitting: Family Medicine

## 2017-03-12 VITALS — BP 118/74 | HR 55 | Temp 97.9°F | Ht 69.25 in | Wt 228.5 lb

## 2017-03-12 DIAGNOSIS — Z6832 Body mass index (BMI) 32.0-32.9, adult: Secondary | ICD-10-CM

## 2017-03-12 DIAGNOSIS — E7800 Pure hypercholesterolemia, unspecified: Secondary | ICD-10-CM

## 2017-03-12 DIAGNOSIS — I10 Essential (primary) hypertension: Secondary | ICD-10-CM | POA: Diagnosis not present

## 2017-03-12 DIAGNOSIS — R740 Nonspecific elevation of levels of transaminase and lactic acid dehydrogenase [LDH]: Secondary | ICD-10-CM

## 2017-03-12 DIAGNOSIS — R7401 Elevation of levels of liver transaminase levels: Secondary | ICD-10-CM

## 2017-03-12 DIAGNOSIS — J301 Allergic rhinitis due to pollen: Secondary | ICD-10-CM | POA: Diagnosis not present

## 2017-03-12 DIAGNOSIS — E6609 Other obesity due to excess calories: Secondary | ICD-10-CM

## 2017-03-12 DIAGNOSIS — R739 Hyperglycemia, unspecified: Secondary | ICD-10-CM

## 2017-03-12 DIAGNOSIS — Z125 Encounter for screening for malignant neoplasm of prostate: Secondary | ICD-10-CM

## 2017-03-12 NOTE — Assessment & Plan Note (Signed)
Lab Results  Component Value Date   HGBA1C 6.6 (H) 03/05/2017   This is up -early DM range  Pt is motivated for change now  Has done DM teaching  Will change carb intake  Exercise daily  Work on weight loss  F/u 3 mo with lab prior

## 2017-03-12 NOTE — Patient Instructions (Addendum)
Your sugar is in the diabetes range now (A1C of 6.6)   I want you to work on weight loss - with healthy diet and exercise   Try to get most of your carbohydrates from produce (with the exception of white potatoes)  Eat less bread/pasta/rice/snack foods/cereals/sweets and other items from the middle of the grocery store (processed carbs)   Start exercising when you get home from work (gym membership would help)   Follow up in 3 months with labs prior

## 2017-03-12 NOTE — Progress Notes (Signed)
Subjective:    Patient ID: Terry Dixon, male    DOB: 1960-12-13, 56 y.o.   MRN: [id number]  HPI  Here for f/u of chronic medical problems   Feeling good   Wt Readings from Last 3 Encounters:  03/12/17 228 lb 8 oz (103.6 kg)  09/12/16 225 lb (102.1 kg)  03/10/16 219 lb 12 oz (99.7 kg)  up 3 lb  Has not been watching diet or exercising -no effort  33.50 kg/m  bp is stable today  No cp or palpitations or headaches or edema  No side effects to medicines  BP Readings from Last 3 Encounters:  03/12/17 118/74  09/12/16 130/70  03/10/16 122/68    On losartan   Lab Results  Component Value Date   CREATININE 1.14 03/05/2017   BUN 18 03/05/2017   NA 138 03/05/2017   K 3.9 03/05/2017   CL 104 03/05/2017   CO2 24 03/05/2017   Lab Results  Component Value Date   ALT 55 (H) 03/05/2017   AST 33 03/05/2017   ALKPHOS 73 03/05/2017   BILITOT 1.0 03/05/2017  ALT is down from 60 last check)    Hyperglycemia Lab Results  Component Value Date   HGBA1C 6.6 (H) 03/05/2017  up from 6.2    Hyperlipidemia  Lab Results  Component Value Date   CHOL 137 03/05/2017   CHOL 154 09/05/2016   CHOL 151 03/06/2016   Lab Results  Component Value Date   HDL 33.60 (L) 03/05/2017   HDL 36.80 (L) 09/05/2016   HDL 38.40 (L) 03/06/2016   Lab Results  Component Value Date   LDLCALC 81 03/05/2017   LDLCALC 84 09/05/2016   LDLCALC 85 03/06/2016   Lab Results  Component Value Date   TRIG 112.0 03/05/2017   TRIG 164.0 (H) 09/05/2016   TRIG 139.0 03/06/2016   Lab Results  Component Value Date   CHOLHDL 4 03/05/2017   CHOLHDL 4 09/05/2016   CHOLHDL 4 03/06/2016   Lab Results  Component Value Date   LDLDIRECT 115.6 11/17/2010   LDLDIRECT 121.8 07/17/2007   LDLDIRECT 100.5 01/03/2007   Overall stable   Mother / gm /aunts had DM  He is a big sweet eater  No soft drinks / drinks water and coffee and occ tea  He is a big carb eater   He went to diabetes class last  year-knows how to eat  He does not weigh himself  His work schedule  - he works nights - could come home and exercise before bed   Patient Active Problem List   Diagnosis Date Noted  . Hyperglycemia 03/11/2016  . Prostate cancer screening 12/09/2014  . Steatohepatitis, non-alcoholic 69/45/0388  . Colon cancer screening 05/31/2012  . Low serum testosterone level 11/28/2011  . Routine general medical examination at a health care facility 11/23/2011  . Family history of non-anemic vitamin B12 deficiency 05/22/2011  . TRANSAMINASES, SERUM, ELEVATED 05/23/2010  . ERECTILE DYSFUNCTION 05/26/2008  . Obesity 01/03/2007  . Hyperlipidemia 01/01/2007  . Essential hypertension 01/01/2007  . DIVERTICULOSIS, COLON 01/01/2007   Past Medical History:  Diagnosis Date  . Asthma   . Diverticulitis    colon   . ED (erectile dysfunction)   . Hyperlipidemia   . Hypertension   . Low testosterone   . Obesity    Past Surgical History:  Procedure Laterality Date  . [place]   Social History  Substance Use Topics  . Smoking status: Former Research scientist (life sciences)  . Smokeless tobacco: Never Used     Comment: age 69 for 1 year   . Alcohol use No     Comment: rare   Family History  Problem Relation Age of Onset  . Diabetes Mother   . COPD Mother   . Benign prostatic hyperplasia Father   . Hypertension Father    Allergies  Allergen Reactions  . Dairy Aid [Lactase]   . Viagra [Sildenafil Citrate]     Leg pain   Current Outpatient Prescriptions on File Prior to Visit  Medication Sig Dispense Refill  . albuterol (PROVENTIL HFA;VENTOLIN HFA) 108 (90 BASE) MCG/ACT inhaler Inhale 2 puffs into the lungs every 6 (six) hours as needed for wheezing or shortness of breath. 1 Inhaler 0  . benzonatate (TESSALON) 200 MG capsule Take 200 mg by mouth 3 (three) times daily as needed for cough. Reported on 07/06/2015    . cetirizine (ZYRTEC) 10 MG tablet Take 10 mg by mouth daily as  needed for allergies.    . CIALIS 5 MG tablet TAKE 1 TABLET (5 MG TOTAL) BY MOUTH DAILY AS NEEDED FOR ERECTILE DYSFUNCTION. 10 tablet 3  . DiphenhydrAMINE HCl, Sleep, (ZZZQUIL) 25 MG CAPS Take by mouth at bedtime as needed. Reported on 07/06/2015    . DULERA 100-5 MCG/ACT AERO   10  . EPIPEN 2-PAK 0.3 MG/0.3ML SOAJ injection See admin instructions. Reported on 07/06/2015-use as needed  11  . fluticasone (FLONASE) 50 MCG/ACT nasal spray Place 2 sprays into both nostrils daily.   19  . glucose blood (ONE TOUCH ULTRA TEST) test strip Check blood sugar twice a day and as directed. Dx E11.9 100 each 3  . guaiFENesin (MUCINEX) 600 MG 12 hr tablet Take 1 tablet (600 mg total) by mouth 2 (two) times daily. 60 tablet 0  . losartan (COZAAR) 100 MG tablet TAKE 1 TABLET (100 MG TOTAL) BY MOUTH DAILY. 90 tablet 3  . MELATONIN MAXIMUM STRENGTH PO Take 10 mg by mouth at bedtime as needed.    . Multiple Vitamin (DAILY MULTIVITAMIN PO) Take 1 tablet by mouth daily.     . Omega-3 Fatty Acids (FISH OIL) 1200 MG CAPS Take by mouth daily.      Glory Rosebush DELICA LANCETS 38B MISC Check blood sugar twice a day and as directed. Dx E11.9 100 each 3  . pseudoephedrine (SUDAFED) 120 MG 12 hr tablet Take 120 mg by mouth 2 (two) times daily as needed. Reported on 07/06/2015     No current facility-administered medications on file prior to visit.     Review of Systems    Review of Systems  Constitutional: Negative for fever, appetite change, fatigue and unexpected weight change.  Eyes: Negative for pain and visual disturbance.  Respiratory: Negative for cough and shortness of breath.   Cardiovascular: Negative for cp or palpitations    Gastrointestinal: Negative for nausea, diarrhea and constipation.  Genitourinary: Negative for urgency and frequency.  Skin: Negative for pallor or rash   Neurological: Negative for weakness, light-headedness, numbness and headaches.  Hematological: Negative for adenopathy. Does not  bruise/bleed easily.  Psychiatric/Behavioral: Negative for dysphoric mood. The patient is not nervous/anxious.      Objective:   Physical Exam  Constitutional: He appears well-developed and well-nourished. No distress.  obese and well appearing   HENT:  Head: Normocephalic and atraumatic.  Mouth/Throat: Oropharynx is clear and moist.  Eyes: Pupils are equal, round, and reactive  to light. Conjunctivae and EOM are normal.  Neck: Normal range of motion. Neck supple. No JVD present. Carotid bruit is not present. No thyromegaly present.  Cardiovascular: Normal rate, regular rhythm, normal heart sounds and intact distal pulses.  Exam reveals no gallop.   Pulmonary/Chest: Effort normal and breath sounds normal. No respiratory distress. He has no wheezes. He has no rales.  No crackles  Abdominal: Soft. Bowel sounds are normal. He exhibits no distension, no abdominal bruit and no mass. There is no tenderness.  Musculoskeletal: He exhibits no edema.  Lymphadenopathy:    He has no cervical adenopathy.  Neurological: He is alert. He has normal reflexes.  Skin: Skin is warm and dry. No rash noted.  Psychiatric: He has a normal mood and affect.          Assessment & Plan:   Problem List Items Addressed This Visit      Cardiovascular and Mediastinum   Essential hypertension - Primary    bp in fair control at this time  BP Readings from Last 1 Encounters:  03/12/17 118/74   No changes needed Disc lifstyle change with low sodium diet and exercise  Labs reviewed         Other   Hyperglycemia    Lab Results  Component Value Date   HGBA1C 6.6 (H) 03/05/2017   This is up -early DM range  Pt is motivated for change now  Has done DM teaching  Will change carb intake  Exercise daily  Work on weight loss  F/u 3 mo with lab prior       Hyperlipidemia    Stable lipids  HDL low -enc exercise  Disc goals for lipids and reasons to control them Rev labs with pt Rev low sat fat diet  in detail       Obesity    Discussed how this problem influences overall health and the risks it imposes  Reviewed plan for weight loss with lower calorie diet (via better food choices and also portion control or program like weight watchers) and exercise building up to or more than 30 minutes 5 days per week including some aerobic activity         Prostate cancer screening    Lab Results  Component Value Date   PSA 0.78 03/05/2017   PSA 0.68 05/28/2015   PSA 0.64 11/12/2009   Reassuring       TRANSAMINASES, SERUM, ELEVATED    ALT is 55  Disc need for wt loss for fatty liver

## 2017-03-12 NOTE — Assessment & Plan Note (Signed)
Discussed how this problem influences overall health and the risks it imposes  Reviewed plan for weight loss with lower calorie diet (via better food choices and also portion control or program like weight watchers) and exercise building up to or more than 30 minutes 5 days per week including some aerobic activity    

## 2017-03-12 NOTE — Assessment & Plan Note (Signed)
bp in fair control at this time  BP Readings from Last 1 Encounters:  03/12/17 118/74   No changes needed Disc lifstyle change with low sodium diet and exercise  Labs reviewed

## 2017-03-12 NOTE — Assessment & Plan Note (Signed)
Lab Results  Component Value Date   PSA 0.78 03/05/2017   PSA 0.68 05/28/2015   PSA 0.64 11/12/2009   Reassuring

## 2017-03-12 NOTE — Assessment & Plan Note (Signed)
Stable lipids  HDL low -enc exercise  Disc goals for lipids and reasons to control them Rev labs with pt Rev low sat fat diet in detail

## 2017-03-12 NOTE — Assessment & Plan Note (Signed)
ALT is 55  Disc need for wt loss for fatty liver

## 2017-03-19 ENCOUNTER — Other Ambulatory Visit: Payer: Self-pay | Admitting: Family Medicine

## 2017-03-21 DIAGNOSIS — J301 Allergic rhinitis due to pollen: Secondary | ICD-10-CM | POA: Diagnosis not present

## 2017-03-26 DIAGNOSIS — J301 Allergic rhinitis due to pollen: Secondary | ICD-10-CM | POA: Diagnosis not present

## 2017-04-02 DIAGNOSIS — J301 Allergic rhinitis due to pollen: Secondary | ICD-10-CM | POA: Diagnosis not present

## 2017-04-09 DIAGNOSIS — J301 Allergic rhinitis due to pollen: Secondary | ICD-10-CM | POA: Diagnosis not present

## 2017-04-16 DIAGNOSIS — J301 Allergic rhinitis due to pollen: Secondary | ICD-10-CM | POA: Diagnosis not present

## 2017-04-23 DIAGNOSIS — J301 Allergic rhinitis due to pollen: Secondary | ICD-10-CM | POA: Diagnosis not present

## 2017-05-02 DIAGNOSIS — J301 Allergic rhinitis due to pollen: Secondary | ICD-10-CM | POA: Diagnosis not present

## 2017-05-07 DIAGNOSIS — J301 Allergic rhinitis due to pollen: Secondary | ICD-10-CM | POA: Diagnosis not present

## 2017-05-16 DIAGNOSIS — J301 Allergic rhinitis due to pollen: Secondary | ICD-10-CM | POA: Diagnosis not present

## 2017-05-21 DIAGNOSIS — J301 Allergic rhinitis due to pollen: Secondary | ICD-10-CM | POA: Diagnosis not present

## 2017-05-29 DIAGNOSIS — J301 Allergic rhinitis due to pollen: Secondary | ICD-10-CM | POA: Diagnosis not present

## 2017-05-30 DIAGNOSIS — J301 Allergic rhinitis due to pollen: Secondary | ICD-10-CM | POA: Diagnosis not present

## 2017-06-04 DIAGNOSIS — J301 Allergic rhinitis due to pollen: Secondary | ICD-10-CM | POA: Diagnosis not present

## 2017-06-11 ENCOUNTER — Other Ambulatory Visit (INDEPENDENT_AMBULATORY_CARE_PROVIDER_SITE_OTHER): Payer: 59

## 2017-06-11 DIAGNOSIS — I10 Essential (primary) hypertension: Secondary | ICD-10-CM | POA: Diagnosis not present

## 2017-06-11 DIAGNOSIS — J301 Allergic rhinitis due to pollen: Secondary | ICD-10-CM | POA: Diagnosis not present

## 2017-06-11 DIAGNOSIS — R739 Hyperglycemia, unspecified: Secondary | ICD-10-CM | POA: Diagnosis not present

## 2017-06-11 LAB — COMPREHENSIVE METABOLIC PANEL
ALK PHOS: 71 U/L (ref 39–117)
ALT: 41 U/L (ref 0–53)
AST: 26 U/L (ref 0–37)
Albumin: 4.4 g/dL (ref 3.5–5.2)
BILIRUBIN TOTAL: 0.9 mg/dL (ref 0.2–1.2)
BUN: 15 mg/dL (ref 6–23)
CO2: 26 mEq/L (ref 19–32)
CREATININE: 0.95 mg/dL (ref 0.40–1.50)
Calcium: 9.6 mg/dL (ref 8.4–10.5)
Chloride: 105 mEq/L (ref 96–112)
GFR: 87.36 mL/min (ref >60.00)
GLUCOSE: 92 mg/dL (ref 70–99)
POTASSIUM: 4 meq/L (ref 3.5–5.1)
SODIUM: 139 meq/L (ref 135–145)
TOTAL PROTEIN: 7.1 g/dL (ref 6.0–8.3)

## 2017-06-11 LAB — HEMOGLOBIN A1C: Hgb A1c MFr Bld: 5.9 % (ref 4.6–6.5)

## 2017-06-18 ENCOUNTER — Ambulatory Visit: Payer: 59 | Admitting: Family Medicine

## 2017-06-18 ENCOUNTER — Encounter: Payer: Self-pay | Admitting: Family Medicine

## 2017-06-18 VITALS — BP 132/80 | HR 55 | Temp 97.6°F | Ht 69.25 in | Wt 219.0 lb

## 2017-06-18 DIAGNOSIS — I10 Essential (primary) hypertension: Secondary | ICD-10-CM

## 2017-06-18 DIAGNOSIS — Z6832 Body mass index (BMI) 32.0-32.9, adult: Secondary | ICD-10-CM

## 2017-06-18 DIAGNOSIS — R739 Hyperglycemia, unspecified: Secondary | ICD-10-CM | POA: Diagnosis not present

## 2017-06-18 DIAGNOSIS — K7581 Nonalcoholic steatohepatitis (NASH): Secondary | ICD-10-CM | POA: Diagnosis not present

## 2017-06-18 DIAGNOSIS — Z23 Encounter for immunization: Secondary | ICD-10-CM | POA: Diagnosis not present

## 2017-06-18 DIAGNOSIS — E6609 Other obesity due to excess calories: Secondary | ICD-10-CM | POA: Diagnosis not present

## 2017-06-18 NOTE — Patient Instructions (Addendum)
Keep up the good work with diet and weight loss Try to add some exercise -work up to 30 minutes or more 5 days per week  Labs look better !  Try to get most of your carbohydrates from produce (with the exception of white potatoes)  Eat less bread/pasta/rice/snack foods/cereals/sweets and other items from the middle of the grocery store (processed carbs)    Let's set up your physical for the spring

## 2017-06-18 NOTE — Progress Notes (Signed)
Subjective:    Patient ID: Terry Dixon, male    DOB: 1961/04/10, 56 y.o.   MRN: [id number]  HPI Here for f/u of chronic health problems   Wt Readings from Last 3 Encounters:  06/18/17 219 lb (99.3 kg)  03/12/17 228 lb 8 oz (103.6 kg)  09/12/16 225 lb (102.1 kg)  has lost 10 lb  Eating less at work/ and really watching what he eats  Not exercising  Cut out bread 32.11 kg/m  Getting his flu shot today   bp is up on first check- just got off work with 5 cups of coffee  No cp or palpitations or headaches or edema  No side effects to medicines  BP Readings from Last 3 Encounters:  06/18/17 (!) 144/88  03/12/17 118/74  09/12/16 130/70  takes losartan  Re check 132/80   Lab Results  Component Value Date   CREATININE 0.95 06/11/2017   BUN 15 06/11/2017   NA 139 06/11/2017   K 4.0 06/11/2017   CL 105 06/11/2017   CO2 26 06/11/2017   Lab Results  Component Value Date   ALT 41 06/11/2017   AST 26 06/11/2017   ALKPHOS 71 06/11/2017   BILITOT 0.9 06/11/2017    Had prev elevated transaminase from suspected fatty liver     Hyperglycemia Lab Results  Component Value Date   HGBA1C 5.9 06/11/2017   This is improved! Was formerly 6.6  He checks some glucose levels and learning what makes it worse (bread and pasta)  More veg and protein   Patient Active Problem List   Diagnosis Date Noted  . Hyperglycemia 03/11/2016  . Prostate cancer screening 12/09/2014  . Steatohepatitis, non-alcoholic 35/70/1779  . Colon cancer screening 05/31/2012  . Low serum testosterone level 11/28/2011  . Routine general medical examination at a health care facility 11/23/2011  . Family history of non-anemic vitamin B12 deficiency 05/22/2011  . ERECTILE DYSFUNCTION 05/26/2008  . Obesity 01/03/2007  . Hyperlipidemia 01/01/2007  . Essential hypertension 01/01/2007  . DIVERTICULOSIS, COLON 01/01/2007   Past Medical History:  Diagnosis Date  . Asthma   . Diverticulitis    colon     . ED (erectile dysfunction)   . Hyperlipidemia   . Hypertension   . Low testosterone   . Obesity    Past Surgical History:  Procedure Laterality Date  . [place]   Social History   Tobacco Use  . Smoking status: Former Research scientist (life sciences)  . Smokeless tobacco: Never Used  . Tobacco comment: age 66 for 1 year   Substance Use Topics  . Alcohol use: No    Alcohol/week: 0.0 oz    Comment: rare  . Drug use: No   Family History  Problem Relation Age of Onset  . Diabetes Mother   . COPD Mother   . Benign prostatic hyperplasia Father   . Hypertension Father    Allergies  Allergen Reactions  . Dairy Aid [Lactase]   . Viagra [Sildenafil Citrate]     Leg pain   Current Outpatient Medications on File Prior to Visit  Medication Sig Dispense Refill  . albuterol (PROVENTIL HFA;VENTOLIN HFA) 108 (90 BASE) MCG/ACT inhaler Inhale 2 puffs into the lungs every 6 (six) hours as needed for wheezing or shortness of breath. 1 Inhaler 0  . cetirizine (ZYRTEC) 10 MG tablet Take 10 mg by mouth daily as needed for allergies.    Tye [place]  5 MG tablet TAKE 1 TABLET (5 MG TOTAL) BY MOUTH DAILY AS NEEDED FOR ERECTILE DYSFUNCTION. 10 tablet 3  . DiphenhydrAMINE HCl, Sleep, (ZZZQUIL) 25 MG CAPS Take by mouth at bedtime as needed. Reported on 07/06/2015    . DULERA 100-5 MCG/ACT AERO   10  . EPIPEN 2-PAK 0.3 MG/0.3ML SOAJ injection See admin instructions. Reported on 07/06/2015-use as needed  11  . fluticasone (FLONASE) 50 MCG/ACT nasal spray Place 2 sprays into both nostrils daily.   19  . guaiFENesin (MUCINEX) 600 MG 12 hr tablet Take 1 tablet (600 mg total) by mouth 2 (two) times daily. 60 tablet 0  . losartan (COZAAR) 100 MG tablet TAKE 1 TABLET (100 MG TOTAL) BY MOUTH DAILY. 90 tablet 3  . MELATONIN MAXIMUM STRENGTH PO Take 10 mg by mouth at bedtime as needed.    . Multiple Vitamin (DAILY MULTIVITAMIN PO) Take 1 tablet by mouth daily.     . Omega-3 Fatty Acids (FISH OIL)  1200 MG CAPS Take by mouth daily.      . ONE TOUCH ULTRA TEST test strip CHECK BLOOD SUGAR TWICE A DAY AND AS DIRECTED. DX E11.9 100 each 3  . ONETOUCH DELICA LANCETS 47M MISC Check blood sugar twice a day and as directed. Dx E11.9 100 each 3  . pseudoephedrine (SUDAFED) 120 MG 12 hr tablet Take 120 mg by mouth 2 (two) times daily as needed. Reported on 07/06/2015     No current facility-administered medications on file prior to visit.      Review of Systems  Constitutional: Negative for activity change, appetite change, fatigue, fever and unexpected weight change.  HENT: Negative for congestion, rhinorrhea, sore throat and trouble swallowing.   Eyes: Negative for pain, redness, itching and visual disturbance.  Respiratory: Negative for cough, chest tightness, shortness of breath and wheezing.   Cardiovascular: Negative for chest pain and palpitations.  Gastrointestinal: Negative for abdominal pain, blood in stool, constipation, diarrhea and nausea.  Endocrine: Negative for cold intolerance, heat intolerance, polydipsia and polyuria.  Genitourinary: Negative for difficulty urinating, dysuria, frequency and urgency.  Musculoskeletal: Negative for arthralgias, joint swelling and myalgias.  Skin: Negative for pallor and rash.  Neurological: Negative for dizziness, tremors, weakness, numbness and headaches.  Hematological: Negative for adenopathy. Does not bruise/bleed easily.  Psychiatric/Behavioral: Negative for decreased concentration and dysphoric mood. The patient is not nervous/anxious.        Objective:   Physical Exam  Constitutional: He appears well-developed and well-nourished. No distress.  obese and well appearing   HENT:  Head: Normocephalic and atraumatic.  Mouth/Throat: Oropharynx is clear and moist.  Eyes: Conjunctivae and EOM are normal. Pupils are equal, round, and reactive to light.  Neck: Normal range of motion. Neck supple. No JVD present. Carotid bruit is not  present. No thyromegaly present.  Cardiovascular: Normal rate, regular rhythm, normal heart sounds and intact distal pulses. Exam reveals no gallop.  Pulmonary/Chest: Effort normal and breath sounds normal. No respiratory distress. He has no wheezes. He has no rales.  No crackles  Abdominal: Soft. Bowel sounds are normal. He exhibits no distension, no abdominal bruit and no mass. There is no tenderness.  No change in umbilical hernia   Musculoskeletal: He exhibits no edema.  Lymphadenopathy:    He has no cervical adenopathy.  Neurological: He is alert. He has normal reflexes.  Skin: Skin is warm and dry. No rash noted. No pallor.  Psychiatric: He has a normal mood and affect.  Assessment & Plan:   Problem List Items Addressed This Visit      Cardiovascular and Mediastinum   Essential hypertension    bp in fair control at this time  BP Readings from Last 1 Encounters:  06/18/17 132/80   No changes needed Disc lifstyle change with low sodium diet and exercise  Stable  Enc further wt loss         Digestive   Steatohepatitis, non-alcoholic    ALT is normalized with 10 lb wt loss  Commended! Will continue to follow        Other   Hyperglycemia    Improved with DM diet and 20 lb wt loss  Lab Results  Component Value Date   HGBA1C 5.9 06/11/2017   disc imp of low glycemic diet and wt loss to prevent DM2  Will plan to add exercise  F/u PE late spring       Obesity    Discussed how this problem influences overall health and the risks it imposes  Reviewed plan for weight loss with lower calorie diet (via better food choices and also portion control or program like weight watchers) and exercise building up to or more than 30 minutes 5 days per week including some aerobic activity   Commended on 10 lb off with better diet He is motivated Enc to add exercise        Other Visit Diagnoses    Need for influenza vaccination    -  Primary   Relevant Orders    Flu Vaccine QUAD 6+ mos PF IM (Fluarix Quad PF) (Completed)

## 2017-06-18 NOTE — Assessment & Plan Note (Signed)
bp in fair control at this time  BP Readings from Last 1 Encounters:  06/18/17 132/80   No changes needed Disc lifstyle change with low sodium diet and exercise  Stable  Enc further wt loss

## 2017-06-18 NOTE — Assessment & Plan Note (Signed)
Discussed how this problem influences overall health and the risks it imposes  Reviewed plan for weight loss with lower calorie diet (via better food choices and also portion control or program like weight watchers) and exercise building up to or more than 30 minutes 5 days per week including some aerobic activity   Commended on 10 lb off with better diet He is motivated Enc to add exercise

## 2017-06-18 NOTE — Assessment & Plan Note (Signed)
Improved with DM diet and 20 lb wt loss  Lab Results  Component Value Date   HGBA1C 5.9 06/11/2017   disc imp of low glycemic diet and wt loss to prevent DM2  Will plan to add exercise  F/u PE late spring

## 2017-06-18 NOTE — Assessment & Plan Note (Signed)
ALT is normalized with 10 lb wt loss  Commended! Will continue to follow

## 2017-06-27 DIAGNOSIS — J301 Allergic rhinitis due to pollen: Secondary | ICD-10-CM | POA: Diagnosis not present

## 2017-07-02 DIAGNOSIS — J301 Allergic rhinitis due to pollen: Secondary | ICD-10-CM | POA: Diagnosis not present

## 2017-07-03 DIAGNOSIS — J309 Allergic rhinitis, unspecified: Secondary | ICD-10-CM | POA: Diagnosis not present

## 2017-07-25 DIAGNOSIS — J301 Allergic rhinitis due to pollen: Secondary | ICD-10-CM | POA: Diagnosis not present

## 2017-07-30 DIAGNOSIS — J301 Allergic rhinitis due to pollen: Secondary | ICD-10-CM | POA: Diagnosis not present

## 2017-08-08 DIAGNOSIS — J301 Allergic rhinitis due to pollen: Secondary | ICD-10-CM | POA: Diagnosis not present

## 2017-08-13 DIAGNOSIS — J301 Allergic rhinitis due to pollen: Secondary | ICD-10-CM | POA: Diagnosis not present

## 2017-08-24 DIAGNOSIS — J301 Allergic rhinitis due to pollen: Secondary | ICD-10-CM | POA: Diagnosis not present

## 2017-08-27 DIAGNOSIS — J301 Allergic rhinitis due to pollen: Secondary | ICD-10-CM | POA: Diagnosis not present

## 2017-09-10 DIAGNOSIS — J301 Allergic rhinitis due to pollen: Secondary | ICD-10-CM | POA: Diagnosis not present

## 2017-09-24 DIAGNOSIS — J301 Allergic rhinitis due to pollen: Secondary | ICD-10-CM | POA: Diagnosis not present

## 2017-10-08 DIAGNOSIS — J301 Allergic rhinitis due to pollen: Secondary | ICD-10-CM | POA: Diagnosis not present

## 2017-10-22 ENCOUNTER — Telehealth: Payer: Self-pay | Admitting: Family Medicine

## 2017-10-22 DIAGNOSIS — J301 Allergic rhinitis due to pollen: Secondary | ICD-10-CM | POA: Diagnosis not present

## 2017-10-22 DIAGNOSIS — E7800 Pure hypercholesterolemia, unspecified: Secondary | ICD-10-CM

## 2017-10-22 DIAGNOSIS — Z125 Encounter for screening for malignant neoplasm of prostate: Secondary | ICD-10-CM

## 2017-10-22 DIAGNOSIS — R739 Hyperglycemia, unspecified: Secondary | ICD-10-CM

## 2017-10-22 DIAGNOSIS — I10 Essential (primary) hypertension: Secondary | ICD-10-CM

## 2017-10-22 NOTE — Telephone Encounter (Signed)
-----   Message from Ellamae Sia sent at 10/22/2017 11:18 AM EDT ----- Regarding: Lab orders for Wednesday, 4.17.19 Patient is scheduled for CPX labs, please order future labs, Thanks , Karna Christmas

## 2017-10-31 ENCOUNTER — Other Ambulatory Visit (INDEPENDENT_AMBULATORY_CARE_PROVIDER_SITE_OTHER): Payer: 59

## 2017-10-31 ENCOUNTER — Other Ambulatory Visit: Payer: 59

## 2017-10-31 DIAGNOSIS — E7800 Pure hypercholesterolemia, unspecified: Secondary | ICD-10-CM

## 2017-10-31 DIAGNOSIS — Z125 Encounter for screening for malignant neoplasm of prostate: Secondary | ICD-10-CM | POA: Diagnosis not present

## 2017-10-31 DIAGNOSIS — R739 Hyperglycemia, unspecified: Secondary | ICD-10-CM | POA: Diagnosis not present

## 2017-10-31 DIAGNOSIS — I10 Essential (primary) hypertension: Secondary | ICD-10-CM

## 2017-10-31 LAB — PSA: PSA: 0.66 ng/mL (ref 0.10–4.00)

## 2017-10-31 LAB — LIPID PANEL
CHOL/HDL RATIO: 4
Cholesterol: 146 mg/dL (ref 0–200)
HDL: 39.7 mg/dL (ref >39.00)
LDL Cholesterol: 75 mg/dL (ref 0–99)
NONHDL: 106.25
Triglycerides: 158 mg/dL — ABNORMAL HIGH (ref 0.0–149.0)
VLDL: 31.6 mg/dL (ref 0.0–40.0)

## 2017-10-31 LAB — CBC WITH DIFFERENTIAL/PLATELET
BASOS ABS: 0 10*3/uL (ref 0.0–0.1)
BASOS PCT: 0.3 % (ref 0.0–3.0)
Eosinophils Absolute: 0.1 10*3/uL (ref 0.0–0.7)
Eosinophils Relative: 1.2 % (ref 0.0–5.0)
HEMATOCRIT: 42.2 % (ref 39.0–52.0)
Hemoglobin: 14.3 g/dL (ref 13.0–17.0)
LYMPHS ABS: 1.9 10*3/uL (ref 0.7–4.0)
LYMPHS PCT: 27.6 % (ref 12.0–46.0)
MCHC: 33.7 g/dL (ref 30.0–36.0)
MCV: 85.9 fl (ref 78.0–100.0)
Monocytes Absolute: 0.5 10*3/uL (ref 0.1–1.0)
Monocytes Relative: 7.7 % (ref 3.0–12.0)
NEUTROS ABS: 4.4 10*3/uL (ref 1.4–7.7)
NEUTROS PCT: 63.2 % (ref 43.0–77.0)
PLATELETS: 203 10*3/uL (ref 150.0–400.0)
RBC: 4.92 Mil/uL (ref 4.22–5.81)
RDW: 14 % (ref 11.5–15.5)
WBC: 7 10*3/uL (ref 4.0–10.5)

## 2017-10-31 LAB — TSH: TSH: 2.83 u[IU]/mL (ref 0.35–4.50)

## 2017-10-31 LAB — COMPREHENSIVE METABOLIC PANEL
ALT: 45 U/L (ref 0–53)
AST: 26 U/L (ref 0–37)
Albumin: 4.4 g/dL (ref 3.5–5.2)
Alkaline Phosphatase: 73 U/L (ref 39–117)
BILIRUBIN TOTAL: 1.2 mg/dL (ref 0.2–1.2)
BUN: 15 mg/dL (ref 6–23)
CHLORIDE: 100 meq/L (ref 96–112)
CO2: 26 meq/L (ref 19–32)
Calcium: 9.1 mg/dL (ref 8.4–10.5)
Creatinine, Ser: 1.03 mg/dL (ref 0.40–1.50)
GFR: 79.47 mL/min (ref >60.00)
GLUCOSE: 93 mg/dL (ref 70–99)
Potassium: 4 mEq/L (ref 3.5–5.1)
Sodium: 136 mEq/L (ref 135–145)
Total Protein: 6.8 g/dL (ref 6.0–8.3)

## 2017-10-31 LAB — HEMOGLOBIN A1C: HEMOGLOBIN A1C: 6.2 % (ref 4.6–6.5)

## 2017-11-05 ENCOUNTER — Ambulatory Visit (INDEPENDENT_AMBULATORY_CARE_PROVIDER_SITE_OTHER): Payer: 59 | Admitting: Family Medicine

## 2017-11-05 ENCOUNTER — Encounter: Payer: Self-pay | Admitting: Family Medicine

## 2017-11-05 VITALS — BP 134/70 | HR 58 | Temp 98.3°F | Ht 69.0 in | Wt 229.8 lb

## 2017-11-05 DIAGNOSIS — Z6832 Body mass index (BMI) 32.0-32.9, adult: Secondary | ICD-10-CM

## 2017-11-05 DIAGNOSIS — J301 Allergic rhinitis due to pollen: Secondary | ICD-10-CM | POA: Diagnosis not present

## 2017-11-05 DIAGNOSIS — K7581 Nonalcoholic steatohepatitis (NASH): Secondary | ICD-10-CM | POA: Diagnosis not present

## 2017-11-05 DIAGNOSIS — I10 Essential (primary) hypertension: Secondary | ICD-10-CM | POA: Diagnosis not present

## 2017-11-05 DIAGNOSIS — E7800 Pure hypercholesterolemia, unspecified: Secondary | ICD-10-CM

## 2017-11-05 DIAGNOSIS — E6609 Other obesity due to excess calories: Secondary | ICD-10-CM

## 2017-11-05 DIAGNOSIS — Z125 Encounter for screening for malignant neoplasm of prostate: Secondary | ICD-10-CM

## 2017-11-05 DIAGNOSIS — R7303 Prediabetes: Secondary | ICD-10-CM

## 2017-11-05 DIAGNOSIS — Z0000 Encounter for general adult medical examination without abnormal findings: Secondary | ICD-10-CM

## 2017-11-05 MED ORDER — GLUCOSE BLOOD VI STRP: ORAL_STRIP | 3 refills | Status: DC

## 2017-11-05 MED ORDER — LOSARTAN POTASSIUM 100 MG PO TABS: ORAL_TABLET | ORAL | 3 refills | Status: DC

## 2017-11-05 NOTE — Assessment & Plan Note (Signed)
Discussed how this problem influences overall health and the risks it imposes  Reviewed plan for weight loss with lower calorie diet (via better food choices and also portion control or program like weight watchers) and exercise building up to or more than 30 minutes 5 days per week including some aerobic activity    

## 2017-11-05 NOTE — Progress Notes (Signed)
Subjective:    Patient ID: Terry Dixon, male    DOB: 1960/08/02, 57 y.o.   MRN: [id number]  HPI Here for health maintenance exam and to review chronic medical problems   Has been doing well  Feels good overall   Working -still on night shift  Was supposed to go to day time- (before end of summer)  Looking forward to that Self care is tough with his job schedule    Wt Readings from Last 3 Encounters:  11/05/17 229 lb 12 oz (104.2 kg)  06/18/17 219 lb (99.3 kg)  03/12/17 228 lb 8 oz (103.6 kg)  wt is up 10 lb  Not eating well/ a lot of traveling with death of sister / and also a birthday party last week  No regular exercise - besides active job  33.93 kg/m   Eye exam 2/19 he thinks    Flu shot 12/18  Tetanus shot 10/10  Colonoscopy 1/14- nl with 10 y recall  Will do ifob test this year   Sister died of breast cancer at 61 years old this year Not common in his family  This was tough on him  He thinks he is doing ok with grief (it does not seem real to him)   Prostate health Father had prostate cancer in advanced age - successfully treated  Urology in the past  No urinary symptoms at all  He drinks a lot of fluids  Does get up during sleep if he drinks a lot  No change in stream and not hard to empty   Lab Results  Component Value Date   PSA 0.66 10/31/2017   PSA 0.78 03/05/2017   PSA 0.68 05/28/2015     bp is stable today  No cp or palpitations or headaches or edema  No side effects to medicines  BP Readings from Last 3 Encounters:  11/05/17 134/70  06/18/17 132/80  03/12/17 118/74     Lab Results  Component Value Date   CREATININE 1.03 10/31/2017   BUN 15 10/31/2017   NA 136 10/31/2017   K 4.0 10/31/2017   CL 100 10/31/2017   CO2 26 10/31/2017    Lab Results  Component Value Date   ALT 45 10/31/2017   AST 26 10/31/2017   ALKPHOS 73 10/31/2017   BILITOT 1.2 10/31/2017   hx of NASH in the past also   Hyperglycemia Lab Results    Component Value Date   HGBA1C 6.2 10/31/2017  last was 6.1 Blood sugar checks have been good  avg about 116     Hyperlipidemia Lab Results  Component Value Date   CHOL 146 10/31/2017   CHOL 137 03/05/2017   CHOL 154 09/05/2016   Lab Results  Component Value Date   HDL 39.70 10/31/2017   HDL 33.60 (L) 03/05/2017   HDL 36.80 (L) 09/05/2016   Lab Results  Component Value Date   LDLCALC 75 10/31/2017   [place] 81 03/05/2017   LDLCALC 84 09/05/2016   Lab Results  Component Value Date   TRIG 158.0 (H) 10/31/2017   TRIG 112.0 03/05/2017   TRIG 164.0 (H) 09/05/2016   Lab Results  Component Value Date   CHOLHDL 4 10/31/2017   CHOLHDL 4 03/05/2017   CHOLHDL 4 09/05/2016   Lab Results  Component Value Date   LDLDIRECT 115.6 11/17/2010   LDLDIRECT 121.8 07/17/2007   LDLDIRECT 100.5 01/03/2007   Overall fair cholesterol   Lab Results  Component Value Date  WBC 7.0 10/31/2017   HGB 14.3 10/31/2017   HCT 42.2 10/31/2017   MCV 85.9 10/31/2017   PLT 203.0 10/31/2017   Lab Results  Component Value Date   TSH 2.83 10/31/2017     Patient Active Problem List   Diagnosis Date Noted  . Hyperglycemia 03/11/2016  . Prostate cancer screening 12/09/2014  . Steatohepatitis, non-alcoholic 17/51/0258  . Colon cancer screening 05/31/2012  . Low serum testosterone level 11/28/2011  . Routine general medical examination at a health care facility 11/23/2011  . Family history of non-anemic vitamin B12 deficiency 05/22/2011  . ERECTILE DYSFUNCTION 05/26/2008  . Obesity 01/03/2007  . Hyperlipidemia 01/01/2007  . Essential hypertension 01/01/2007  . DIVERTICULOSIS, COLON 01/01/2007   Past Medical History:  Diagnosis Date  . Asthma   . Diverticulitis    colon   . ED (erectile dysfunction)   . Hyperlipidemia   . Hypertension   . Low testosterone   . Obesity    Past Surgical History:  Procedure Laterality Date  . [place]    Social History   Tobacco Use  . Smoking status: Former Research scientist (life sciences)  . Smokeless tobacco: Never Used  . Tobacco comment: age 55 for 1 year   Substance Use Topics  . Alcohol use: No    Alcohol/week: 0.0 oz    Comment: rare  . Drug use: No   Family History  Problem Relation Age of Onset  . Diabetes Mother   . COPD Mother   . Benign prostatic hyperplasia Father   . Hypertension Father   . Breast cancer Sister 27       died at 75   Allergies  Allergen Reactions  . Dairy Aid [Lactase]   . Viagra [Sildenafil Citrate]     Leg pain   Current Outpatient Medications on File Prior to Visit  Medication Sig Dispense Refill  . albuterol (PROVENTIL HFA;VENTOLIN HFA) 108 (90 BASE) MCG/ACT inhaler Inhale 2 puffs into the lungs every 6 (six) hours as needed for wheezing or shortness of breath. 1 Inhaler 0  . cetirizine (ZYRTEC) 10 MG tablet Take 10 mg by mouth daily as needed for allergies.    . CIALIS 5 MG tablet TAKE 1 TABLET (5 MG TOTAL) BY MOUTH DAILY AS NEEDED FOR ERECTILE DYSFUNCTION. 10 tablet 3  . DiphenhydrAMINE HCl, Sleep, (ZZZQUIL) 25 MG CAPS Take by mouth at bedtime as needed. Reported on 07/06/2015    . EPIPEN 2-PAK 0.3 MG/0.3ML SOAJ injection See admin instructions. Reported on 07/06/2015-use as needed  11  . fluticasone (FLONASE) 50 MCG/ACT nasal spray Place 2 sprays into both nostrils daily.   19  . guaiFENesin (MUCINEX) 600 MG 12 hr tablet Take 1 tablet (600 mg total) by mouth 2 (two) times daily. 60 tablet 0  . MELATONIN MAXIMUM STRENGTH PO Take 10 mg by mouth at bedtime as needed.    . Multiple Vitamin (DAILY MULTIVITAMIN PO) Take 1 tablet by mouth daily.     . Omega-3 Fatty Acids (FISH OIL) 1200 MG CAPS Take by mouth daily.      Glory Rosebush DELICA LANCETS 52D MISC Check blood sugar twice a day and as directed. Dx E11.9 100 each 3  . pseudoephedrine (SUDAFED) 120 MG 12 hr tablet Take 120 mg by mouth 2 (two) times daily as needed. Reported on 07/06/2015    . SYMBICORT 80-4.5  MCG/ACT inhaler daily as needed.     No current facility-administered  medications on file prior to visit.     Review of Systems  Constitutional: Positive for fatigue. Negative for activity change, appetite change, fever and unexpected weight change.  HENT: Negative for congestion, rhinorrhea, sore throat and trouble swallowing.   Eyes: Negative for pain, redness, itching and visual disturbance.  Respiratory: Negative for cough, chest tightness, shortness of breath and wheezing.   Cardiovascular: Negative for chest pain and palpitations.  Gastrointestinal: Negative for abdominal pain, blood in stool, constipation, diarrhea and nausea.  Endocrine: Negative for cold intolerance, heat intolerance, polydipsia and polyuria.  Genitourinary: Negative for difficulty urinating, dysuria, frequency and urgency.  Musculoskeletal: Negative for arthralgias, joint swelling and myalgias.  Skin: Negative for pallor and rash.  Neurological: Negative for dizziness, tremors, weakness, numbness and headaches.  Hematological: Negative for adenopathy. Does not bruise/bleed easily.  Psychiatric/Behavioral: Negative for decreased concentration and dysphoric mood. The patient is not nervous/anxious.        Objective:   Physical Exam  Constitutional: He appears well-developed and well-nourished. No distress.  obese and well appearing   HENT:  Head: Normocephalic and atraumatic.  Right Ear: External ear normal.  Left Ear: External ear normal.  Nose: Nose normal.  Mouth/Throat: Oropharynx is clear and moist.  Eyes: Pupils are equal, round, and reactive to light. Conjunctivae and EOM are normal. Right eye exhibits no discharge. Left eye exhibits no discharge. No scleral icterus.  Neck: Normal range of motion. Neck supple. No JVD present. Carotid bruit is not present. No thyromegaly present.  Cardiovascular: Normal rate, regular rhythm, normal heart sounds and intact distal pulses. Exam reveals no gallop.    Pulmonary/Chest: Effort normal and breath sounds normal. No respiratory distress. He has no wheezes. He exhibits no tenderness.  Good air exch  No crackles  Abdominal: Soft. Bowel sounds are normal. He exhibits no distension, no abdominal bruit and no mass. There is no tenderness.  Baseline reducible umbilical hernia  Musculoskeletal: He exhibits no edema or tenderness.  Lymphadenopathy:    He has no cervical adenopathy.  Neurological: He is alert. He has normal reflexes. No cranial nerve deficit. He exhibits normal muscle tone. Coordination normal.  Skin: Skin is warm and dry. No rash noted. No erythema. No pallor.  Solar lentigines diffusely     Psychiatric: He has a normal mood and affect.          Assessment & Plan:   Problem List Items Addressed This Visit      Cardiovascular and Mediastinum   Essential hypertension    bp in fair control at this time  BP Readings from Last 1 Encounters:  11/05/17 134/70   No changes needed Disc lifstyle change with low sodium diet and exercise  Labs rev  Enc wt loss       Relevant Medications   losartan (COZAAR) 100 MG tablet     Digestive   Steatohepatitis, non-alcoholic    LFTs are reassuring today in the nl range        Other   Hyperlipidemia    Disc goals for lipids and reasons to control them Rev labs with pt Rev low sat fat diet in detail Fairly well controlled with diet Disc lower carb diet for trig More exercise for HDL       Relevant Medications   losartan (COZAAR) 100 MG tablet   Obesity    Discussed how this problem influences overall health and the risks it imposes  Reviewed plan for weight loss with lower calorie diet (via better  food choices and also portion control or program like weight watchers) and exercise building up to or more than 30 minutes 5 days per week including some aerobic activity         Prediabetes    . Lab Results  Component Value Date   HGBA1C 6.2 10/31/2017   disc imp of  low glycemic diet and wt loss to prevent DM2       Prostate cancer screening    Family hx in father in later years Lab Results  Component Value Date   PSA 0.66 10/31/2017   PSA 0.78 03/05/2017   PSA 0.68 05/28/2015   No change in voiding habits No change in nocturia       Routine general medical examination at a health care facility - Primary    Reviewed health habits including diet and exercise and skin cancer prevention Reviewed appropriate screening tests for age  Also reviewed health mt list, fam hx and immunization status , as well as social and family history   See HPI Labs reviewed  Wt loss enc  Disc prev DM2

## 2017-11-05 NOTE — Assessment & Plan Note (Signed)
Disc goals for lipids and reasons to control them Rev labs with pt Rev low sat fat diet in detail Fairly well controlled with diet Disc lower carb diet for trig More exercise for HDL

## 2017-11-05 NOTE — Assessment & Plan Note (Signed)
.   Lab Results  Component Value Date   HGBA1C 6.2 10/31/2017   disc imp of low glycemic diet and wt loss to prevent DM2

## 2017-11-05 NOTE — Assessment & Plan Note (Signed)
Reviewed health habits including diet and exercise and skin cancer prevention Reviewed appropriate screening tests for age  Also reviewed health mt list, fam hx and immunization status , as well as social and family history   See HPI Labs reviewed  Wt loss enc  Disc prev DM2

## 2017-11-05 NOTE — Assessment & Plan Note (Signed)
bp in fair control at this time  BP Readings from Last 1 Encounters:  11/05/17 134/70   No changes needed Disc lifstyle change with low sodium diet and exercise  Labs rev  Enc wt loss

## 2017-11-05 NOTE — Assessment & Plan Note (Signed)
LFTs are reassuring today in the nl range

## 2017-11-05 NOTE — Patient Instructions (Addendum)
To prevent diabetes  Try to get most of your carbohydrates from produce (with the exception of white potatoes)  Eat less bread/pasta/rice/snack foods/cereals/sweets and other items from the middle of the grocery store (processed carbs)  This will help weight loss   More exercise will increase good cholesterol  Lower sugar intake will help triglycerides   Weight loss will greatly help diabetic risk and also blood pressure

## 2017-11-05 NOTE — Assessment & Plan Note (Signed)
Family hx in father in later years Lab Results  Component Value Date   PSA 0.66 10/31/2017   PSA 0.78 03/05/2017   PSA 0.68 05/28/2015   No change in voiding habits No change in nocturia

## 2017-11-09 DIAGNOSIS — J301 Allergic rhinitis due to pollen: Secondary | ICD-10-CM | POA: Diagnosis not present

## 2017-11-14 DIAGNOSIS — J301 Allergic rhinitis due to pollen: Secondary | ICD-10-CM | POA: Diagnosis not present

## 2017-11-19 DIAGNOSIS — J301 Allergic rhinitis due to pollen: Secondary | ICD-10-CM | POA: Diagnosis not present

## 2017-11-28 DIAGNOSIS — J301 Allergic rhinitis due to pollen: Secondary | ICD-10-CM | POA: Diagnosis not present

## 2017-12-03 DIAGNOSIS — J301 Allergic rhinitis due to pollen: Secondary | ICD-10-CM | POA: Diagnosis not present

## 2017-12-12 DIAGNOSIS — J301 Allergic rhinitis due to pollen: Secondary | ICD-10-CM | POA: Diagnosis not present

## 2017-12-17 DIAGNOSIS — J301 Allergic rhinitis due to pollen: Secondary | ICD-10-CM | POA: Diagnosis not present

## 2017-12-26 DIAGNOSIS — J301 Allergic rhinitis due to pollen: Secondary | ICD-10-CM | POA: Diagnosis not present

## 2017-12-31 DIAGNOSIS — J301 Allergic rhinitis due to pollen: Secondary | ICD-10-CM | POA: Diagnosis not present

## 2018-01-09 DIAGNOSIS — J301 Allergic rhinitis due to pollen: Secondary | ICD-10-CM | POA: Diagnosis not present

## 2018-01-23 DIAGNOSIS — J301 Allergic rhinitis due to pollen: Secondary | ICD-10-CM | POA: Diagnosis not present

## 2018-01-28 DIAGNOSIS — J301 Allergic rhinitis due to pollen: Secondary | ICD-10-CM | POA: Diagnosis not present

## 2018-02-06 DIAGNOSIS — J301 Allergic rhinitis due to pollen: Secondary | ICD-10-CM | POA: Diagnosis not present

## 2018-02-08 DIAGNOSIS — J301 Allergic rhinitis due to pollen: Secondary | ICD-10-CM | POA: Diagnosis not present

## 2018-02-11 DIAGNOSIS — J301 Allergic rhinitis due to pollen: Secondary | ICD-10-CM | POA: Diagnosis not present

## 2018-02-25 DIAGNOSIS — J301 Allergic rhinitis due to pollen: Secondary | ICD-10-CM | POA: Diagnosis not present

## 2018-02-28 DIAGNOSIS — D485 Neoplasm of uncertain behavior of skin: Secondary | ICD-10-CM | POA: Diagnosis not present

## 2018-02-28 DIAGNOSIS — L578 Other skin changes due to chronic exposure to nonionizing radiation: Secondary | ICD-10-CM | POA: Diagnosis not present

## 2018-02-28 DIAGNOSIS — Z1283 Encounter for screening for malignant neoplasm of skin: Secondary | ICD-10-CM | POA: Diagnosis not present

## 2018-03-21 DIAGNOSIS — J301 Allergic rhinitis due to pollen: Secondary | ICD-10-CM | POA: Diagnosis not present

## 2018-04-03 ENCOUNTER — Other Ambulatory Visit: Payer: Self-pay | Admitting: Family Medicine

## 2018-04-04 DIAGNOSIS — J301 Allergic rhinitis due to pollen: Secondary | ICD-10-CM | POA: Diagnosis not present

## 2018-04-18 DIAGNOSIS — J301 Allergic rhinitis due to pollen: Secondary | ICD-10-CM | POA: Diagnosis not present

## 2018-05-01 DIAGNOSIS — J301 Allergic rhinitis due to pollen: Secondary | ICD-10-CM | POA: Diagnosis not present

## 2018-05-03 DIAGNOSIS — J301 Allergic rhinitis due to pollen: Secondary | ICD-10-CM | POA: Diagnosis not present

## 2018-05-14 ENCOUNTER — Other Ambulatory Visit: Payer: Self-pay | Admitting: *Deleted

## 2018-05-14 MED ORDER — GLUCOSE BLOOD VI STRP: ORAL_STRIP | 3 refills | Status: DC

## 2018-05-15 DIAGNOSIS — J301 Allergic rhinitis due to pollen: Secondary | ICD-10-CM | POA: Diagnosis not present

## 2018-05-20 DIAGNOSIS — J301 Allergic rhinitis due to pollen: Secondary | ICD-10-CM | POA: Diagnosis not present

## 2018-05-30 DIAGNOSIS — J301 Allergic rhinitis due to pollen: Secondary | ICD-10-CM | POA: Diagnosis not present

## 2018-06-12 DIAGNOSIS — J301 Allergic rhinitis due to pollen: Secondary | ICD-10-CM | POA: Diagnosis not present

## 2018-06-17 DIAGNOSIS — J301 Allergic rhinitis due to pollen: Secondary | ICD-10-CM | POA: Diagnosis not present

## 2018-06-26 DIAGNOSIS — J301 Allergic rhinitis due to pollen: Secondary | ICD-10-CM | POA: Diagnosis not present

## 2018-07-01 DIAGNOSIS — J301 Allergic rhinitis due to pollen: Secondary | ICD-10-CM | POA: Diagnosis not present

## 2018-07-11 DIAGNOSIS — J301 Allergic rhinitis due to pollen: Secondary | ICD-10-CM | POA: Diagnosis not present

## 2018-07-23 DIAGNOSIS — J301 Allergic rhinitis due to pollen: Secondary | ICD-10-CM | POA: Diagnosis not present

## 2018-07-25 DIAGNOSIS — J301 Allergic rhinitis due to pollen: Secondary | ICD-10-CM | POA: Diagnosis not present

## 2018-07-30 DIAGNOSIS — J309 Allergic rhinitis, unspecified: Secondary | ICD-10-CM | POA: Diagnosis not present

## 2018-08-07 DIAGNOSIS — J301 Allergic rhinitis due to pollen: Secondary | ICD-10-CM | POA: Diagnosis not present

## 2018-08-12 DIAGNOSIS — J301 Allergic rhinitis due to pollen: Secondary | ICD-10-CM | POA: Diagnosis not present

## 2018-09-19 ENCOUNTER — Telehealth: Payer: Self-pay | Admitting: Family Medicine

## 2018-09-19 NOTE — Telephone Encounter (Signed)
I think it was an ifob kit Please have one for him to pick up -thanks

## 2018-09-19 NOTE — Telephone Encounter (Signed)
I called patient & let him know that I would be placing ifob kit in front office for him to pick up.

## 2018-09-19 NOTE — Telephone Encounter (Signed)
Pt stopped by office to pick up another kit for colonoscopy. He states he lost the last one he received. Unable to find whether it was IFOB or Cologuard. Please advise.

## 2018-09-24 ENCOUNTER — Other Ambulatory Visit: Payer: 59

## 2018-09-24 ENCOUNTER — Telehealth: Payer: Self-pay | Admitting: Radiology

## 2018-09-24 DIAGNOSIS — R195 Other fecal abnormalities: Secondary | ICD-10-CM | POA: Insufficient documentation

## 2018-09-24 NOTE — Addendum Note (Signed)
Addended by: Loura Pardon A on: 09/24/2018 12:40 PM   Modules accepted: Orders

## 2018-09-24 NOTE — Telephone Encounter (Signed)
Please let pt know I would like to schedule a colonoscopy  ? agreeable

## 2018-09-24 NOTE — Telephone Encounter (Signed)
Ref done Will route to [facility]

## 2018-09-24 NOTE — Telephone Encounter (Signed)
[facility] called a POSITIVE ifob, results given to Dr Glori Bickers

## 2018-09-24 NOTE — Telephone Encounter (Signed)
Pt advised of ifob results and agrees with colonoscopy, please put referral in and I advised pt our [facility] will call to schedule appt

## 2018-09-25 ENCOUNTER — Encounter: Payer: Self-pay | Admitting: Internal Medicine

## 2018-09-25 ENCOUNTER — Other Ambulatory Visit: Payer: Self-pay | Admitting: Family Medicine

## 2018-09-25 ENCOUNTER — Other Ambulatory Visit (INDEPENDENT_AMBULATORY_CARE_PROVIDER_SITE_OTHER): Payer: 59

## 2018-09-25 DIAGNOSIS — Z1211 Encounter for screening for malignant neoplasm of colon: Secondary | ICD-10-CM

## 2018-09-25 LAB — FECAL OCCULT BLOOD, IMMUNOCHEMICAL: FECAL OCCULT BLD: POSITIVE — AB

## 2018-09-25 NOTE — Telephone Encounter (Signed)
Called patient and placed Referral to [facility], patient will call to schedule.

## 2018-10-29 ENCOUNTER — Other Ambulatory Visit: Payer: Self-pay | Admitting: Family Medicine

## 2018-11-04 ENCOUNTER — Other Ambulatory Visit: Payer: 59

## 2018-11-06 ENCOUNTER — Other Ambulatory Visit: Payer: 59

## 2018-11-08 ENCOUNTER — Other Ambulatory Visit: Payer: Self-pay | Admitting: Family Medicine

## 2018-11-08 NOTE — Telephone Encounter (Signed)
Pharmacy requests refill on patients blood sugar test strips.   Last OV for routine medical care: 11/05/17  Next appt: 02/06/19 for annual exam  Will refill diabetes supplies for 16month to carry through until his annual in July.

## 2018-11-11 ENCOUNTER — Encounter: Payer: 59 | Admitting: Family Medicine

## 2018-11-14 ENCOUNTER — Encounter: Payer: 59 | Admitting: Family Medicine

## 2018-11-14 ENCOUNTER — Ambulatory Visit: Payer: 59 | Admitting: Internal Medicine

## 2018-11-15 ENCOUNTER — Ambulatory Visit: Payer: 59 | Admitting: Internal Medicine

## 2018-11-28 ENCOUNTER — Encounter: Payer: Self-pay | Admitting: *Deleted

## 2018-12-02 ENCOUNTER — Other Ambulatory Visit: Payer: Self-pay

## 2018-12-02 ENCOUNTER — Ambulatory Visit (INDEPENDENT_AMBULATORY_CARE_PROVIDER_SITE_OTHER): Payer: 59 | Admitting: Internal Medicine

## 2018-12-02 ENCOUNTER — Encounter: Payer: Self-pay | Admitting: Internal Medicine

## 2018-12-02 VITALS — Ht 69.0 in | Wt 218.0 lb

## 2018-12-02 DIAGNOSIS — R195 Other fecal abnormalities: Secondary | ICD-10-CM

## 2018-12-02 NOTE — Progress Notes (Signed)
Patient ID: Terry Dixon, male   DOB: [birth year], 58 y.o.   MRN: [id number]  This service was provided via telemedicine.  Doximity app with AV communication The patient was located outside of his home The provider was located in provider's GI office. The patient did consent to this telephone visit and is aware of possible charges through their insurance for this visit.   The persons participating in this telemedicine service were the patient and I. Time spent on call: 15 min  HPI: Terry Dixon is a 58 year old male with a history of hypertension, hyperlipidemia, fatty liver, who is seen in consultation at the request of Dr. Glori Bickers for heme positive stool.  He is seen virtually today in the setting of COVID-19 pandemic  He reports that he had a fecal stool test performed in March which was positive.  This was ordered by his primary care.  He recalls his normal colonoscopy which was performed on 08/01/2012 and the previous recommendation for a 10-year screening interval.  He reports no change in bowel habit.  No blood in stool or melena of late.  One time in the last 6 months after straining he had a scant amount of red blood with wiping.  This has not recurred.  No melena.  No change in bowel habit.  He will go 1 or 2 times daily for bowel movement and no diarrhea or constipation.  He has lost 10 pounds which he attributes to eating out less in the setting of social distancing.  No family history of colon cancer.  No abdominal pain.  No upper GI or hepatobiliary complaint.  No family history of colon cancer.  On review of his lab work he had a positive I fob test on 09/25/2018  Past Medical History:  Diagnosis Date  . Asthma   . Diverticulitis    colon   . ED (erectile dysfunction)   . Hyperlipidemia   . Hypertension   . Low testosterone   . NASH (nonalcoholic steatohepatitis)   . Obesity     Past Surgical History:  Procedure Laterality Date  . [place]    Outpatient Medications Prior to Visit  Medication Sig Dispense Refill  . albuterol (PROVENTIL HFA;VENTOLIN HFA) 108 (90 BASE) MCG/ACT inhaler Inhale 2 puffs into the lungs every 6 (six) hours as needed for wheezing or shortness of breath. 1 Inhaler 0  . cetirizine (ZYRTEC) 10 MG tablet Take 10 mg by mouth daily as needed for allergies.    . CIALIS 5 MG tablet TAKE 1 TABLET (5 MG TOTAL) BY MOUTH DAILY AS NEEDED FOR ERECTILE DYSFUNCTION. 10 tablet 5  . EPIPEN 2-PAK 0.3 MG/0.3ML SOAJ injection See admin instructions. Reported on 07/06/2015-use as needed  11  . fluticasone (FLONASE) 50 MCG/ACT nasal spray Place 2 sprays into both nostrils daily as needed.   19  . glucose blood (ONE TOUCH ULTRA TEST) test strip CHECK BLOOD SUGAR TWICE A DAY AND AS DIRECTED. DX E11.9 100 each 1  . losartan (COZAAR) 100 MG tablet TAKE 1 TABLET BY MOUTH EVERY DAY 90 tablet 0  . Multiple Vitamin (DAILY MULTIVITAMIN PO) Take 1 tablet by mouth daily.     . Omega-3 Fatty Acids (FISH OIL) 1200 MG CAPS Take by mouth daily.      Glory Rosebush DELICA LANCETS 94W MISC Check blood sugar twice a day and as directed. Dx E11.9 100 each 3  . SYMBICORT 80-4.5  MCG/ACT inhaler daily as needed.     No facility-administered medications prior to visit.     Allergies  Allergen Reactions  . Dairy Aid [Lactase]   . Viagra [Sildenafil Citrate]     Leg pain    Family History  Problem Relation Age of Onset  . Diabetes Mother   . COPD Mother   . Benign prostatic hyperplasia Father   . Hypertension Father   . Breast cancer Sister 76       died at 80  . Prostate cancer Maternal Grandfather   . Colon cancer Neg Hx   . Esophageal cancer Neg Hx   . Liver disease Neg Hx   . Stomach cancer Neg Hx   . Pancreatic cancer Neg Hx     Social History   Tobacco Use  . Smoking status: Former Research scientist (life sciences)  . Smokeless tobacco: Never Used  . Tobacco comment: age 74 for 1 year   Substance Use Topics  . Alcohol use: No     Alcohol/week: 0.0 standard drinks    Frequency: Never  . Drug use: No    ROS: As per history of present illness, otherwise negative  Ht _0  (1.753 m)   Wt 218 lb (98.9 kg)   BMI 32.19 kg/m  No physical exam, virtual visit  RELEVANT LABS AND IMAGING: CBC    Component Value Date/Time   WBC 7.0 10/31/2017 0800   RBC 4.92 10/31/2017 0800   HGB 14.3 10/31/2017 0800   HCT 42.2 10/31/2017 0800   PLT 203.0 10/31/2017 0800   MCV 85.9 10/31/2017 0800   MCHC 33.7 10/31/2017 0800   RDW 14.0 10/31/2017 0800   LYMPHSABS 1.9 10/31/2017 0800   MONOABS 0.5 10/31/2017 0800   EOSABS 0.1 10/31/2017 0800   BASOSABS 0.0 10/31/2017 0800    CMP     Component Value Date/Time   NA 136 10/31/2017 0800   K 4.0 10/31/2017 0800   CL 100 10/31/2017 0800   CO2 26 10/31/2017 0800   GLUCOSE 93 10/31/2017 0800   BUN 15 10/31/2017 0800   CREATININE 1.03 10/31/2017 0800   CALCIUM 9.1 10/31/2017 0800   PROT 6.8 10/31/2017 0800   ALBUMIN 4.4 10/31/2017 0800   AST 26 10/31/2017 0800   ALT 45 10/31/2017 0800   ALKPHOS 73 10/31/2017 0800   BILITOT 1.2 10/31/2017 0800   GFRNONAA 75.05 05/19/2010 0818   GFRAA  08/27/2009 1325    >60        The eGFR has been calculated using the MDRD equation. This calculation has not been validated in all clinical situations. eGFR's persistently <60 mL/min signify possible Chronic Kidney Disease.    ASSESSMENT/PLAN: 58 year old male with a history of hypertension, hyperlipidemia, fatty liver, who is seen in consultation at the request of Dr. Glori Bickers for heme positive stool.  1. + iFOB --he had a normal colonoscopy nearly 6-1/2 years ago.  With his positive iFOB test I recommend we repeat colonoscopy at this time.  We discussed the risk, benefits and alternatives and he is agreeable and wishes to proceed.  He expressed concern about the viral pandemic and I explained that we have measures in place and are following national and local guidelines for practicing  safe endoscopic procedures.  After our discussion he was comfortable proceeding. --Colonoscopy to be scheduled    Cc:[address], [place], [place], [place] [address]

## 2018-12-02 NOTE — Patient Instructions (Addendum)
You have been scheduled for previsit on 12/03/18 at 3:30 pm.  You have been scheduled for colonoscopy on 12/16/18 at 3:00 pm with a 2:00 pm arrival.

## 2018-12-03 ENCOUNTER — Other Ambulatory Visit: Payer: Self-pay

## 2018-12-03 ENCOUNTER — Ambulatory Visit (AMBULATORY_SURGERY_CENTER): Payer: Self-pay | Admitting: *Deleted

## 2018-12-03 ENCOUNTER — Encounter: Payer: Self-pay | Admitting: Internal Medicine

## 2018-12-03 VITALS — Ht 69.0 in | Wt 218.0 lb

## 2018-12-03 DIAGNOSIS — R195 Other fecal abnormalities: Secondary | ICD-10-CM

## 2018-12-03 MED ORDER — NA SULFATE-K SULFATE-MG SULF 17.5-3.13-1.6 GM/177ML PO SOLN: 1.0000 | Freq: Once | ORAL | 0 refills | Status: AC

## 2018-12-06 ENCOUNTER — Ambulatory Visit: Payer: 59 | Admitting: Internal Medicine

## 2018-12-13 ENCOUNTER — Telehealth: Payer: Self-pay | Admitting: *Deleted

## 2018-12-13 NOTE — Telephone Encounter (Signed)
Covid-19 screening questions  Have you traveled in the last 14 days? no If yes where?  Do you now or have you had a fever in the last 14 days? no  Do you have any respiratory symptoms of shortness of breath or cough now or in the last 14 days? no  Do you have any family members or close contacts with diagnosed or suspected Covid-19 in the past 14 days? no  Have you been tested for Covid-19 and found to be positive? No  Pt is aware that care partner will wait in the car during parking lot- I did remind patient that their care partner needs to stay in the parking lot the entire time. Pt will wear mask into building

## 2018-12-16 ENCOUNTER — Encounter: Payer: Self-pay | Admitting: Internal Medicine

## 2018-12-16 ENCOUNTER — Ambulatory Visit (AMBULATORY_SURGERY_CENTER): Payer: 59 | Admitting: Internal Medicine

## 2018-12-16 ENCOUNTER — Other Ambulatory Visit: Payer: Self-pay

## 2018-12-16 VITALS — BP 138/74 | HR 58 | Temp 98.2°F | Resp 20 | Ht 68.0 in | Wt 218.0 lb

## 2018-12-16 DIAGNOSIS — D124 Benign neoplasm of descending colon: Secondary | ICD-10-CM

## 2018-12-16 DIAGNOSIS — R195 Other fecal abnormalities: Secondary | ICD-10-CM

## 2018-12-16 DIAGNOSIS — D122 Benign neoplasm of ascending colon: Secondary | ICD-10-CM

## 2018-12-16 DIAGNOSIS — K635 Polyp of colon: Secondary | ICD-10-CM

## 2018-12-16 MED ORDER — SODIUM CHLORIDE 0.9 % IV SOLN: 500.0000 mL | Freq: Once | INTRAVENOUS | Status: DC

## 2018-12-16 NOTE — Patient Instructions (Signed)
Thank you for allowing [place] to care for you today!  Await pathology results of polyps, next colonscopy will be recommend at that time.  Hold all NSAIDS for 2 weeks  (Ibuprofen, Aspirin, Alleve).  Resume other medications and diet today.  Return to your normal activities tomorrow.  Handouts given for polyps.       YOU HAD AN ENDOSCOPIC PROCEDURE TODAY AT [facility]:   Refer to the procedure report that was given to you for any specific questions about what was found during the examination.  If the procedure report does not answer your questions, please call your gastroenterologist to clarify.  If you requested that your care partner not be given the details of your procedure findings, then the procedure report has been included in a sealed envelope for you to review at your convenience later.  YOU SHOULD EXPECT: Some feelings of bloating in the abdomen. Passage of more gas than usual.  Walking can help get rid of the air that was put into your GI tract during the procedure and reduce the bloating. If you had a lower endoscopy (such as a colonoscopy or flexible sigmoidoscopy) you may notice spotting of blood in your stool or on the toilet paper. If you underwent a bowel prep for your procedure, you may not have a normal bowel movement for a few days.  Please Note:  You might notice some irritation and congestion in your nose or some drainage.  This is from the oxygen used during your procedure.  There is no need for concern and it should clear up in a day or so.  SYMPTOMS TO REPORT IMMEDIATELY:   Following lower endoscopy (colonoscopy or flexible sigmoidoscopy):  Excessive amounts of blood in the stool  Significant tenderness or worsening of abdominal pains  Swelling of the abdomen that is new, acute  Fever of 100F or higher   For urgent or emergent issues, a gastroenterologist can be reached at any hour by calling [phone].   DIET:  We do recommend a small meal  at first, but then you may proceed to your regular diet.  Drink plenty of fluids but you should avoid alcoholic beverages for 24 hours.  ACTIVITY:  You should plan to take it easy for the rest of today and you should NOT DRIVE or use heavy machinery until tomorrow (because of the sedation medicines used during the test).    FOLLOW UP: Our staff will call the number listed on your records 48-72 hours following your procedure to check on you and address any questions or concerns that you may have regarding the information given to you following your procedure. If we do not reach you, we will leave a message.  We will attempt to reach you two times.  During this call, we will ask if you have developed any symptoms of COVID 19. If you develop any symptoms (ie: fever, flu-like symptoms, shortness of breath, cough etc.) before then, please call [phone].  If you test positive for Covid 19 in the 2 weeks post procedure, please call and report this information to [place].    If any biopsies were taken you will be contacted by phone or by letter within the next 1-3 weeks.  Please call us at [phone] if you have not heard about the biopsies in 3 weeks.    SIGNATURES/CONFIDENTIALITY: You and/or your care partner have signed paperwork which will be entered into your electronic medical record.  These signatures attest to the  fact that that the information above on your After Visit Summary has been reviewed and is understood.  Full responsibility of the confidentiality of this discharge information lies with you and/or your care-partner.

## 2018-12-16 NOTE — Progress Notes (Signed)
Courtney California took temp and Judson Roch Monday took vitals.

## 2018-12-16 NOTE — Progress Notes (Signed)
Patient came into recovery sleepy, but nauseated and vomited small amount of phlegm.  Nausea  Subsided after patient received Levsin and started to pass gas.

## 2018-12-16 NOTE — Progress Notes (Signed)
Pt's states no medical or surgical changes since previsit or office visit. 

## 2018-12-16 NOTE — Progress Notes (Signed)
Called to room to assist during endoscopic procedure.  Patient ID and intended procedure confirmed with present staff. Received instructions for my participation in the procedure from the performing physician.  

## 2018-12-16 NOTE — Op Note (Signed)
[facility] Patient Name: Terry Dixon Procedure Date: 12/16/2018 1:55 PM MRN: [id number] Endoscopist: Jerene Bears , MD Age: 58 Referring MD:  Date of Birth: [birth year] Gender: Male Account #: [id number] Procedure:                Colonoscopy Indications:              Positive fecal immunochemical test, normal                            colonoscopy 2014 Medicines:                Monitored Anesthesia Care Procedure:                Pre-Anesthesia Assessment:                           - Prior to the procedure, a History and Physical                            was performed, and patient medications and                            allergies were reviewed. The patient's tolerance of                            previous anesthesia was also reviewed. The risks                            and benefits of the procedure and the sedation                            options and risks were discussed with the patient.                            All questions were answered, and informed consent                            was obtained. Prior Anticoagulants: The patient has                            taken no previous anticoagulant or antiplatelet                            agents. ASA Grade Assessment: II - A patient with                            mild systemic disease. After reviewing the risks                            and benefits, the patient was deemed in                            satisfactory condition to undergo the procedure.  After obtaining informed consent, the colonoscope                            was passed under direct vision. Throughout the                            procedure, the patient's blood pressure, pulse, and                            oxygen saturations were monitored continuously. The                            Colonoscope was introduced through the anus and                            advanced to the terminal ileum. The colonoscopy was                           performed without difficulty. The patient tolerated                            the procedure well. The quality of the bowel                            preparation was good. The ileocecal valve,                            appendiceal orifice, and rectum were photographed. Scope In: 2:29:30 PM Scope Out: 2:49:41 PM Scope Withdrawal Time: 0 hours 18 minutes 46 seconds  Total Procedure Duration: 0 hours 20 minutes 11 seconds  Findings:                 The perianal and digital rectal examinations were                            normal.                           The terminal ileum appeared normal.                           Two semi-pedunculated polyps were found in the                            ascending colon. The polyps were 9 to 10 mm in                            size. These polyps were removed with a hot snare.                            Resection and retrieval were complete.                           A 3 mm polyp was found in the ascending colon. The  polyp was sessile. The polyp was removed with a                            cold snare. Resection and retrieval were complete.                           Two sessile polyps were found in the descending                            colon. The polyps were 2 to 3 mm in size. These                            polyps were removed with a cold snare. Resection                            and retrieval were complete.                           The exam was otherwise without abnormality on                            direct and retroflexion views. Complications:            No immediate complications. Impression:               - The examined portion of the ileum was normal.                           - Two 9 to 10 mm polyps in the ascending colon,                            removed with a hot snare. Resected and retrieved.                           - One 3 mm polyp in the ascending colon, removed                             with a cold snare. Resected and retrieved.                           - Two 2 to 3 mm polyps in the descending colon,                            removed with a cold snare. Resected and retrieved.                           - The examination was otherwise normal on direct                            and retroflexion views. Recommendation:           - Patient has a contact number available for  emergencies. The signs and symptoms of potential                            delayed complications were discussed with the                            patient. Return to normal activities tomorrow.                            Written discharge instructions were provided to the                            patient.                           - Resume previous diet.                           - Continue present medications.                           - Await pathology results.                           - Repeat colonoscopy is recommended for                            surveillance. The colonoscopy date will be                            determined after pathology results from today's                            exam become available for review.                           - No ibuprofen, naproxen, or other non-steroidal                            anti-inflammatory drugs for 2 weeks after polyp                            removal. Jerene Bears, MD 12/16/2018 2:54:45 PM This report has been signed electronically.

## 2018-12-16 NOTE — Progress Notes (Signed)
Report to PACU, RN, vss, BBS= Clear.  

## 2018-12-18 ENCOUNTER — Telehealth: Payer: Self-pay

## 2018-12-18 NOTE — Telephone Encounter (Signed)
  Follow up Call-  Call back number 12/16/2018  Post procedure Call Back phone  # [phone]  Permission to leave phone message Yes  Some recent data might be hidden    Left message

## 2018-12-18 NOTE — Telephone Encounter (Signed)
  Follow up Call-  Call back number 12/16/2018  Post procedure Call Back phone  # [phone]  Permission to leave phone message Yes  Some recent data might be hidden     Patient questions:  Do you have a fever, pain , or abdominal swelling? No. Pain Score  0 *  Have you tolerated food without any problems? Yes.    Have you been able to return to your normal activities? Yes.    Do you have any questions about your discharge instructions: Diet   No. Medications  No. Follow up visit  Yes.    Do you have questions or concerns about your Care? No.  Actions: * If pain score is 4 or above: No action needed, pain <4.   1. Have you developed a fever since your procedure? no  2.   Have you had an respiratory symptoms (SOB or cough) since your procedure? no  3.   Have you tested positive for COVID 19 since your procedure no  4.   Have you had any family members/close contacts diagnosed with the COVID 19 since your procedure?  No    If yes to any of these questions please route to Joylene John, RN and Alphonsa Gin, RN.

## 2018-12-23 ENCOUNTER — Encounter: Payer: Self-pay | Admitting: Internal Medicine

## 2019-01-23 ENCOUNTER — Other Ambulatory Visit: Payer: Self-pay | Admitting: Family Medicine

## 2019-01-30 ENCOUNTER — Telehealth: Payer: Self-pay | Admitting: Family Medicine

## 2019-01-30 DIAGNOSIS — Z125 Encounter for screening for malignant neoplasm of prostate: Secondary | ICD-10-CM

## 2019-01-30 DIAGNOSIS — I10 Essential (primary) hypertension: Secondary | ICD-10-CM

## 2019-01-30 DIAGNOSIS — E7800 Pure hypercholesterolemia, unspecified: Secondary | ICD-10-CM

## 2019-01-30 DIAGNOSIS — R7303 Prediabetes: Secondary | ICD-10-CM

## 2019-01-30 NOTE — Telephone Encounter (Signed)
-----   Message from Cloyd Stagers, RT sent at 01/22/2019  3:12 PM EDT ----- Regarding: Lab Orders for Friday 7.17.2020 Please place lab orders for Friday 7.17.2020, office visit for physical on Thursday 7.23.2020 Thank you, Dyke Maes RT(R)

## 2019-01-31 ENCOUNTER — Other Ambulatory Visit (INDEPENDENT_AMBULATORY_CARE_PROVIDER_SITE_OTHER): Payer: 59

## 2019-01-31 ENCOUNTER — Other Ambulatory Visit: Payer: Self-pay

## 2019-01-31 DIAGNOSIS — I10 Essential (primary) hypertension: Secondary | ICD-10-CM | POA: Diagnosis not present

## 2019-01-31 DIAGNOSIS — R7303 Prediabetes: Secondary | ICD-10-CM | POA: Diagnosis not present

## 2019-01-31 DIAGNOSIS — Z125 Encounter for screening for malignant neoplasm of prostate: Secondary | ICD-10-CM | POA: Diagnosis not present

## 2019-01-31 DIAGNOSIS — E7800 Pure hypercholesterolemia, unspecified: Secondary | ICD-10-CM

## 2019-01-31 LAB — CBC WITH DIFFERENTIAL/PLATELET
Basophils Absolute: 0 10*3/uL (ref 0.0–0.1)
Basophils Relative: 0.3 % (ref 0.0–3.0)
Eosinophils Absolute: 0.1 10*3/uL (ref 0.0–0.7)
Eosinophils Relative: 1.9 % (ref 0.0–5.0)
HCT: 40.8 % (ref 39.0–52.0)
Hemoglobin: 13.6 g/dL (ref 13.0–17.0)
Lymphocytes Relative: 30.2 % (ref 12.0–46.0)
Lymphs Abs: 1.7 10*3/uL (ref 0.7–4.0)
MCHC: 33.4 g/dL (ref 30.0–36.0)
MCV: 84.7 fl (ref 78.0–100.0)
Monocytes Absolute: 0.5 10*3/uL (ref 0.1–1.0)
Monocytes Relative: 9.3 % (ref 3.0–12.0)
Neutro Abs: 3.3 10*3/uL (ref 1.4–7.7)
Neutrophils Relative %: 58.3 % (ref 43.0–77.0)
Platelets: 185 10*3/uL (ref 150.0–400.0)
RBC: 4.82 Mil/uL (ref 4.22–5.81)
RDW: 14.9 % (ref 11.5–15.5)
WBC: 5.7 10*3/uL (ref 4.0–10.5)

## 2019-01-31 LAB — COMPREHENSIVE METABOLIC PANEL
ALT: 29 U/L (ref 0–53)
AST: 21 U/L (ref 0–37)
Albumin: 4.3 g/dL (ref 3.5–5.2)
Alkaline Phosphatase: 69 U/L (ref 39–117)
BUN: 14 mg/dL (ref 6–23)
CO2: 25 mEq/L (ref 19–32)
Calcium: 8.8 mg/dL (ref 8.4–10.5)
Chloride: 107 mEq/L (ref 96–112)
Creatinine, Ser: 0.94 mg/dL (ref 0.40–1.50)
GFR: 82.72 mL/min (ref >60.00)
Glucose, Bld: 86 mg/dL (ref 70–99)
Potassium: 3.9 mEq/L (ref 3.5–5.1)
Sodium: 139 mEq/L (ref 135–145)
Total Bilirubin: 1.2 mg/dL (ref 0.2–1.2)
Total Protein: 6.2 g/dL (ref 6.0–8.3)

## 2019-01-31 LAB — PSA: PSA: 0.78 ng/mL (ref 0.10–4.00)

## 2019-01-31 LAB — LIPID PANEL
Cholesterol: 138 mg/dL (ref 0–200)
HDL: 37.7 mg/dL — ABNORMAL LOW (ref >39.00)
LDL Cholesterol: 72 mg/dL (ref 0–99)
NonHDL: 100.01
Total CHOL/HDL Ratio: 4
Triglycerides: 138 mg/dL (ref 0.0–149.0)
VLDL: 27.6 mg/dL (ref 0.0–40.0)

## 2019-01-31 LAB — TSH: TSH: 1.74 u[IU]/mL (ref 0.35–4.50)

## 2019-01-31 LAB — HEMOGLOBIN A1C: Hgb A1c MFr Bld: 6.1 % (ref 4.6–6.5)

## 2019-02-01 ENCOUNTER — Other Ambulatory Visit: Payer: Self-pay | Admitting: Family Medicine

## 2019-02-03 ENCOUNTER — Other Ambulatory Visit: Payer: 59

## 2019-02-06 ENCOUNTER — Other Ambulatory Visit: Payer: Self-pay

## 2019-02-06 ENCOUNTER — Ambulatory Visit (INDEPENDENT_AMBULATORY_CARE_PROVIDER_SITE_OTHER): Payer: 59 | Admitting: Family Medicine

## 2019-02-06 ENCOUNTER — Encounter: Payer: Self-pay | Admitting: Family Medicine

## 2019-02-06 VITALS — BP 118/70 | HR 50 | Temp 98.0°F | Ht 68.75 in | Wt 214.4 lb

## 2019-02-06 DIAGNOSIS — Z0000 Encounter for general adult medical examination without abnormal findings: Secondary | ICD-10-CM | POA: Diagnosis not present

## 2019-02-06 DIAGNOSIS — F528 Other sexual dysfunction not due to a substance or known physiological condition: Secondary | ICD-10-CM | POA: Diagnosis not present

## 2019-02-06 DIAGNOSIS — K7581 Nonalcoholic steatohepatitis (NASH): Secondary | ICD-10-CM

## 2019-02-06 DIAGNOSIS — Z125 Encounter for screening for malignant neoplasm of prostate: Secondary | ICD-10-CM

## 2019-02-06 DIAGNOSIS — I10 Essential (primary) hypertension: Secondary | ICD-10-CM | POA: Diagnosis not present

## 2019-02-06 DIAGNOSIS — E7800 Pure hypercholesterolemia, unspecified: Secondary | ICD-10-CM | POA: Diagnosis not present

## 2019-02-06 DIAGNOSIS — Z6831 Body mass index (BMI) 31.0-31.9, adult: Secondary | ICD-10-CM

## 2019-02-06 DIAGNOSIS — R7303 Prediabetes: Secondary | ICD-10-CM

## 2019-02-06 DIAGNOSIS — R7989 Other specified abnormal findings of blood chemistry: Secondary | ICD-10-CM

## 2019-02-06 DIAGNOSIS — E6609 Other obesity due to excess calories: Secondary | ICD-10-CM

## 2019-02-06 MED ORDER — LOSARTAN POTASSIUM 100 MG PO TABS: 100.0000 mg | ORAL_TABLET | Freq: Every day | ORAL | 11 refills | Status: DC

## 2019-02-06 NOTE — Assessment & Plan Note (Signed)
Seeing urology Last PSA 0.8  No c/o

## 2019-02-06 NOTE — Assessment & Plan Note (Signed)
Disc goals for lipids and reasons to control them Rev last labs with pt Rev low sat fat diet in detail  Enc fish oil and exercise for HDL

## 2019-02-06 NOTE — Assessment & Plan Note (Signed)
Uses cialis occasionally  Clinically stable

## 2019-02-06 NOTE — Patient Instructions (Addendum)
Please get a flu shot in the fall   shingrix /shingles vaccine is available- you can get on a wait list and call you when it comes in  Check with your ins co first and see if it is covered    Keep up the good work with diet  Add more exercise when you can  Use sunscreen when outdoors

## 2019-02-06 NOTE — Assessment & Plan Note (Signed)
Discussed how this problem influences overall health and the risks it imposes  °Reviewed plan for weight loss with lower calorie diet (via better food choices and also portion control or program like weight watchers) and exercise building up to or more than 30 minutes 5 days per week including some aerobic activity  ° °Commended wt loss so far  °

## 2019-02-06 NOTE — Assessment & Plan Note (Signed)
bp in fair control at this time  BP Readings from Last 1 Encounters:  02/06/19 118/70   No changes needed Most recent labs reviewed  Disc lifstyle change with low sodium diet and exercise

## 2019-02-06 NOTE — Assessment & Plan Note (Signed)
Reviewed health habits including diet and exercise and skin cancer prevention Reviewed appropriate screening tests for age  Also reviewed health mt list, fam hx and immunization status , as well as social and family history    See HPI Labs reviewed  Enc flu shot in the fall  Also discussed shingrix vaccine Enc further wt loss

## 2019-02-06 NOTE — Assessment & Plan Note (Signed)
Normal LFTs Commended wt loss

## 2019-02-06 NOTE — Progress Notes (Signed)
Subjective:    Patient ID: Terry Dixon, male    DOB: 10-05-1960, 58 y.o.   MRN: [id number]  HPI Here for health maintenance exam and to review chronic medical problems  Still working during the pandemic  He gets temp taken twice daily  Now working days   Wt Readings from Last 3 Encounters:  02/06/19 214 lb 7 oz (97.3 kg)  12/16/18 218 lb (98.9 kg)  12/03/18 218 lb (98.9 kg)  wt down 4 lb  Down 15 lb since last visit here !  Eating better - working well  mediterranean diet   (he also checks his glucose at home)  He was going to gym 2 d per week- before covid  Mows yard and works in garden  31.90 kg/m   Flu vaccine - gets yearly  Tdap 3 mo ago at [facility] after injury (cut himself working on a dryer)   Zoster status -interested in shingrix   Colonoscopy 6/20 with 3 y recall  5 polyps  Really glad he went   Prostate health -urology visit 6/19 PSA 0.8 at urology in June  Nocturia times one  Takes Cialis for ED- does not need it often Father had prostate cancer in late life    bp is stable today  No cp or palpitations or headaches or edema  No side effects to medicines  BP Readings from Last 3 Encounters:  02/06/19 118/70  12/16/18 138/74  11/05/17 134/70     Lab Results  Component Value Date   CREATININE 0.94 01/31/2019   BUN 14 01/31/2019   NA 139 01/31/2019   K 3.9 01/31/2019   CL 107 01/31/2019   CO2 25 01/31/2019   Lab Results  Component Value Date   ALT 29 01/31/2019   AST 21 01/31/2019   ALKPHOS 69 01/31/2019   BILITOT 1.2 01/31/2019    Lab Results  Component Value Date   TSH 1.74 01/31/2019   Lab Results  Component Value Date   WBC 5.7 01/31/2019   HGB 13.6 01/31/2019   HCT 40.8 01/31/2019   MCV 84.7 01/31/2019   PLT 185.0 01/31/2019   Hyperlipidemia Lab Results  Component Value Date   CHOL 138 01/31/2019   CHOL 146 10/31/2017   CHOL 137 03/05/2017   Lab Results  Component Value Date   HDL 37.70 (L) 01/31/2019   HDL 39.70  10/31/2017   HDL 33.60 (L) 03/05/2017   Lab Results  Component Value Date   LDLCALC 72 01/31/2019   LDLCALC 75 10/31/2017   LDLCALC 81 03/05/2017   Lab Results  Component Value Date   TRIG 138.0 01/31/2019   TRIG 158.0 (H) 10/31/2017   TRIG 112.0 03/05/2017   Lab Results  Component Value Date   CHOLHDL 4 01/31/2019   CHOLHDL 4 10/31/2017   CHOLHDL 4 03/05/2017   Lab Results  Component Value Date   LDLDIRECT 115.6 11/17/2010   LDLDIRECT 121.8 07/17/2007   LDLDIRECT 100.5 01/03/2007  stable / low HDL but good profile  Taking fish oil    Prediabetes  Lab Results  Component Value Date   HGBA1C 6.1 01/31/2019  this is down from 6.2  Patient Active Problem List   Diagnosis Date Noted  . Occult blood positive stool 09/24/2018  . Prediabetes 03/11/2016  . Prostate cancer screening 12/09/2014  . Steatohepatitis, non-alcoholic 01/74/9449  . Colon cancer screening 05/31/2012  . Low serum testosterone level 11/28/2011  . Routine general medical examination at a health care facility 11/23/2011  .  Family history of non-anemic vitamin B12 deficiency 05/22/2011  . ERECTILE DYSFUNCTION 05/26/2008  . Obesity 01/03/2007  . Hyperlipidemia 01/01/2007  . Essential hypertension 01/01/2007  . DIVERTICULOSIS, COLON 01/01/2007   Past Medical History:  Diagnosis Date  . Asthma   . Diverticulitis    colon   . ED (erectile dysfunction)   . Hyperlipidemia   . Hypertension   . Low testosterone   . NASH (nonalcoholic steatohepatitis)   . Obesity   . Seasonal allergies    Past Surgical History:  Procedure Laterality Date  . Bally   Social History   Tobacco Use  . Smoking status: Former Research scientist (life sciences)  . Smokeless tobacco: Never Used  . Tobacco comment: age 36 for 1 year   Substance Use Topics  . Alcohol use: No    Alcohol/week: 0.0 standard drinks    Frequency: Never  . Drug use: No   Family History  Problem Relation Age of Onset  .  Diabetes Mother   . COPD Mother   . Benign prostatic hyperplasia Father   . Hypertension Father   . Breast cancer Sister 54       died at 32  . Prostate cancer Maternal Grandfather   . Colon cancer Neg Hx   . Esophageal cancer Neg Hx   . Liver disease Neg Hx   . Stomach cancer Neg Hx   . Pancreatic cancer Neg Hx    Allergies  Allergen Reactions  . Dairy Aid [Lactase]   . Viagra [Sildenafil Citrate]     Leg pain   Current Outpatient Medications on File Prior to Visit  Medication Sig Dispense Refill  . albuterol (PROVENTIL HFA;VENTOLIN HFA) 108 (90 BASE) MCG/ACT inhaler Inhale 2 puffs into the lungs every 6 (six) hours as needed for wheezing or shortness of breath. 1 Inhaler 0  . cetirizine (ZYRTEC) 10 MG tablet Take 10 mg by mouth daily as needed for allergies.    . CIALIS 5 MG tablet TAKE 1 TABLET (5 MG TOTAL) BY MOUTH DAILY AS NEEDED FOR ERECTILE DYSFUNCTION. 10 tablet 5  . EPIPEN 2-PAK 0.3 MG/0.3ML SOAJ injection See admin instructions. Reported on 07/06/2015-use as needed  11  . fluticasone (FLONASE) 50 MCG/ACT nasal spray Place 2 sprays into both nostrils daily as needed.   19  . Multiple Vitamin (DAILY MULTIVITAMIN PO) Take 1 tablet by mouth daily.     . Omega-3 Fatty Acids (FISH OIL) 1200 MG CAPS Take by mouth daily.      Glory Rosebush DELICA LANCETS 80S MISC Check blood sugar twice a day and as directed. Dx E11.9 100 each 3  . ONETOUCH ULTRA test strip CHECK BLOOD SUGAR TWICE A DAY AND AS DIRECTED. DX E11.9 50 strip 3  . SYMBICORT 80-4.5 MCG/ACT inhaler daily as needed.     No current facility-administered medications on file prior to visit.     Review of Systems  Constitutional: Negative for activity change, appetite change, fatigue, fever and unexpected weight change.  HENT: Negative for congestion, rhinorrhea, sore throat and trouble swallowing.   Eyes: Negative for pain, redness, itching and visual disturbance.  Respiratory: Negative for cough, chest tightness, shortness  of breath and wheezing.   Cardiovascular: Negative for chest pain and palpitations.  Gastrointestinal: Negative for abdominal pain, blood in stool, constipation, diarrhea and nausea.  Endocrine: Negative for cold intolerance, heat intolerance, polydipsia and polyuria.  Genitourinary: Negative for difficulty urinating, dysuria, frequency  and urgency.  Musculoskeletal: Negative for arthralgias, joint swelling and myalgias.  Skin: Negative for pallor and rash.  Neurological: Negative for dizziness, tremors, weakness, numbness and headaches.  Hematological: Negative for adenopathy. Does not bruise/bleed easily.  Psychiatric/Behavioral: Negative for decreased concentration and dysphoric mood. The patient is not nervous/anxious.        Objective:   Physical Exam Constitutional:      General: He is not in acute distress.    Appearance: Normal appearance. He is well-developed. He is obese. He is not ill-appearing or diaphoretic.     Comments: Wt loss noted   HENT:     Head: Normocephalic and atraumatic.     Right Ear: Tympanic membrane, ear canal and external ear normal.     Left Ear: Tympanic membrane, ear canal and external ear normal.     Nose: Nose normal.     Mouth/Throat:     Mouth: Mucous membranes are moist.     Pharynx: Oropharynx is clear.  Eyes:     General: No scleral icterus.       Right eye: No discharge.        Left eye: No discharge.     Conjunctiva/sclera: Conjunctivae normal.     Pupils: Pupils are equal, round, and reactive to light.  Neck:     Musculoskeletal: Normal range of motion and neck supple. No muscular tenderness.     Thyroid: No thyromegaly.     Vascular: No carotid bruit or JVD.  Cardiovascular:     Rate and Rhythm: Regular rhythm. Bradycardia present.     Pulses: Normal pulses.     Heart sounds: Normal heart sounds. No gallop.   Pulmonary:     Effort: Pulmonary effort is normal. No respiratory distress.     Breath sounds: Normal breath sounds. No  wheezing.  Chest:     Chest wall: No tenderness.  Abdominal:     General: Bowel sounds are normal. There is no distension or abdominal bruit.     Palpations: Abdomen is soft. There is no mass.     Tenderness: There is no abdominal tenderness.     Hernia: No hernia is present.  Musculoskeletal:        General: No tenderness.     Right lower leg: No edema.     Left lower leg: No edema.  Lymphadenopathy:     Cervical: No cervical adenopathy.  Skin:    General: Skin is warm and dry.     Coloration: Skin is not pale.     Findings: No erythema or rash.     Comments: Solar lentigines diffusely Some stable brown nevi  Neurological:     Mental Status: He is alert.     Cranial Nerves: No cranial nerve deficit.     Motor: No abnormal muscle tone.     Coordination: Coordination normal.     Gait: Gait normal.     Deep Tendon Reflexes: Reflexes are normal and symmetric. Reflexes normal.  Psychiatric:        Mood and Affect: Mood normal.           Assessment & Plan:   Problem List Items Addressed This Visit      Cardiovascular and Mediastinum   Essential hypertension    bp in fair control at this time  BP Readings from Last 1 Encounters:  02/06/19 118/70   No changes needed Most recent labs reviewed  Disc lifstyle change with low sodium diet and exercise  Relevant Medications   losartan (COZAAR) 100 MG tablet     Digestive   Steatohepatitis, non-alcoholic    Normal LFTs Commended wt loss         Other   Hyperlipidemia    Disc goals for lipids and reasons to control them Rev last labs with pt Rev low sat fat diet in detail  Enc fish oil and exercise for HDL      Relevant Medications   losartan (COZAAR) 100 MG tablet   Obesity    Discussed how this problem influences overall health and the risks it imposes  Reviewed plan for weight loss with lower calorie diet (via better food choices and also portion control or program like weight watchers) and exercise  building up to or more than 30 minutes 5 days per week including some aerobic activity   Commended wt loss so far       ERECTILE DYSFUNCTION    Uses cialis occasionally  Clinically stable       Routine general medical examination at a health care facility - Primary    Reviewed health habits including diet and exercise and skin cancer prevention Reviewed appropriate screening tests for age  Also reviewed health mt list, fam hx and immunization status , as well as social and family history    See HPI Labs reviewed  Enc flu shot in the fall  Also discussed shingrix vaccine Enc further wt loss       Low serum testosterone level    Sees urology Recent level 338       Prostate cancer screening    Seeing urology Last PSA 0.8  No c/o      Prediabetes    Lab Results  Component Value Date   HGBA1C 6.1 01/31/2019   Commended on wt loss so far disc imp of low glycemic diet and wt loss to prevent DM2

## 2019-02-06 NOTE — Assessment & Plan Note (Signed)
Sees urology Recent level 338

## 2019-02-06 NOTE — Assessment & Plan Note (Signed)
Lab Results  Component Value Date   HGBA1C 6.1 01/31/2019   Commended on wt loss so far disc imp of low glycemic diet and wt loss to prevent DM2

## 2019-03-14 ENCOUNTER — Other Ambulatory Visit: Payer: Self-pay | Admitting: Family Medicine

## 2019-05-04 ENCOUNTER — Other Ambulatory Visit: Payer: Self-pay | Admitting: Family Medicine

## 2019-11-11 ENCOUNTER — Other Ambulatory Visit: Payer: Self-pay | Admitting: Family Medicine

## 2020-01-28 ENCOUNTER — Telehealth: Payer: Self-pay | Admitting: Family Medicine

## 2020-01-28 NOTE — Telephone Encounter (Signed)
See prev note. Pt had colonoscopy on 12/16/18

## 2020-01-28 NOTE — Telephone Encounter (Signed)
Called work # provided and someone said he wasn't available.  Called cell # on file and no answer so left VM letting pt know Dr. Marliss Coots comments, and that she can discuss it directly with him at his CPE later this month

## 2020-01-28 NOTE — Telephone Encounter (Signed)
Not needed- his GI doctor wants to repeat his colonoscopy 3 years from the last one

## 2020-01-28 NOTE — Telephone Encounter (Signed)
Patient contacted the office back and I advised him of Dr. Marliss Coots comments and he verbalized understanding.

## 2020-01-28 NOTE — Telephone Encounter (Signed)
Patient called in stating he is wondering if PCP would like to have patient to have a cologuard test done before appointment on 7/23. No orders written. Please advise.

## 2020-02-08 ENCOUNTER — Telehealth: Payer: Self-pay | Admitting: Family Medicine

## 2020-02-08 DIAGNOSIS — Z125 Encounter for screening for malignant neoplasm of prostate: Secondary | ICD-10-CM

## 2020-02-08 DIAGNOSIS — Z0000 Encounter for general adult medical examination without abnormal findings: Secondary | ICD-10-CM

## 2020-02-08 DIAGNOSIS — I10 Essential (primary) hypertension: Secondary | ICD-10-CM

## 2020-02-08 DIAGNOSIS — R7303 Prediabetes: Secondary | ICD-10-CM

## 2020-02-08 DIAGNOSIS — E7800 Pure hypercholesterolemia, unspecified: Secondary | ICD-10-CM

## 2020-02-08 NOTE — Telephone Encounter (Signed)
-----   Message from Ellamae Sia sent at 01/27/2020 11:28 AM EDT ----- Regarding: Lab orders for Monday, 7.26.21 Patient is scheduled for CPX labs, please order future labs, Thanks , Karna Christmas

## 2020-02-09 ENCOUNTER — Other Ambulatory Visit: Payer: Self-pay

## 2020-02-09 ENCOUNTER — Other Ambulatory Visit (INDEPENDENT_AMBULATORY_CARE_PROVIDER_SITE_OTHER): Payer: 59

## 2020-02-09 DIAGNOSIS — Z125 Encounter for screening for malignant neoplasm of prostate: Secondary | ICD-10-CM | POA: Diagnosis not present

## 2020-02-09 DIAGNOSIS — E7800 Pure hypercholesterolemia, unspecified: Secondary | ICD-10-CM

## 2020-02-09 DIAGNOSIS — I10 Essential (primary) hypertension: Secondary | ICD-10-CM

## 2020-02-09 DIAGNOSIS — R7303 Prediabetes: Secondary | ICD-10-CM | POA: Diagnosis not present

## 2020-02-09 LAB — COMPREHENSIVE METABOLIC PANEL
ALT: 53 U/L (ref 0–53)
AST: 31 U/L (ref 0–37)
Albumin: 4.4 g/dL (ref 3.5–5.2)
Alkaline Phosphatase: 68 U/L (ref 39–117)
BUN: 13 mg/dL (ref 6–23)
CO2: 25 mEq/L (ref 19–32)
Calcium: 9.3 mg/dL (ref 8.4–10.5)
Chloride: 105 mEq/L (ref 96–112)
Creatinine, Ser: 0.97 mg/dL (ref 0.40–1.50)
GFR: 79.48 mL/min (ref >60.00)
Glucose, Bld: 102 mg/dL — ABNORMAL HIGH (ref 70–99)
Potassium: 4.2 mEq/L (ref 3.5–5.1)
Sodium: 138 mEq/L (ref 135–145)
Total Bilirubin: 1.4 mg/dL — ABNORMAL HIGH (ref 0.2–1.2)
Total Protein: 6.6 g/dL (ref 6.0–8.3)

## 2020-02-09 LAB — LIPID PANEL
Cholesterol: 146 mg/dL (ref 0–200)
HDL: 41.3 mg/dL (ref >39.00)
LDL Cholesterol: 79 mg/dL (ref 0–99)
NonHDL: 104.97
Total CHOL/HDL Ratio: 4
Triglycerides: 132 mg/dL (ref 0.0–149.0)
VLDL: 26.4 mg/dL (ref 0.0–40.0)

## 2020-02-09 LAB — CBC WITH DIFFERENTIAL/PLATELET
Basophils Absolute: 0 10*3/uL (ref 0.0–0.1)
Basophils Relative: 0.3 % (ref 0.0–3.0)
Eosinophils Absolute: 0.1 10*3/uL (ref 0.0–0.7)
Eosinophils Relative: 1.8 % (ref 0.0–5.0)
HCT: 44.5 % (ref 39.0–52.0)
Hemoglobin: 15.2 g/dL (ref 13.0–17.0)
Lymphocytes Relative: 28.6 % (ref 12.0–46.0)
Lymphs Abs: 1.8 10*3/uL (ref 0.7–4.0)
MCHC: 34 g/dL (ref 30.0–36.0)
MCV: 88.9 fl (ref 78.0–100.0)
Monocytes Absolute: 0.6 10*3/uL (ref 0.1–1.0)
Monocytes Relative: 9 % (ref 3.0–12.0)
Neutro Abs: 3.8 10*3/uL (ref 1.4–7.7)
Neutrophils Relative %: 60.3 % (ref 43.0–77.0)
Platelets: 186 10*3/uL (ref 150.0–400.0)
RBC: 5.01 Mil/uL (ref 4.22–5.81)
RDW: 13.4 % (ref 11.5–15.5)
WBC: 6.3 10*3/uL (ref 4.0–10.5)

## 2020-02-09 LAB — PSA: PSA: 0.7 ng/mL (ref 0.10–4.00)

## 2020-02-09 LAB — HEMOGLOBIN A1C: Hgb A1c MFr Bld: 6.2 % (ref 4.6–6.5)

## 2020-02-09 LAB — TSH: TSH: 2.15 u[IU]/mL (ref 0.35–4.50)

## 2020-02-10 ENCOUNTER — Encounter: Payer: Self-pay | Admitting: Family Medicine

## 2020-02-10 ENCOUNTER — Ambulatory Visit (INDEPENDENT_AMBULATORY_CARE_PROVIDER_SITE_OTHER): Payer: 59 | Admitting: Family Medicine

## 2020-02-10 VITALS — BP 122/70 | HR 53 | Temp 96.9°F | Ht 69.0 in | Wt 220.2 lb

## 2020-02-10 DIAGNOSIS — I10 Essential (primary) hypertension: Secondary | ICD-10-CM

## 2020-02-10 DIAGNOSIS — Z6832 Body mass index (BMI) 32.0-32.9, adult: Secondary | ICD-10-CM

## 2020-02-10 DIAGNOSIS — Z125 Encounter for screening for malignant neoplasm of prostate: Secondary | ICD-10-CM

## 2020-02-10 DIAGNOSIS — Z0000 Encounter for general adult medical examination without abnormal findings: Secondary | ICD-10-CM | POA: Diagnosis not present

## 2020-02-10 DIAGNOSIS — E7800 Pure hypercholesterolemia, unspecified: Secondary | ICD-10-CM | POA: Diagnosis not present

## 2020-02-10 DIAGNOSIS — R7989 Other specified abnormal findings of blood chemistry: Secondary | ICD-10-CM

## 2020-02-10 DIAGNOSIS — R7303 Prediabetes: Secondary | ICD-10-CM

## 2020-02-10 DIAGNOSIS — E6609 Other obesity due to excess calories: Secondary | ICD-10-CM | POA: Diagnosis not present

## 2020-02-10 MED ORDER — LOSARTAN POTASSIUM 100 MG PO TABS: 100.0000 mg | ORAL_TABLET | Freq: Every day | ORAL | 11 refills | Status: DC

## 2020-02-10 NOTE — Assessment & Plan Note (Signed)
bp in fair control at this time  BP Readings from Last 1 Encounters:  02/10/20 122/70   No changes needed Most recent labs reviewed  Disc lifstyle change with low sodium diet and exercise  Continues losartan  Wt loss enc

## 2020-02-10 NOTE — Assessment & Plan Note (Signed)
Discussed how this problem influences overall health and the risks it imposes  Reviewed plan for weight loss with lower calorie diet (via better food choices and also portion control or program like weight watchers) and exercise building up to or more than 30 minutes 5 days per week including some aerobic activity   Per pt-has lost significant wt from his peak during winter  Commended and enc him to keep working on it

## 2020-02-10 NOTE — Progress Notes (Signed)
Subjective:    Patient ID: Terry Dixon, male    DOB: Apr 09, 1961, 59 y.o.   MRN: [id number]  This visit occurred during the SARS-CoV-2 public health emergency.  Safety protocols were in place, including screening questions prior to the visit, additional usage of staff PPE, and extensive cleaning of exam room while observing appropriate contact time as indicated for disinfecting solutions.    HPI Here for health maintenance exam and to review chronic medical problems    Wt Readings from Last 3 Encounters:  02/10/20 (!) 220 lb 4 oz (99.9 kg)  02/06/19 214 lb 7 oz (97.3 kg)  12/16/18 218 lb (98.9 kg)  up about 6 lb  At home - he weighs and he is down from peak pandemic wt  32.53 kg/m Working a lot  Doing well   Eating healthy  No regular exercise (has active job)  Does not go to the gym    covid status - has not had it  Has not been vaccinated -has thought about it and still considering  He is very careful at work/masking and staying distant    Flu shot-gets them in the fall/usually gets them here  Tdap 4/20 Zoster status -has not had shingles or vaccine    Colonoscopy 6/20 with a 3 y recall   Prostate health Lab Results  Component Value Date   PSA 0.70 02/09/2020   PSA 0.78 01/31/2019   PSA 0.66 10/31/2017  he saw urology 2 weeks ago  All was good/ok  Has had testosterone issues      HTN bp is stable today  No cp or palpitations or headaches or edema  No side effects to medicines  BP Readings from Last 3 Encounters:  02/10/20 122/70  02/06/19 118/70  12/16/18 138/74     Pulse Readings from Last 3 Encounters:  02/10/20 53  02/06/19 (!) 50  12/16/18 (!) 58    Hyperlipidemia Lab Results  Component Value Date   CHOL 146 02/09/2020   CHOL 138 01/31/2019   CHOL 146 10/31/2017   Lab Results  Component Value Date   HDL 41.30 02/09/2020   HDL 37.70 (L) 01/31/2019   HDL 39.70 10/31/2017   Lab Results  Component Value Date   LDLCALC 79  02/09/2020   LDLCALC 72 01/31/2019   LDLCALC 75 10/31/2017   Lab Results  Component Value Date   TRIG 132.0 02/09/2020   TRIG 138.0 01/31/2019   TRIG 158.0 (H) 10/31/2017   Lab Results  Component Value Date   CHOLHDL 4 02/09/2020   CHOLHDL 4 01/31/2019   CHOLHDL 4 10/31/2017   Lab Results  Component Value Date   LDLDIRECT 115.6 11/17/2010   LDLDIRECT 121.8 07/17/2007   LDLDIRECT 100.5 01/03/2007   HDL is up a bit    Prediabetes Lab Results  Component Value Date   HGBA1C 6.2 02/09/2020  last 6.1  M side of family- diabetes    Lab Results  Component Value Date   CREATININE 0.97 02/09/2020   BUN 13 02/09/2020   NA 138 02/09/2020   K 4.2 02/09/2020   CL 105 02/09/2020   CO2 25 02/09/2020   Lab Results  Component Value Date   ALT 53 02/09/2020   AST 31 02/09/2020   ALKPHOS 68 02/09/2020   BILITOT 1.4 (H) 02/09/2020   Lab Results  Component Value Date   TSH 2.15 02/09/2020   Lab Results  Component Value Date   WBC 6.3 02/09/2020   HGB 15.2 02/09/2020  HCT 44.5 02/09/2020   MCV 88.9 02/09/2020   PLT 186.0 02/09/2020   Patient Active Problem List   Diagnosis Date Noted  . Occult blood positive stool 09/24/2018  . Prediabetes 03/11/2016  . Prostate cancer screening 12/09/2014  . Steatohepatitis, non-alcoholic 38/46/6599  . Colon cancer screening 05/31/2012  . Low serum testosterone level 11/28/2011  . Routine general medical examination at a health care facility 11/23/2011  . Family history of non-anemic vitamin B12 deficiency 05/22/2011  . ERECTILE DYSFUNCTION 05/26/2008  . Obesity 01/03/2007  . Hyperlipidemia 01/01/2007  . Essential hypertension 01/01/2007  . DIVERTICULOSIS, COLON 01/01/2007   Past Medical History:  Diagnosis Date  . Asthma   . Diverticulitis    colon   . ED (erectile dysfunction)   . Hyperlipidemia   . Hypertension   . Low testosterone   . NASH (nonalcoholic steatohepatitis)   . Obesity   . Seasonal allergies     Past Surgical History:  Procedure Laterality Date  . [place]   Social History   Tobacco Use  . Smoking status: Former Research scientist (life sciences)  . Smokeless tobacco: Never Used  . Tobacco comment: age 60 for 1 year   Substance Use Topics  . Alcohol use: No    Alcohol/week: 0.0 standard drinks  . Drug use: No   Family History  Problem Relation Age of Onset  . Diabetes Mother   . COPD Mother   . Benign prostatic hyperplasia Father   . Hypertension Father   . Breast cancer Sister 48       died at 31  . Prostate cancer Maternal Grandfather   . Colon cancer Neg Hx   . Esophageal cancer Neg Hx   . Liver disease Neg Hx   . Stomach cancer Neg Hx   . Pancreatic cancer Neg Hx    Allergies  Allergen Reactions  . Dairy Aid [Lactase]   . Viagra [Sildenafil Citrate]     Leg pain   Current Outpatient Medications on File Prior to Visit  Medication Sig Dispense Refill  . albuterol (PROVENTIL HFA;VENTOLIN HFA) 108 (90 BASE) MCG/ACT inhaler Inhale 2 puffs into the lungs every 6 (six) hours as needed for wheezing or shortness of breath. 1 Inhaler 0  . cetirizine (ZYRTEC) 10 MG tablet Take 10 mg by mouth daily as needed for allergies.    . CIALIS 5 MG tablet TAKE 1 TABLET (5 MG TOTAL) BY MOUTH DAILY AS NEEDED FOR ERECTILE DYSFUNCTION. 10 tablet 5  . EPIPEN 2-PAK 0.3 MG/0.3ML SOAJ injection See admin instructions. Reported on 07/06/2015-use as needed  11  . fluticasone (FLONASE) 50 MCG/ACT nasal spray Place 2 sprays into both nostrils daily as needed.   19  . Multiple Vitamin (DAILY MULTIVITAMIN PO) Take 1 tablet by mouth daily.     . Omega-3 Fatty Acids (FISH OIL) 1200 MG CAPS Take by mouth daily.      Glory Rosebush DELICA LANCETS 35T MISC Check blood sugar twice a day and as directed. Dx E11.9 100 each 3  . ONETOUCH ULTRA test strip CHECK BLOOD SUGAR TWICE A DAY AND AS DIRECTED. DX E11.9 50 strip 7  . SYMBICORT 80-4.5 MCG/ACT inhaler daily as needed.     No current  facility-administered medications on file prior to visit.     Review of Systems  Constitutional: Negative for activity change, appetite change, fatigue, fever and unexpected weight change.  HENT: Negative for congestion, rhinorrhea, sore  throat and trouble swallowing.   Eyes: Negative for pain, redness, itching and visual disturbance.  Respiratory: Negative for cough, chest tightness, shortness of breath and wheezing.   Cardiovascular: Negative for chest pain and palpitations.  Gastrointestinal: Negative for abdominal pain, blood in stool, constipation, diarrhea and nausea.  Endocrine: Negative for cold intolerance, heat intolerance, polydipsia and polyuria.  Genitourinary: Negative for difficulty urinating, dysuria, frequency and urgency.  Musculoskeletal: Negative for arthralgias, joint swelling and myalgias.  Skin: Negative for pallor and rash.  Neurological: Negative for dizziness, tremors, weakness, numbness and headaches.  Hematological: Negative for adenopathy. Does not bruise/bleed easily.  Psychiatric/Behavioral: Negative for decreased concentration and dysphoric mood. The patient is not nervous/anxious.        Objective:   Physical Exam Constitutional:      General: He is not in acute distress.    Appearance: Normal appearance. He is well-developed. He is obese. He is not ill-appearing or diaphoretic.  HENT:     Head: Normocephalic and atraumatic.     Right Ear: Tympanic membrane, ear canal and external ear normal.     Left Ear: Tympanic membrane, ear canal and external ear normal.     Nose: Nose normal. No congestion.     Mouth/Throat:     Mouth: Mucous membranes are moist.     Pharynx: Oropharynx is clear. No posterior oropharyngeal erythema.  Eyes:     General: No scleral icterus.       Right eye: No discharge.        Left eye: No discharge.     Conjunctiva/sclera: Conjunctivae normal.     Pupils: Pupils are equal, round, and reactive to light.  Neck:     Thyroid:  No thyromegaly.     Vascular: No carotid bruit or JVD.  Cardiovascular:     Rate and Rhythm: Normal rate and regular rhythm.     Pulses: Normal pulses.     Heart sounds: Normal heart sounds. No gallop.   Pulmonary:     Effort: Pulmonary effort is normal. No respiratory distress.     Breath sounds: Normal breath sounds. No wheezing or rales.     Comments: Good air exch Chest:     Chest wall: No tenderness.  Abdominal:     General: Bowel sounds are normal. There is no distension or abdominal bruit.     Palpations: Abdomen is soft. There is no mass.     Tenderness: There is no abdominal tenderness.     Hernia: A hernia is present.     Comments: Protuberant abdomen Umbilical hernia   Musculoskeletal:        General: No tenderness.     Cervical back: Normal range of motion and neck supple. No rigidity. No muscular tenderness.     Right lower leg: No edema.     Left lower leg: No edema.  Lymphadenopathy:     Cervical: No cervical adenopathy.  Skin:    General: Skin is warm and dry.     Coloration: Skin is not pale.     Findings: No erythema or rash.     Comments: Solar lentigines diffusely Tanned on arms  Neurological:     Mental Status: He is alert.     Cranial Nerves: No cranial nerve deficit.     Motor: No abnormal muscle tone.     Coordination: Coordination normal.     Gait: Gait normal.     Deep Tendon Reflexes: Reflexes are normal and symmetric. Reflexes normal.  Psychiatric:  Mood and Affect: Mood normal.        Cognition and Memory: Cognition and memory normal.     Comments: Pleasant            Assessment & Plan:   Problem List Items Addressed This Visit      Cardiovascular and Mediastinum   Essential hypertension    bp in fair control at this time  BP Readings from Last 1 Encounters:  02/10/20 122/70   No changes needed Most recent labs reviewed  Disc lifstyle change with low sodium diet and exercise  Continues losartan  Wt loss enc       Relevant Medications   losartan (COZAAR) 100 MG tablet     Other   Hyperlipidemia    Stable low LDL with slt inc in HDL Disc goals for lipids and reasons to control them Rev last labs with pt Rev low sat fat diet in detail Enc to work on exercise      Relevant Medications   losartan (COZAAR) 100 MG tablet   Obesity    Discussed how this problem influences overall health and the risks it imposes  Reviewed plan for weight loss with lower calorie diet (via better food choices and also portion control or program like weight watchers) and exercise building up to or more than 30 minutes 5 days per week including some aerobic activity   Per pt-has lost significant wt from his peak during winter  Commended and enc him to keep working on it       Routine general medical examination at a health care facility - Primary    Reviewed health habits including diet and exercise and skin cancer prevention Reviewed appropriate screening tests for age  Also reviewed health mt list, fam hx and immunization status , as well as social and family history   See HPI Labs reviewed  Encouraged strongly to consider covid vaccine  Also discussed option of shigrix vaccine Enc more regular exercise  Enc flu shot in fall (he usually gets) Stable psa with no clinical urinary changes Colonoscopy will be due in 2023        Low serum testosterone level    Continues urology visits  Not currently taking tx      Prostate cancer screening    Lab Results  Component Value Date   PSA 0.70 02/09/2020   PSA 0.78 01/31/2019   PSA 0.66 10/31/2017    No changes in urinary habits      Prediabetes    Lab Results  Component Value Date   HGBA1C 6.2 02/09/2020   disc imp of low glycemic diet and wt loss to prevent DM2  Enc him to keep working on wt loss

## 2020-02-10 NOTE — Assessment & Plan Note (Signed)
Continues urology visits  Not currently taking tx

## 2020-02-10 NOTE — Assessment & Plan Note (Signed)
Lab Results  Component Value Date   PSA 0.70 02/09/2020   PSA 0.78 01/31/2019   PSA 0.66 10/31/2017    No changes in urinary habits

## 2020-02-10 NOTE — Assessment & Plan Note (Signed)
Reviewed health habits including diet and exercise and skin cancer prevention Reviewed appropriate screening tests for age  Also reviewed health mt list, fam hx and immunization status , as well as social and family history   See HPI Labs reviewed  Encouraged strongly to consider covid vaccine  Also discussed option of shigrix vaccine Enc more regular exercise  Enc flu shot in fall (he usually gets) Stable psa with no clinical urinary changes Colonoscopy will be due in 2023

## 2020-02-10 NOTE — Patient Instructions (Addendum)
If you are interested in the shingles vaccine series (Shingrix), call your insurance or pharmacy to check on coverage and location it must be given.  If affordable - you can schedule it here or at your pharmacy depending on coverage   Try to add 30 or more minutes of exercise daily (in or outdoor)   For diabetes prevention Try to get most of your carbohydrates from produce (with the exception of white potatoes)  Eat less bread/pasta/rice/snack foods/cereals/sweets and other items from the middle of the grocery store (processed carbs)

## 2020-02-10 NOTE — Assessment & Plan Note (Signed)
Lab Results  Component Value Date   HGBA1C 6.2 02/09/2020   disc imp of low glycemic diet and wt loss to prevent DM2  Enc him to keep working on wt loss

## 2020-02-10 NOTE — Assessment & Plan Note (Signed)
Stable low LDL with slt inc in HDL Disc goals for lipids and reasons to control them Rev last labs with pt Rev low sat fat diet in detail Enc to work on exercise

## 2020-02-12 ENCOUNTER — Encounter: Payer: 59 | Admitting: Family Medicine

## 2020-05-25 ENCOUNTER — Other Ambulatory Visit: Payer: Self-pay | Admitting: Family Medicine

## 2020-11-18 ENCOUNTER — Telehealth: Payer: Self-pay | Admitting: Family Medicine

## 2020-11-18 DIAGNOSIS — R7989 Other specified abnormal findings of blood chemistry: Secondary | ICD-10-CM

## 2020-11-18 DIAGNOSIS — Z125 Encounter for screening for malignant neoplasm of prostate: Secondary | ICD-10-CM

## 2020-11-18 NOTE — Telephone Encounter (Signed)
Pt called in wanted to know about getting a referral to a urologist due to he is having problems with his prostate. He normally sees Dr. Rogers Blocker but he wants a second opinion.  And can go to either [place] or [place]

## 2020-11-20 NOTE — Addendum Note (Signed)
Addended by: Loura Pardon A on: 11/20/2020 09:00 AM   Modules accepted: Orders

## 2020-11-20 NOTE — Telephone Encounter (Signed)
I put the referral in.

## 2020-12-17 ENCOUNTER — Ambulatory Visit (INDEPENDENT_AMBULATORY_CARE_PROVIDER_SITE_OTHER): Payer: 59 | Admitting: Urology

## 2020-12-17 ENCOUNTER — Other Ambulatory Visit: Payer: Self-pay

## 2020-12-17 ENCOUNTER — Encounter: Payer: Self-pay | Admitting: Urology

## 2020-12-17 VITALS — BP 162/96 | HR 55 | Ht 69.0 in | Wt 226.0 lb

## 2020-12-17 DIAGNOSIS — R3912 Poor urinary stream: Secondary | ICD-10-CM

## 2020-12-17 DIAGNOSIS — N401 Enlarged prostate with lower urinary tract symptoms: Secondary | ICD-10-CM | POA: Diagnosis not present

## 2020-12-17 NOTE — Progress Notes (Signed)
12/17/2020 9:00 AM   Terry Dixon 06-Jun-1961 [id number]  Referring provider: Abner Greenspan, MD [address] [place],  [place] [address]  Chief Complaint  Patient presents with  . Other    HPI: Terry Dixon is a 60 y.o. male referred for second opinion.   States he had a vasectomy by Dr. Yves Dill 1997 and has seen him in regularly since that time  States in March 2022 he developed bothersome lower urinary tract symptoms of frequency and decreased force and caliber of his urinary stream.  Was started on tamsulosin with 80% improvement in his symptoms  He states PSA has been checked regularly and in the normal range and stable  He had 2 friends recently diagnosed with prostate cancer and was concerned about that possibility and desired a second opinion  Denies dysuria, gross hematuria  No flank, abdominal or pelvic pain      PMH: Past Medical History:  Diagnosis Date  . Asthma   . Diverticulitis    colon   . ED (erectile dysfunction)   . Hyperlipidemia   . Hypertension   . Low testosterone   . NASH (nonalcoholic steatohepatitis)   . Obesity   . Seasonal allergies     Surgical History: Past Surgical History:  Procedure Laterality Date  . HERNIA REPAIR  2000   [facility]  . VASECTOMY  1999    Home Medications:  Allergies as of 12/17/2020      Reactions   Dairy Aid [lactase]    Viagra [sildenafil Citrate]    Leg pain      Medication List       Accurate as of December 17, 2020  9:00 AM. If you have any questions, ask your nurse or doctor.        albuterol 108 (90 Base) MCG/ACT inhaler Commonly known as: VENTOLIN HFA Inhale 2 puffs into the lungs every 6 (six) hours as needed for wheezing or shortness of breath.   cetirizine 10 MG tablet Commonly known as: ZYRTEC Take 10 mg by mouth daily as needed for allergies.   Cialis 5 MG tablet Generic drug: tadalafil TAKE 1 TABLET (5 MG TOTAL) BY MOUTH DAILY AS NEEDED FOR ERECTILE DYSFUNCTION.    DAILY MULTIVITAMIN PO Take 1 tablet by mouth daily.   EpiPen 2-Pak 0.3 mg/0.3 mL Soaj injection Generic drug: EPINEPHrine See admin instructions. Reported on 07/06/2015-use as needed   Fish Oil 1200 MG Caps Take by mouth daily.   fluticasone 50 MCG/ACT nasal spray Commonly known as: FLONASE Place 2 sprays into both nostrils daily as needed.   losartan 100 MG tablet Commonly known as: COZAAR Take 1 tablet (100 mg total) by mouth daily.   OneTouch Delica Lancets 17G Misc Check blood sugar twice a day and as directed. Dx E11.9   OneTouch Ultra test strip Generic drug: glucose blood CHECK BLOOD SUGAR TWICE A DAY AND AS DIRECTED. DX E11.9   Symbicort 80-4.5 MCG/ACT inhaler Generic drug: budesonide-formoterol daily as needed.   tamsulosin 0.4 MG Caps capsule Commonly known as: FLOMAX Take 0.4 mg by mouth.       Allergies:  Allergies  Allergen Reactions  . Dairy Aid [Lactase]   . Viagra [Sildenafil Citrate]     Leg pain    Family History: Family History  Problem Relation Age of Onset  . Diabetes Mother   . COPD Mother   . Benign prostatic hyperplasia Father   . Hypertension Father   . Breast cancer  Sister 104       died at 11  . Prostate cancer Maternal Grandfather   . Colon cancer Neg Hx   . Esophageal cancer Neg Hx   . Liver disease Neg Hx   . Stomach cancer Neg Hx   . Pancreatic cancer Neg Hx     Social History:  reports that he has quit smoking. He has never used smokeless tobacco. He reports that he does not drink alcohol and does not use drugs.   Physical Exam: BP (!) 162/96   Pulse (!) 55   Ht 5' 9"  (1.753 m)   Wt 226 lb (102.5 kg)   BMI 33.37 kg/m   Constitutional:  Alert and oriented, No acute distress. HEENT: [place] AT, moist mucus membranes.  Trachea midline, no masses. Cardiovascular: No clubbing, cyanosis, or edema. Respiratory: Normal respiratory effort, no increased work of breathing. GU: Prostate 35 g, smooth without  nodules Neurologic: Grossly intact, no focal deficits, moving all 4 extremities. Psychiatric: Normal mood and affect.   Assessment & Plan:    1.  BPH with LUTS  Voiding symptoms improved with tamsulosin  PSA low and stable and benign DRE.  He was reassured there is no evidence of prostate cancer  He will continue his regular visits with Dr. Tyson Alias, MD  [facility] [address], [place], [place] [address] [phone]

## 2020-12-17 NOTE — Patient Instructions (Signed)
Notified patient as instructed, patient pleased. Discussed follow-up appointments, patient agrees  

## 2021-01-04 ENCOUNTER — Encounter: Payer: Self-pay | Admitting: Family Medicine

## 2021-01-04 ENCOUNTER — Telehealth: Payer: Self-pay | Admitting: *Deleted

## 2021-01-04 ENCOUNTER — Other Ambulatory Visit: Payer: Self-pay | Admitting: Family Medicine

## 2021-01-04 ENCOUNTER — Other Ambulatory Visit (INDEPENDENT_AMBULATORY_CARE_PROVIDER_SITE_OTHER): Payer: 59

## 2021-01-04 ENCOUNTER — Telehealth (INDEPENDENT_AMBULATORY_CARE_PROVIDER_SITE_OTHER): Payer: 59 | Admitting: Family Medicine

## 2021-01-04 ENCOUNTER — Other Ambulatory Visit: Payer: Self-pay

## 2021-01-04 DIAGNOSIS — U071 COVID-19: Secondary | ICD-10-CM

## 2021-01-04 LAB — COMPREHENSIVE METABOLIC PANEL
ALT: 110 U/L — ABNORMAL HIGH (ref 0–53)
AST: 99 U/L — ABNORMAL HIGH (ref 0–37)
Albumin: 4.4 g/dL (ref 3.5–5.2)
Alkaline Phosphatase: 73 U/L (ref 39–117)
BUN: 18 mg/dL (ref 6–23)
CO2: 26 mEq/L (ref 19–32)
Calcium: 9 mg/dL (ref 8.4–10.5)
Chloride: 105 mEq/L (ref 96–112)
Creatinine, Ser: 1.12 mg/dL (ref 0.40–1.50)
GFR: 72.2 mL/min (ref >60.00)
Glucose, Bld: 149 mg/dL — ABNORMAL HIGH (ref 70–99)
Potassium: 3.8 mEq/L (ref 3.5–5.1)
Sodium: 140 mEq/L (ref 135–145)
Total Bilirubin: 0.6 mg/dL (ref 0.2–1.2)
Total Protein: 6.6 g/dL (ref 6.0–8.3)

## 2021-01-04 MED ORDER — MOLNUPIRAVIR EUA 200MG CAPSULE: 4.0000 | ORAL_CAPSULE | Freq: Two times a day (BID) | ORAL | 0 refills | Status: AC

## 2021-01-04 MED ORDER — PROMETHAZINE-DM 6.25-15 MG/5ML PO SYRP: 5.0000 mL | ORAL_SOLUTION | Freq: Four times a day (QID) | ORAL | 0 refills | Status: DC | PRN

## 2021-01-04 NOTE — Telephone Encounter (Deleted)
Dr. Glori Bickers Please see message from Dr. Etter Sjogren.

## 2021-01-04 NOTE — Telephone Encounter (Signed)
Patient called stating that he started with covid symptoms yesterday and tested positive. Patient stated that he now has a sore throat and dry cough. Patient stated that he has coughed so much that his lungs hurt. Patient stated that he has been taking Mucinex DM for his symptoms. Patient denies SOB or difficulty breathing.. Patient stated that he feels better today than he did yesterday. Patient scheduled for a virtual visit today with Dr. Etter Sjogren at 4:20 pm.  Patient was given ER precautions and he verbalized understanding.

## 2021-01-04 NOTE — Progress Notes (Signed)
Virtual telephone visit    Virtual Visit via Telephone Note   This visit type was conducted due to national recommendations for restrictions regarding the COVID-19 Pandemic (e.g. social distancing) in an effort to limit this patient's exposure and mitigate transmission in our community. Due to his co-morbid illnesses, this patient is at least at moderate risk for complications without adequate follow up. This format is felt to be most appropriate for this patient at this time. The patient did not have access to video technology or had technical difficulties with video requiring transitioning to audio format only (telephone). Physical exam was limited to content and character of the telephone converstion. Terry Dixon was able to get the patient set up on a telephone visit.  Video connection was lost at <50% of the duration of the visit, at which time the remainder of the visit was completed via audio only.  Patient location: home  Patient and provider in visit Provider location: Office  I discussed the limitations of evaluation and management by telemedicine and the availability of in person appointments. The patient expressed understanding and agreed to proceed.   Visit Date: 01/04/2021  Today's healthcare provider: Ann Held, DO     Subjective:    Patient ID: Terry Dixon, male    DOB: 1961-06-08, 60 y.o.   MRN: [id number]  Chief Complaint  Patient presents with   Covid Positive    Patient stated that he now has a sore throat and dry cough. Patient stated that he has coughed so much that his lungs hurt. Patient stated that he has been taking Mucinex DM for his symptoms. Patient denies SOB or difficulty breathing.. Patient stated that he feels better today than he did yesterday   +  HPI Patient is in today for covid +---  + cough and he is using mucinex dm max     cough is dry.  No fevers,  + sore throat  and lungs hurt from the coughing   pt is not sleeping  well   his covid test was done Monday night and was positive   Past Medical History:  Diagnosis Date   Asthma    Diverticulitis    colon    ED (erectile dysfunction)    Hyperlipidemia    Hypertension    Low testosterone    NASH (nonalcoholic steatohepatitis)    Obesity    Seasonal allergies     Past Surgical History:  Procedure Laterality Date   HERNIA REPAIR  2000   R & L   VASECTOMY  1999    Family History  Problem Relation Age of Onset   Diabetes Mother    COPD Mother    Benign prostatic hyperplasia Father    Hypertension Father    Breast cancer Sister 28       died at 48   Prostate cancer Maternal Grandfather    Colon cancer Neg Hx    Esophageal cancer Neg Hx    Liver disease Neg Hx    Stomach cancer Neg Hx    Pancreatic cancer Neg Hx     Social History   Socioeconomic History   Marital status: Divorced    Spouse name: Not on file   Number of children: 1   Years of education: Not on file   Highest education level: Not on file  Occupational History   Occupation: R. FTax adviser (electrician)  Tobacco Use   Smoking status: Former    Pack years: 0.00  Smokeless tobacco: Never   Tobacco comments:    age 76 for 1 year   Substance and Sexual Activity   Alcohol use: No    Alcohol/week: 0.0 standard drinks   Drug use: No   Sexual activity: Not on file  Other Topics Concern   Not on file  Social History Narrative   Regular exercise- yes, gym 2 x week, mnt. Biking 1 x week, aerobics 1 hr 2 x week.    Social Determinants of Health   Financial Resource Strain: Not on file  Food Insecurity: Not on file  Transportation Needs: Not on file  Physical Activity: Not on file  Stress: Not on file  Social Connections: Not on file  Intimate Partner Violence: Not on file    Outpatient Medications Prior to Visit  Medication Sig Dispense Refill   albuterol (PROVENTIL HFA;VENTOLIN HFA) 108 (90 BASE) MCG/ACT inhaler Inhale 2 puffs into the lungs every 6 (six) hours  as needed for wheezing or shortness of breath. 1 Inhaler 0   cetirizine (ZYRTEC) 10 MG tablet Take 10 mg by mouth daily as needed for allergies.     CIALIS 5 MG tablet TAKE 1 TABLET (5 MG TOTAL) BY MOUTH DAILY AS NEEDED FOR ERECTILE DYSFUNCTION. 10 tablet 5   EPIPEN 2-PAK 0.3 MG/0.3ML SOAJ injection See admin instructions. Reported on 07/06/2015-use as needed  11   fluticasone (FLONASE) 50 MCG/ACT nasal spray Place 2 sprays into both nostrils daily as needed.   19   losartan (COZAAR) 100 MG tablet Take 1 tablet (100 mg total) by mouth daily. 30 tablet 11   Multiple Vitamin (DAILY MULTIVITAMIN PO) Take 1 tablet by mouth daily.     Omega-3 Fatty Acids (FISH OIL) 1200 MG CAPS Take by mouth daily.     ONETOUCH DELICA LANCETS 09O MISC Check blood sugar twice a day and as directed. Dx E11.9 100 each 3   ONETOUCH ULTRA test strip CHECK BLOOD SUGAR TWICE A DAY AND AS DIRECTED. DX E11.9 50 strip 11   SYMBICORT 80-4.5 MCG/ACT inhaler daily as needed.     tamsulosin (FLOMAX) 0.4 MG CAPS capsule Take 0.4 mg by mouth.     No facility-administered medications prior to visit.    Allergies  Allergen Reactions   Dairy Aid [Lactase]    Viagra [Sildenafil Citrate]     Leg pain    Review of Systems  Constitutional:  Negative for chills, fever and malaise/fatigue.  HENT:  Positive for sore throat. Negative for congestion.   Eyes:  Negative for blurred vision.  Respiratory:  Positive for cough. Negative for sputum production, shortness of breath and wheezing.   Cardiovascular:  Negative for chest pain, palpitations and leg swelling.  Gastrointestinal:  Negative for vomiting.  Musculoskeletal:  Negative for back pain.  Skin:  Negative for rash.  Neurological:  Negative for loss of consciousness and headaches.      Objective:    Physical Exam Vitals and nursing note reviewed.  Constitutional:      General: He is not in acute distress. Pulmonary:     Effort: Pulmonary effort is normal.   Neurological:     General: No focal deficit present.     Mental Status: He is alert.  Psychiatric:        Mood and Affect: Mood normal.        Behavior: Behavior normal.        Thought Content: Thought content normal.        Judgment: Judgment normal.  There were no vitals taken for this visit. Wt Readings from Last 3 Encounters:  12/17/20 226 lb (102.5 kg)  02/10/20 (!) 220 lb 4 oz (99.9 kg)  02/06/19 214 lb 7 oz (97.3 kg)    Diabetic Foot Exam - Simple   No data filed    Lab Results  Component Value Date   WBC 6.3 02/09/2020   HGB 15.2 02/09/2020   HCT 44.5 02/09/2020   PLT 186.0 02/09/2020   GLUCOSE 149 (H) 01/04/2021   CHOL 146 02/09/2020   TRIG 132.0 02/09/2020   HDL 41.30 02/09/2020   LDLDIRECT 115.6 11/17/2010   LDLCALC 79 02/09/2020   ALT 110 (H) 01/04/2021   AST 99 (H) 01/04/2021   NA 140 01/04/2021   K 3.8 01/04/2021   CL 105 01/04/2021   CREATININE 1.12 01/04/2021   BUN 18 01/04/2021   CO2 26 01/04/2021   TSH 2.15 02/09/2020   PSA 0.70 02/09/2020   HGBA1C 6.2 02/09/2020    Lab Results  Component Value Date   TSH 2.15 02/09/2020   Lab Results  Component Value Date   WBC 6.3 02/09/2020   HGB 15.2 02/09/2020   HCT 44.5 02/09/2020   MCV 88.9 02/09/2020   PLT 186.0 02/09/2020   Lab Results  Component Value Date   NA 140 01/04/2021   K 3.8 01/04/2021   CO2 26 01/04/2021   GLUCOSE 149 (H) 01/04/2021   BUN 18 01/04/2021   CREATININE 1.12 01/04/2021   BILITOT 0.6 01/04/2021   ALKPHOS 73 01/04/2021   AST 99 (H) 01/04/2021   ALT 110 (H) 01/04/2021   PROT 6.6 01/04/2021   ALBUMIN 4.4 01/04/2021   CALCIUM 9.0 01/04/2021   GFR 72.20 01/04/2021   Lab Results  Component Value Date   CHOL 146 02/09/2020   Lab Results  Component Value Date   HDL 41.30 02/09/2020   Lab Results  Component Value Date   LDLCALC 79 02/09/2020   Lab Results  Component Value Date   TRIG 132.0 02/09/2020   Lab Results  Component Value Date   CHOLHDL 4  02/09/2020   Lab Results  Component Value Date   HGBA1C 6.2 02/09/2020       Assessment & Plan:   Problem List Items Addressed This Visit   None Visit Diagnoses     COVID-19    -  Primary   Relevant Medications   promethazine-dextromethorphan (PROMETHAZINE-DM) 6.25-15 MG/5ML syrup   molnupiravir EUA 200 mg CAPS   Other Relevant Orders   MyChart COVID-19 home monitoring program   Temperature monitoring       I am having Terry Dixon start on promethazine-dextromethorphan and molnupiravir EUA. I am also having him maintain his Fish Oil, Multiple Vitamin (DAILY MULTIVITAMIN PO), cetirizine, albuterol, EpiPen 2-Pak, fluticasone, OneTouch Delica Lancets 96V, Symbicort, Cialis, losartan, OneTouch Ultra, and tamsulosin.  Meds ordered this encounter  Medications   promethazine-dextromethorphan (PROMETHAZINE-DM) 6.25-15 MG/5ML syrup    Sig: Take 5 mLs by mouth 4 (four) times daily as needed.    Dispense:  118 mL    Refill:  0   molnupiravir EUA 200 mg CAPS    Sig: Take 4 capsules (800 mg total) by mouth 2 (two) times daily for 5 days.    Dispense:  40 capsule    Refill:  0     I discussed the assessment and treatment plan with the patient. The patient was provided an opportunity to ask questions and all were answered. The patient agreed  with the plan and demonstrated an understanding of the instructions.   The patient was advised to call back or seek an in-person evaluation if the symptoms worsen or if the condition fails to improve as anticipated.  I provided 25 minutes of non-face-to-face time during this encounter. Reviewing chart and talking to the pt    Ann Held, [place] at [employer] [facility] [phone] (phone) [phone] (fax)  [place]

## 2021-01-04 NOTE — Telephone Encounter (Signed)
Is this something that your office will take care of?

## 2021-01-04 NOTE — Telephone Encounter (Signed)
Spoke to patient by telephone and scheduled him for a drive around lab draw in his car today at 3:00 pm.

## 2021-01-04 NOTE — Assessment & Plan Note (Signed)
molnupiravir sent in due to elevated liver function -- pt unable to take paxlovid Cough med per orders  Pt to call in if symptoms worsen or there is no improvement  F/u pcp about liver function

## 2021-02-10 ENCOUNTER — Telehealth: Payer: Self-pay | Admitting: Family Medicine

## 2021-02-10 DIAGNOSIS — E7800 Pure hypercholesterolemia, unspecified: Secondary | ICD-10-CM

## 2021-02-10 DIAGNOSIS — R7303 Prediabetes: Secondary | ICD-10-CM

## 2021-02-10 DIAGNOSIS — I10 Essential (primary) hypertension: Secondary | ICD-10-CM

## 2021-02-10 DIAGNOSIS — Z125 Encounter for screening for malignant neoplasm of prostate: Secondary | ICD-10-CM

## 2021-02-10 NOTE — Telephone Encounter (Signed)
-----   Message from Ellamae Sia sent at 01/24/2021  9:16 AM EDT ----- Regarding: Lab orderes for Friday, 7.29.22 Patient is scheduled for CPX labs, please order future labs, Thanks , Karna Christmas

## 2021-02-11 ENCOUNTER — Other Ambulatory Visit (INDEPENDENT_AMBULATORY_CARE_PROVIDER_SITE_OTHER): Payer: 59

## 2021-02-11 ENCOUNTER — Other Ambulatory Visit: Payer: Self-pay

## 2021-02-11 DIAGNOSIS — R7303 Prediabetes: Secondary | ICD-10-CM

## 2021-02-11 DIAGNOSIS — I10 Essential (primary) hypertension: Secondary | ICD-10-CM

## 2021-02-11 DIAGNOSIS — E7800 Pure hypercholesterolemia, unspecified: Secondary | ICD-10-CM | POA: Diagnosis not present

## 2021-02-11 DIAGNOSIS — Z125 Encounter for screening for malignant neoplasm of prostate: Secondary | ICD-10-CM

## 2021-02-11 LAB — CBC WITH DIFFERENTIAL/PLATELET
Basophils Absolute: 0 10*3/uL (ref 0.0–0.1)
Basophils Relative: 0.3 % (ref 0.0–3.0)
Eosinophils Absolute: 0.1 10*3/uL (ref 0.0–0.7)
Eosinophils Relative: 1.8 % (ref 0.0–5.0)
HCT: 40.5 % (ref 39.0–52.0)
Hemoglobin: 13.8 g/dL (ref 13.0–17.0)
Lymphocytes Relative: 25 % (ref 12.0–46.0)
Lymphs Abs: 1.3 10*3/uL (ref 0.7–4.0)
MCHC: 34 g/dL (ref 30.0–36.0)
MCV: 87.3 fl (ref 78.0–100.0)
Monocytes Absolute: 0.4 10*3/uL (ref 0.1–1.0)
Monocytes Relative: 8 % (ref 3.0–12.0)
Neutro Abs: 3.4 10*3/uL (ref 1.4–7.7)
Neutrophils Relative %: 64.9 % (ref 43.0–77.0)
Platelets: 189 10*3/uL (ref 150.0–400.0)
RBC: 4.64 Mil/uL (ref 4.22–5.81)
RDW: 14.4 % (ref 11.5–15.5)
WBC: 5.3 10*3/uL (ref 4.0–10.5)

## 2021-02-11 LAB — COMPREHENSIVE METABOLIC PANEL
ALT: 64 U/L — ABNORMAL HIGH (ref 0–53)
AST: 38 U/L — ABNORMAL HIGH (ref 0–37)
Albumin: 4.1 g/dL (ref 3.5–5.2)
Alkaline Phosphatase: 63 U/L (ref 39–117)
BUN: 13 mg/dL (ref 6–23)
CO2: 25 mEq/L (ref 19–32)
Calcium: 8.9 mg/dL (ref 8.4–10.5)
Chloride: 105 mEq/L (ref 96–112)
Creatinine, Ser: 0.94 mg/dL (ref 0.40–1.50)
GFR: 89.02 mL/min (ref >60.00)
Glucose, Bld: 114 mg/dL — ABNORMAL HIGH (ref 70–99)
Potassium: 4.2 mEq/L (ref 3.5–5.1)
Sodium: 138 mEq/L (ref 135–145)
Total Bilirubin: 0.6 mg/dL (ref 0.2–1.2)
Total Protein: 6.3 g/dL (ref 6.0–8.3)

## 2021-02-11 LAB — HEMOGLOBIN A1C: Hgb A1c MFr Bld: 7 % — ABNORMAL HIGH (ref 4.6–6.5)

## 2021-02-11 LAB — PSA: PSA: 1.1 ng/mL (ref 0.10–4.00)

## 2021-02-11 LAB — TSH: TSH: 2.76 u[IU]/mL (ref 0.35–5.50)

## 2021-02-11 LAB — LIPID PANEL
Cholesterol: 132 mg/dL (ref 0–200)
HDL: 35 mg/dL — ABNORMAL LOW (ref >39.00)
LDL Cholesterol: 77 mg/dL (ref 0–99)
NonHDL: 96.64
Total CHOL/HDL Ratio: 4
Triglycerides: 97 mg/dL (ref 0.0–149.0)
VLDL: 19.4 mg/dL (ref 0.0–40.0)

## 2021-02-14 ENCOUNTER — Encounter: Payer: 59 | Admitting: Family Medicine

## 2021-02-15 ENCOUNTER — Other Ambulatory Visit: Payer: Self-pay

## 2021-02-16 ENCOUNTER — Ambulatory Visit (INDEPENDENT_AMBULATORY_CARE_PROVIDER_SITE_OTHER): Payer: 59 | Admitting: Family Medicine

## 2021-02-16 ENCOUNTER — Encounter: Payer: Self-pay | Admitting: Family Medicine

## 2021-02-16 VITALS — BP 136/88 | HR 55 | Temp 98.6°F | Ht 69.0 in | Wt 224.4 lb

## 2021-02-16 DIAGNOSIS — Z1211 Encounter for screening for malignant neoplasm of colon: Secondary | ICD-10-CM

## 2021-02-16 DIAGNOSIS — Z125 Encounter for screening for malignant neoplasm of prostate: Secondary | ICD-10-CM

## 2021-02-16 DIAGNOSIS — Z6833 Body mass index (BMI) 33.0-33.9, adult: Secondary | ICD-10-CM

## 2021-02-16 DIAGNOSIS — R7303 Prediabetes: Secondary | ICD-10-CM

## 2021-02-16 DIAGNOSIS — E7800 Pure hypercholesterolemia, unspecified: Secondary | ICD-10-CM | POA: Diagnosis not present

## 2021-02-16 DIAGNOSIS — Z0000 Encounter for general adult medical examination without abnormal findings: Secondary | ICD-10-CM

## 2021-02-16 DIAGNOSIS — N401 Enlarged prostate with lower urinary tract symptoms: Secondary | ICD-10-CM

## 2021-02-16 DIAGNOSIS — E6609 Other obesity due to excess calories: Secondary | ICD-10-CM

## 2021-02-16 DIAGNOSIS — I10 Essential (primary) hypertension: Secondary | ICD-10-CM | POA: Diagnosis not present

## 2021-02-16 DIAGNOSIS — N400 Enlarged prostate without lower urinary tract symptoms: Secondary | ICD-10-CM | POA: Insufficient documentation

## 2021-02-16 DIAGNOSIS — K7581 Nonalcoholic steatohepatitis (NASH): Secondary | ICD-10-CM

## 2021-02-16 MED ORDER — LOSARTAN POTASSIUM 100 MG PO TABS: 100.0000 mg | ORAL_TABLET | Freq: Every day | ORAL | 11 refills | Status: DC

## 2021-02-16 NOTE — Assessment & Plan Note (Signed)
bp in fair control at this time  BP Readings from Last 1 Encounters:  02/16/21 136/88   No changes needed Most recent labs reviewed  Disc lifstyle change with low sodium diet and exercise  Plan to continue losartan 100 mg daily

## 2021-02-16 NOTE — Assessment & Plan Note (Signed)
Discussed how this problem influences overall health and the risks it imposes  Reviewed plan for weight loss with lower calorie diet (via better food choices and also portion control or program like weight watchers) and exercise building up to or more than 30 minutes 5 days per week including some aerobic activity  Now in diabetic range -glucose

## 2021-02-16 NOTE — Assessment & Plan Note (Signed)
Lab Results  Component Value Date   PSA 1.10 02/11/2021   PSA 0.70 02/09/2020   PSA 0.78 01/31/2019    Under care of urology for BPH Doing well with flomax

## 2021-02-16 NOTE — Patient Instructions (Addendum)
Get a flu shot in the fall   Colonoscopy will be due in June 2023   For cholesterol Avoid red meat/ fried foods/ egg yolks/ fatty breakfast meats/ butter, cheese and high fat dairy/ and shellfish   HDL (good cholesterol) is down Exercise helps this   Randomly check blood sugars at different times  Your a1c is up into the diabetic range  Follow up in 3 months   Try to get most of your carbohydrates from produce (with the exception of white potatoes)  Eat less bread/pasta/rice/snack foods/cereals/sweets and other items from the middle of the grocery store (processed carbs)  Try to work up to 5 days per week of exercise at least 30 minutes

## 2021-02-16 NOTE — Assessment & Plan Note (Signed)
Disc goals for lipids and reasons to control them Rev last labs with pt Rev low sat fat diet in detail Enc exercise to help HDL If glucose stays in DM range will have to discuss statin

## 2021-02-16 NOTE — Progress Notes (Signed)
Subjective:    Patient ID: Terry Dixon, male    DOB: 12-03-1960, 60 y.o.   MRN: [id number]  This visit occurred during the SARS-CoV-2 public health emergency.  Safety protocols were in place, including screening questions prior to the visit, additional usage of staff PPE, and extensive cleaning of exam room while observing appropriate contact time as indicated for disinfecting solutions.   HPI Here for health maintenance exam and to review chronic medical problems    Wt Readings from Last 3 Encounters:  02/16/21 224 lb 7 oz (101.8 kg)  12/17/20 226 lb (102.5 kg)  02/10/20 (!) 220 lb 4 oz (99.9 kg)   33.14 kg/m  Had covid in June  Not immunized Feeling a lot better  Took molnupirvir  No wheezing    Zoster status- declines vaccine   Flu shot-fall Tdap 4/20   Eye exam -had eye exam 3 months ago   Colonoscopy 6/20 with 3 y recall  (5 polyps)  Had a positive cologuard   Prostate health  Lab Results  Component Value Date   PSA 1.10 02/11/2021   PSA 0.70 02/09/2020   PSA 0.78 01/31/2019  Takes flomax 0.4 mg daily-helps urinary frequency  Sees a urologist- Dr Eliberto Ivory - prostate exam in June (slt enlarged)   HTN bp is stable today  No cp or palpitations or headaches or edema  No side effects to medicines  BP Readings from Last 3 Encounters:  02/16/21 136/88  12/17/20 (!) 162/96  02/10/20 122/70     Losartan 100 mg daily  Pulse Readings from Last 3 Encounters:  02/16/21 (!) 55  12/17/20 (!) 55  02/10/20 53    Fatty liver  Lab Results  Component Value Date   ALT 64 (H) 02/11/2021   AST 38 (H) 02/11/2021   ALKPHOS 63 02/11/2021   BILITOT 0.6 02/11/2021  Improved from last time Did take otc meds for covid  Diet has been good  Gym- goes at least once per week and increasing that    Hyperlipidemia Lab Results  Component Value Date   CHOL 132 02/11/2021   CHOL 146 02/09/2020   CHOL 138 01/31/2019   Lab Results  Component Value Date   HDL 35.00 (L)  02/11/2021   HDL 41.30 02/09/2020   HDL 37.70 (L) 01/31/2019   Lab Results  Component Value Date   LDLCALC 77 02/11/2021   LDLCALC 79 02/09/2020   LDLCALC 72 01/31/2019   Lab Results  Component Value Date   TRIG 97.0 02/11/2021   TRIG 132.0 02/09/2020   TRIG 138.0 01/31/2019   Lab Results  Component Value Date   CHOLHDL 4 02/11/2021   CHOLHDL 4 02/09/2020   CHOLHDL 4 01/31/2019   Lab Results  Component Value Date   LDLDIRECT 115.6 11/17/2010   LDLDIRECT 121.8 07/17/2007   LDLDIRECT 100.5 01/03/2007   LDL is good  HDL is down  Makes the effort   Prediabetes Lab Results  Component Value Date   HGBA1C 7.0 (H) 02/11/2021   This is up from 6.2   Am blood sugar is around 124 Low 100s in the late afternoon During vacations 150s-170s   He had vacation and covid before that  1-2 weeks (at least) eating poorly    Am- egg/biscuit  Veggies for lunch  Dinner varies  Patient Active Problem List   Diagnosis Date Noted   BPH (benign prostatic hyperplasia) 02/16/2021   Occult blood positive stool 09/24/2018   Prediabetes 03/11/2016   Prostate  cancer screening 12/09/2014   Steatohepatitis, non-alcoholic 09/14/3141   Colon cancer screening 05/31/2012   Low serum testosterone level 11/28/2011   Routine general medical examination at a health care facility 11/23/2011   Family history of non-anemic vitamin B12 deficiency 05/22/2011   ERECTILE DYSFUNCTION 05/26/2008   Class 1 obesity due to excess calories with serious comorbidity and body mass index (BMI) of 33.0 to 33.9 in adult 01/03/2007   Hyperlipidemia 01/01/2007   Essential hypertension 01/01/2007   DIVERTICULOSIS, COLON 01/01/2007   Past Medical History:  Diagnosis Date   Asthma    Diverticulitis    colon    ED (erectile dysfunction)    Hyperlipidemia    Hypertension    Low testosterone    NASH (nonalcoholic steatohepatitis)    Obesity    Seasonal allergies    Past Surgical History:  Procedure  Laterality Date   HERNIA REPAIR  2000   R & L   VASECTOMY  1999   Social History   Tobacco Use   Smoking status: Former   Smokeless tobacco: Never   Tobacco comments:    age 31 for 1 year   Substance Use Topics   Alcohol use: No    Alcohol/week: 0.0 standard drinks   Drug use: No   Family History  Problem Relation Age of Onset   Diabetes Mother    COPD Mother    Benign prostatic hyperplasia Father    Hypertension Father    Breast cancer Sister 38       died at 67   Prostate cancer Maternal Grandfather    Colon cancer Neg Hx    Esophageal cancer Neg Hx    Liver disease Neg Hx    Stomach cancer Neg Hx    Pancreatic cancer Neg Hx    Allergies  Allergen Reactions   Dairy Aid [Lactase]    Viagra [Sildenafil Citrate]     Leg pain   Current Outpatient Medications on File Prior to Visit  Medication Sig Dispense Refill   albuterol (PROVENTIL HFA;VENTOLIN HFA) 108 (90 BASE) MCG/ACT inhaler Inhale 2 puffs into the lungs every 6 (six) hours as needed for wheezing or shortness of breath. 1 Inhaler 0   cetirizine (ZYRTEC) 10 MG tablet Take 10 mg by mouth daily as needed for allergies.     CIALIS 5 MG tablet TAKE 1 TABLET (5 MG TOTAL) BY MOUTH DAILY AS NEEDED FOR ERECTILE DYSFUNCTION. 10 tablet 5   EPIPEN 2-PAK 0.3 MG/0.3ML SOAJ injection See admin instructions. Reported on 07/06/2015-use as needed  11   fluticasone (FLONASE) 50 MCG/ACT nasal spray Place 2 sprays into both nostrils daily as needed.   19   Multiple Vitamin (DAILY MULTIVITAMIN PO) Take 1 tablet by mouth daily.     Omega-3 Fatty Acids (FISH OIL) 1200 MG CAPS Take by mouth daily.     ONETOUCH DELICA LANCETS 88I MISC Check blood sugar twice a day and as directed. Dx E11.9 100 each 3   ONETOUCH ULTRA test strip CHECK BLOOD SUGAR TWICE A DAY AND AS DIRECTED. DX E11.9 50 strip 11   promethazine-dextromethorphan (PROMETHAZINE-DM) 6.25-15 MG/5ML syrup Take 5 mLs by mouth 4 (four) times daily as needed. 118 mL 0   SYMBICORT  80-4.5 MCG/ACT inhaler daily as needed.     tamsulosin (FLOMAX) 0.4 MG CAPS capsule Take 0.4 mg by mouth.     No current facility-administered medications on file prior to visit.     Review of Systems  Constitutional:  Negative for  activity change, appetite change, fatigue, fever and unexpected weight change.  HENT:  Negative for congestion, rhinorrhea, sore throat and trouble swallowing.   Eyes:  Negative for pain, redness, itching and visual disturbance.  Respiratory:  Negative for cough, chest tightness, shortness of breath and wheezing.   Cardiovascular:  Negative for chest pain and palpitations.  Gastrointestinal:  Negative for abdominal pain, blood in stool, constipation, diarrhea and nausea.  Endocrine: Negative for cold intolerance, heat intolerance, polydipsia and polyuria.  Genitourinary:  Positive for frequency. Negative for difficulty urinating, dysuria and urgency.  Musculoskeletal:  Negative for arthralgias, joint swelling and myalgias.  Skin:  Negative for pallor and rash.  Neurological:  Negative for dizziness, tremors, weakness, numbness and headaches.  Hematological:  Negative for adenopathy. Does not bruise/bleed easily.  Psychiatric/Behavioral:  Negative for decreased concentration and dysphoric mood. The patient is not nervous/anxious.       Objective:   Physical Exam Constitutional:      General: He is not in acute distress.    Appearance: Normal appearance. He is well-developed. He is obese. He is not ill-appearing or diaphoretic.  HENT:     Head: Normocephalic and atraumatic.     Right Ear: Tympanic membrane, ear canal and external ear normal.     Left Ear: Tympanic membrane, ear canal and external ear normal.     Nose: Nose normal. No congestion.     Mouth/Throat:     Mouth: Mucous membranes are moist.     Pharynx: Oropharynx is clear. No posterior oropharyngeal erythema.  Eyes:     General: No scleral icterus.       Right eye: No discharge.        Left  eye: No discharge.     Conjunctiva/sclera: Conjunctivae normal.     Pupils: Pupils are equal, round, and reactive to light.  Neck:     Thyroid: No thyromegaly.     Vascular: No carotid bruit or JVD.  Cardiovascular:     Rate and Rhythm: Normal rate and regular rhythm.     Pulses: Normal pulses.     Heart sounds: Normal heart sounds.    No gallop.  Pulmonary:     Effort: Pulmonary effort is normal. No respiratory distress.     Breath sounds: Normal breath sounds. No wheezing or rales.     Comments: Good air exch Chest:     Chest wall: No tenderness.  Abdominal:     General: Bowel sounds are normal. There is no distension or abdominal bruit.     Palpations: Abdomen is soft. There is no mass.     Tenderness: There is no abdominal tenderness.     Hernia: No hernia is present.  Musculoskeletal:        General: No tenderness.     Cervical back: Normal range of motion and neck supple. No rigidity. No muscular tenderness.     Right lower leg: No edema.     Left lower leg: No edema.  Lymphadenopathy:     Cervical: No cervical adenopathy.  Skin:    General: Skin is warm and dry.     Coloration: Skin is not pale.     Findings: No erythema or rash.     Comments: Solar lentigines diffusely   Neurological:     Mental Status: He is alert.     Cranial Nerves: No cranial nerve deficit.     Sensory: No sensory deficit.     Motor: No abnormal muscle tone.  Coordination: Coordination normal.     Gait: Gait normal.     Deep Tendon Reflexes: Reflexes are normal and symmetric. Reflexes normal.  Psychiatric:        Mood and Affect: Mood normal.        Cognition and Memory: Cognition and memory normal.          Assessment & Plan:   Problem List Items Addressed This Visit       Cardiovascular and Mediastinum   Essential hypertension - Primary    bp in fair control at this time  BP Readings from Last 1 Encounters:  02/16/21 136/88  No changes needed Most recent labs reviewed   Disc lifstyle change with low sodium diet and exercise  Plan to continue losartan 100 mg daily       Relevant Medications   losartan (COZAAR) 100 MG tablet     Digestive   Steatohepatitis, non-alcoholic    AST and ALT are improved Enc to keep working on weight loss         Genitourinary   BPH (benign prostatic hyperplasia)    Sees urology  Symptomatic  flomax is helpful-plans to continue         Other   Hyperlipidemia    Disc goals for lipids and reasons to control them Rev last labs with pt Rev low sat fat diet in detail Enc exercise to help HDL If glucose stays in DM range will have to discuss statin        Relevant Medications   losartan (COZAAR) 100 MG tablet   Class 1 obesity due to excess calories with serious comorbidity and body mass index (BMI) of 33.0 to 33.9 in adult    Discussed how this problem influences overall health and the risks it imposes  Reviewed plan for weight loss with lower calorie diet (via better food choices and also portion control or program like weight watchers) and exercise building up to or more than 30 minutes 5 days per week including some aerobic activity  Now in diabetic range -glucose        Routine general medical examination at a health care facility    Reviewed health habits including diet and exercise and skin cancer prevention Reviewed appropriate screening tests for age  Also reviewed health mt list, fam hx and immunization status , as well as social and family history   See HPI Labs reviewed  Declines covid imm and has had covid Declines shingrix  Plans to get flu shot in the fall  Colonoscopy utd , due in 2023 psa is reassuring Enc weight loss for better health        Colon cancer screening    Colonoscopy 6/20 with 3 y recall due to polyps        Prostate cancer screening    Lab Results  Component Value Date   PSA 1.10 02/11/2021   PSA 0.70 02/09/2020   PSA 0.78 01/31/2019   Under care of urology for  BPH Doing well with flomax      Prediabetes    A1C is up into the diabetic range now  Lab Results  Component Value Date   HGBA1C 7.0 (H) 02/11/2021   Plans to work on diet and return in 3 mo for re check  Consider tx if not below 6.5  Disc imp of low glycemic diet and wt loss  Handouts given Enc him to check glucose at different times daily and keep a log of this and  eating

## 2021-02-16 NOTE — Assessment & Plan Note (Signed)
Sees urology  Symptomatic  flomax is helpful-plans to continue

## 2021-02-16 NOTE — Assessment & Plan Note (Signed)
A1C is up into the diabetic range now  Lab Results  Component Value Date   HGBA1C 7.0 (H) 02/11/2021   Plans to work on diet and return in 3 mo for re check  Consider tx if not below 6.5  Disc imp of low glycemic diet and wt loss  Handouts given Enc him to check glucose at different times daily and keep a log of this and eating

## 2021-02-16 NOTE — Assessment & Plan Note (Signed)
Reviewed health habits including diet and exercise and skin cancer prevention Reviewed appropriate screening tests for age  Also reviewed health mt list, fam hx and immunization status , as well as social and family history   See HPI Labs reviewed  Declines covid imm and has had covid Declines shingrix  Plans to get flu shot in the fall  Colonoscopy utd , due in 2023 psa is reassuring Enc weight loss for better health

## 2021-02-16 NOTE — Assessment & Plan Note (Signed)
AST and ALT are improved Enc to keep working on weight loss

## 2021-02-16 NOTE — Assessment & Plan Note (Signed)
Colonoscopy 6/20 with 3 y recall due to polyps

## 2021-03-07 ENCOUNTER — Other Ambulatory Visit: Payer: Self-pay | Admitting: Family Medicine

## 2021-05-25 ENCOUNTER — Ambulatory Visit: Payer: 59 | Admitting: Family Medicine

## 2021-06-03 ENCOUNTER — Other Ambulatory Visit: Payer: Self-pay

## 2021-06-03 ENCOUNTER — Ambulatory Visit: Payer: Self-pay | Attending: Internal Medicine

## 2021-06-03 DIAGNOSIS — Z23 Encounter for immunization: Secondary | ICD-10-CM

## 2021-06-03 NOTE — Progress Notes (Signed)
   Covid-19 Vaccination Clinic  Name:  Terry Dixon    MRN: [id number] DOB: [birth year]  06/03/2021  Terry Dixon was observed post Covid-19 immunization for 15 minutes without incident. He was provided with Vaccine Information Sheet and instruction to access the V-Safe system.   Terry Dixon was instructed to call 911 with any severe reactions post vaccine: Difficulty breathing  Swelling of face and throat  A fast heartbeat  A bad rash all over body  Dizziness and weakness   Immunizations Administered     Name Date Dose VIS Date Route   PFIZER Comrnaty(Gray TOP) Covid-19 Vaccine 06/03/2021 11:24 AM 0.3 mL 03/16/2021 Intramuscular   Manufacturer: [place]   Lot: [id number]   [place]: [place], PharmD, MBA Clinical Acute Care Pharmacist

## 2021-06-07 ENCOUNTER — Other Ambulatory Visit: Payer: Self-pay

## 2021-06-07 MED ORDER — PFIZER-BIONT COVID-19 VAC-TRIS 30 MCG/0.3ML IM SUSP
INTRAMUSCULAR | 0 refills | Status: DC
Start: 2021-06-03 — End: 2021-06-24
  Filled 2021-06-07: qty 0.3, 1d supply, fill #0

## 2021-06-24 ENCOUNTER — Ambulatory Visit: Payer: Self-pay | Attending: Internal Medicine

## 2021-06-24 ENCOUNTER — Other Ambulatory Visit: Payer: Self-pay

## 2021-06-24 DIAGNOSIS — Z23 Encounter for immunization: Secondary | ICD-10-CM

## 2021-06-24 MED ORDER — PFIZER-BIONT COVID-19 VAC-TRIS 30 MCG/0.3ML IM SUSP
INTRAMUSCULAR | 0 refills | Status: DC
  Filled 2021-06-24: qty 0.3, 1d supply, fill #0

## 2021-06-24 NOTE — Progress Notes (Signed)
   Covid-19 Vaccination Clinic  Name:  LASON EVELAND    MRN: [id number] DOB: 04-11-1961  06/24/2021  Mr. Hochstetler was observed post Covid-19 immunization for 15 minutes without incident. He was provided with Vaccine Information Sheet and instruction to access the V-Safe system.   Mr. Rode was instructed to call 911 with any severe reactions post vaccine: Difficulty breathing  Swelling of face and throat  A fast heartbeat  A bad rash all over body  Dizziness and weakness   Immunizations Administered     Name Date Dose VIS Date Route   PFIZER Comrnaty(Gray TOP) Covid-19 Vaccine 06/24/2021  9:04 AM 0.3 mL 03/16/2021 Intramuscular   Manufacturer: [place]   Lot: [id number]   [place]: [place], PharmD, MBA Clinical Acute Care Pharmacist

## 2021-12-07 ENCOUNTER — Emergency Department
Admission: EM | Admit: 2021-12-07 | Discharge: 2021-12-07 | Disposition: A | Discharge status: Home / Self Care | Payer: Self-pay | Attending: Emergency Medicine | Admitting: Emergency Medicine

## 2021-12-07 ENCOUNTER — Encounter: Payer: Self-pay | Admitting: Emergency Medicine

## 2021-12-07 ENCOUNTER — Other Ambulatory Visit: Payer: Self-pay

## 2021-12-07 ENCOUNTER — Emergency Department: Payer: Self-pay

## 2021-12-07 DIAGNOSIS — R310 Gross hematuria: Secondary | ICD-10-CM

## 2021-12-07 DIAGNOSIS — N132 Hydronephrosis with renal and ureteral calculous obstruction: Secondary | ICD-10-CM | POA: Insufficient documentation

## 2021-12-07 DIAGNOSIS — N200 Calculus of kidney: Secondary | ICD-10-CM

## 2021-12-07 LAB — URINALYSIS, ROUTINE W REFLEX MICROSCOPIC
Bilirubin Urine: NEGATIVE
Glucose, UA: NEGATIVE mg/dL
Ketones, ur: NEGATIVE mg/dL
Leukocytes,Ua: NEGATIVE
Nitrite: NEGATIVE
Protein, ur: NEGATIVE mg/dL
Specific Gravity, Urine: 1.002 — ABNORMAL LOW (ref 1.005–1.030)
pH: 6 (ref 5.0–8.0)

## 2021-12-07 LAB — CBC
HCT: 46.5 % (ref 39.0–52.0)
Hemoglobin: 15.5 g/dL (ref 13.0–17.0)
MCH: 29.4 pg (ref 26.0–34.0)
MCHC: 33.3 g/dL (ref 30.0–36.0)
MCV: 88.2 fL (ref 80.0–100.0)
Platelets: 213 10*3/uL (ref 150–400)
RBC: 5.27 MIL/uL (ref 4.22–5.81)
RDW: 12.9 % (ref 11.5–15.5)
WBC: 8.3 10*3/uL (ref 4.0–10.5)
nRBC: 0 % (ref 0.0–0.2)

## 2021-12-07 LAB — BASIC METABOLIC PANEL
Anion gap: 9 (ref 5–15)
BUN: 17 mg/dL (ref 6–20)
CO2: 22 mmol/L (ref 22–32)
Calcium: 9.1 mg/dL (ref 8.9–10.3)
Chloride: 106 mmol/L (ref 98–111)
Creatinine, Ser: 1.13 mg/dL (ref 0.61–1.24)
GFR, Estimated: >60 mL/min (ref >60)
Glucose, Bld: 128 mg/dL — ABNORMAL HIGH (ref 70–99)
Potassium: 3.7 mmol/L (ref 3.5–5.1)
Sodium: 137 mmol/L (ref 135–145)

## 2021-12-07 NOTE — ED Triage Notes (Signed)
Pt to ED from home c/o hematuria starting approx 2 hours ago.  States some burning with urination and lower right back pain today.  Denies blood thinner or hx of kidney stones.  Denies n/v/d or fevers as well.

## 2021-12-07 NOTE — ED Notes (Signed)
Pt discharge information reviewed. Pt understands need for follow up care and when to return if symptoms worsen. All questions answered. Pt is alert and oriented with even and regular respirations. Pt is seen ambulating out of department with string steady gait.

## 2021-12-07 NOTE — ED Provider Notes (Signed)
[facility] Provider Note   Event Date/Time   First MD Initiated Contact with Patient 12/07/21 1945     (approximate) History  Hematuria  HPI Terry Dixon is a 61 y.o. male with a stated past medical history of hyperlipidemia, obesity, diverticulosis who presents for gross hematuria that he noticed this morning.  Patient states that this hematuria has somewhat resolved at this point however he was still concerned and wanted to be checked out.  Patient currently denies any vision changes, tinnitus, difficulty speaking, facial droop, sore throat, chest pain, shortness of breath, abdominal pain, nausea/vomiting/diarrhea, dysuria, or weakness/numbness/paresthesias in any extremity Physical Exam  Triage Vital Signs: ED Triage Vitals  Enc Vitals Group     BP 12/07/21 1917 (!) 158/79     Pulse Rate 12/07/21 1917 65     Resp 12/07/21 1917 16     Temp 12/07/21 1917 98.6 F (37 C)     Temp Source 12/07/21 1917 Oral     SpO2 12/07/21 1917 99 %     Weight 12/07/21 1918 220 lb (99.8 kg)     Height 12/07/21 1918 5' 9"  (1.753 m)     Head Circumference --      Peak Flow --      Pain Score 12/07/21 1917 1     Pain Loc --      Pain Edu? --      Excl. in [place]? --    Most recent vital signs: Vitals:   12/07/21 1917  BP: (!) 158/79  Pulse: 65  Resp: 16  Temp: 98.6 F (37 C)  SpO2: 99%   General: Awake, oriented x4. CV:  Good peripheral perfusion.  Resp:  Normal effort.  Abd:  No distention.  Other:  Obese middle-aged Caucasian male sitting in chair in no distress ED Results / Procedures / Treatments  Labs (all labs ordered are listed, but only abnormal results are displayed) Labs Reviewed  URINALYSIS, ROUTINE W REFLEX MICROSCOPIC - Abnormal; Notable for the following components:      Result Value   Color, Urine STRAW (*)    APPearance CLEAR (*)    Specific Gravity, Urine 1.002 (*)    Hgb urine dipstick LARGE (*)    Bacteria, UA RARE (*)    All other  components within normal limits  BASIC METABOLIC PANEL - Abnormal; Notable for the following components:   Glucose, Bld 128 (*)    All other components within normal limits  CBC  RADIOLOGY ED MD interpretation: CT renal stone study shows mild right hydronephrosis secondary to a 3 mm stone at the right UPJ as well as multiple left kidney stones and hydronephrosis on the left secondary to a large 13 mm collecting system stone -Agree with radiology assessment Official radiology report(s): CT Renal Stone Study  Result Date: 12/07/2021 CLINICAL DATA:  Hematuria EXAM: CT ABDOMEN AND PELVIS WITHOUT CONTRAST TECHNIQUE: Multidetector CT imaging of the abdomen and pelvis was performed following the standard protocol without IV contrast. RADIATION DOSE REDUCTION: This exam was performed according to the departmental dose-optimization program which includes automated exposure control, adjustment of the mA and/or kV according to patient size and/or use of iterative reconstruction technique. COMPARISON:  CT 11/11/2004 FINDINGS: Lower chest: Lung bases are clear. Hepatobiliary: Hepatic steatosis. No calcified gallstone. No biliary dilatation Pancreas: Unremarkable. No pancreatic ductal dilatation or surrounding inflammatory changes. Spleen: Normal in size without focal abnormality. Adrenals/Urinary Tract: Adrenal glands are normal. Mild upper pole hydronephrosis on the left,  secondary to a large 13 mm stone in the upper pole collecting system. There are multiple additional stones in the left kidney. Minimal right hydronephrosis, secondary to a 3 mm stone at the right UPJ. The bladder is unremarkable Stomach/Bowel: Stomach is within normal limits. Appendix appears normal. No evidence of bowel wall thickening, distention, or inflammatory changes. Vascular/Lymphatic: Mild atherosclerosis. No aneurysm. No suspicious lymph nodes Reproductive: Prostate is unremarkable. Other: Negative for pelvic effusion or free air. Small  fat containing umbilical hernia Musculoskeletal: No acute or significant osseous findings. IMPRESSION: 1. Mild right hydronephrosis, secondary to a 3 mm stone at the right UPJ 2. Multiple left kidney stones. Mild upper pole hydronephrosis secondary to a large 13 mm collecting system stone. 3. Hepatic steatosis Electronically Signed   By: Donavan Foil M.D.   On: 12/07/2021 19:48   PROCEDURES: Critical Care performed: No Procedures MEDICATIONS ORDERED IN ED: Medications - No data to display IMPRESSION / MDM / [place] / ED COURSE  I reviewed the triage vital signs and the nursing notes.                              Patient presents for severe flank pain. Presentation most consistent with Renal Colic from a Non-infected Kidney Stone. Given History and Exam I have lower suspicion for atypical appendicitis, genital torsion, acute cholecystitis, AAA, Aortic Dissection, Serious Bacterial Illness or other emergent intraabdominal pathology.  Workup: CBC, BMP, CT Abd/Pelvis noncontrast, UA, reassess Findings: 3 mm right UPJ stone, 13 mm left collecting system stone Reassesment: Patient tolerating PO and pain controlled Patient states that he does have a local urologist, Dr. Eliberto Ivory who has seen him for her enlarged prostate in the past and feels comfortable following up with him tomorrow Disposition:  Discharge. Strict return precautions for infected stone or PO intolerance discussed.    FINAL CLINICAL IMPRESSION(S) / ED DIAGNOSES   Final diagnoses:  Gross hematuria  Kidney stones   Rx / DC Orders   ED Discharge Orders     None      Note:  This document was prepared using Dragon voice recognition software and may include unintentional dictation errors.   Naaman Plummer, MD 12/07/21 2134

## 2021-12-08 ENCOUNTER — Encounter: Payer: Self-pay | Admitting: Emergency Medicine

## 2021-12-08 ENCOUNTER — Emergency Department
Admission: EM | Admit: 2021-12-08 | Discharge: 2021-12-08 | Disposition: A | Discharge status: Home / Self Care | Payer: No Typology Code available for payment source | Attending: Emergency Medicine | Admitting: Emergency Medicine

## 2021-12-08 ENCOUNTER — Other Ambulatory Visit: Payer: Self-pay

## 2021-12-08 ENCOUNTER — Telehealth: Payer: Self-pay

## 2021-12-08 DIAGNOSIS — J45909 Unspecified asthma, uncomplicated: Secondary | ICD-10-CM | POA: Diagnosis not present

## 2021-12-08 DIAGNOSIS — R112 Nausea with vomiting, unspecified: Secondary | ICD-10-CM | POA: Diagnosis not present

## 2021-12-08 DIAGNOSIS — Z79899 Other long term (current) drug therapy: Secondary | ICD-10-CM | POA: Insufficient documentation

## 2021-12-08 DIAGNOSIS — I10 Essential (primary) hypertension: Secondary | ICD-10-CM | POA: Diagnosis not present

## 2021-12-08 DIAGNOSIS — R61 Generalized hyperhidrosis: Secondary | ICD-10-CM | POA: Diagnosis not present

## 2021-12-08 DIAGNOSIS — N200 Calculus of kidney: Secondary | ICD-10-CM | POA: Insufficient documentation

## 2021-12-08 DIAGNOSIS — R109 Unspecified abdominal pain: Secondary | ICD-10-CM | POA: Diagnosis present

## 2021-12-08 MED ORDER — IBUPROFEN 800 MG PO TABS: 800.0000 mg | ORAL_TABLET | Freq: Three times a day (TID) | ORAL | 0 refills | Status: DC | PRN

## 2021-12-08 MED ORDER — SODIUM CHLORIDE 0.9 % IV BOLUS (SEPSIS)
1000.0000 mL | Freq: Once | INTRAVENOUS | Status: AC
  Administered 2021-12-08: 1000 mL via INTRAVENOUS

## 2021-12-08 MED ORDER — KETOROLAC TROMETHAMINE 30 MG/ML IJ SOLN
30.0000 mg | Freq: Once | INTRAMUSCULAR | Status: AC
  Administered 2021-12-08: 30 mg via INTRAVENOUS
  Filled 2021-12-08: qty 1

## 2021-12-08 MED ORDER — HYDROMORPHONE HCL 1 MG/ML IJ SOLN
1.0000 mg | Freq: Once | INTRAMUSCULAR | Status: AC
  Administered 2021-12-08: 1 mg via INTRAVENOUS
  Filled 2021-12-08: qty 1

## 2021-12-08 MED ORDER — TAMSULOSIN HCL 0.4 MG PO CAPS: 0.4000 mg | ORAL_CAPSULE | Freq: Every day | ORAL | 0 refills | Status: DC

## 2021-12-08 MED ORDER — ONDANSETRON HCL 4 MG/2ML IJ SOLN
4.0000 mg | Freq: Once | INTRAMUSCULAR | Status: AC
  Administered 2021-12-08: 4 mg via INTRAVENOUS
  Filled 2021-12-08: qty 2

## 2021-12-08 MED ORDER — OXYCODONE-ACETAMINOPHEN 5-325 MG PO TABS: 2.0000 | ORAL_TABLET | Freq: Four times a day (QID) | ORAL | 0 refills | Status: DC | PRN

## 2021-12-08 MED ORDER — ONDANSETRON 4 MG PO TBDP: 4.0000 mg | ORAL_TABLET | Freq: Four times a day (QID) | ORAL | 0 refills | Status: DC | PRN

## 2021-12-08 MED ORDER — MORPHINE SULFATE (PF) 4 MG/ML IV SOLN
4.0000 mg | Freq: Once | INTRAVENOUS | Status: AC
  Administered 2021-12-08: 4 mg via INTRAVENOUS
  Filled 2021-12-08: qty 1

## 2021-12-08 NOTE — ED Notes (Signed)
Pt placed on 2L [place], sats dropped to 89% on RA while at rest after receiving dilaudid.

## 2021-12-08 NOTE — Telephone Encounter (Signed)
Per chart review tab pt seen [facility] ED on 12/07/21 and 12/08/21. Sending note to Dr Glori Bickers and [place] CMA.

## 2021-12-08 NOTE — ED Notes (Signed)
Dr. Leonides Schanz at the bedside

## 2021-12-08 NOTE — ED Provider Notes (Signed)
[facility] Provider Note    Event Date/Time   First MD Initiated Contact with Patient 12/08/21 [phone]     (approximate)   History   Flank Pain   HPI  Terry Dixon is a 61 y.o. male with history of hypertension, hyperlipidemia, obesity who presents to the emergency department with right flank pain, nausea and vomiting, sweating.  He was seen in the emergency department yesterday evening for gross hematuria and was diagnosed with 3 mm ureterolithiasis at the UPJ.  Labs reassuring at that time and urine showed no sign of infection.  He denies any fevers.  States he does not have any pain medication at home.  He has an appointment to see his urologist Dr. Eliberto Ivory at 8 AM.   History provided by patient.    Past Medical History:  Diagnosis Date   Asthma    Diverticulitis    colon    ED (erectile dysfunction)    Hyperlipidemia    Hypertension    Low testosterone    NASH (nonalcoholic steatohepatitis)    Obesity    Seasonal allergies     Past Surgical History:  Procedure Laterality Date   HERNIA REPAIR  2000   R & L   VASECTOMY  1999    MEDICATIONS:  Prior to Admission medications   Medication Sig Start Date End Date Taking? Authorizing Provider  albuterol (PROVENTIL HFA;VENTOLIN HFA) 108 (90 BASE) MCG/ACT inhaler Inhale 2 puffs into the lungs every 6 (six) hours as needed for wheezing or shortness of breath. 12/18/13   Jearld Fenton, NP  cetirizine (ZYRTEC) 10 MG tablet Take 10 mg by mouth daily as needed for allergies.    [provider]  CIALIS 5 MG tablet TAKE 1 TABLET (5 MG TOTAL) BY MOUTH DAILY AS NEEDED FOR ERECTILE DYSFUNCTION. 04/03/18   Tower, Wynelle Fanny, MD  COVID-19 mRNA Vac-TriS, Pfizer, (PFIZER-BIONT COVID-19 VAC-TRIS) SUSP injection Inject into the muscle. 06/24/21   Carlyle Basques, MD  EPIPEN 2-PAK 0.3 MG/0.3ML SOAJ injection See admin instructions. Reported on 07/06/2015-use as needed 07/13/14   [provider]   fluticasone (FLONASE) 50 MCG/ACT nasal spray Place 2 sprays into both nostrils daily as needed.  06/09/14   [provider]  losartan (COZAAR) 100 MG tablet Take 1 tablet (100 mg total) by mouth daily. 02/16/21   Tower, Wynelle Fanny, MD  Multiple Vitamin (DAILY MULTIVITAMIN PO) Take 1 tablet by mouth daily.    [provider]  Omega-3 Fatty Acids (FISH OIL) 1200 MG CAPS Take by mouth daily.    [provider]  [facility] DELICA LANCETS 29F MISC Check blood sugar twice a day and as directed. Dx E11.9 09/12/16   Tower, Wynelle Fanny, MD  ONETOUCH ULTRA test strip CHECK BLOOD SUGAR TWICE A DAY AND AS DIRECTED. DX E11.9 03/07/21   Tower, Wynelle Fanny, MD  promethazine-dextromethorphan (PROMETHAZINE-DM) 6.25-15 MG/5ML syrup Take 5 mLs by mouth 4 (four) times daily as needed. 01/04/21   Ann Held, DO  SYMBICORT 80-4.5 MCG/ACT inhaler daily as needed. 10/31/17   [provider]  tamsulosin (FLOMAX) 0.4 MG CAPS capsule Take 0.4 mg by mouth.    [provider]    Physical Exam   Triage Vital Signs: ED Triage Vitals  Enc Vitals Group     BP 12/08/21 0227 (!) 174/94     Pulse Rate 12/08/21 0227 72     Resp 12/08/21 0227 18     Temp 12/08/21 0227  97.7 F (36.5 C)     Temp Source 12/08/21 0227 Oral     SpO2 12/08/21 0227 97 %     Weight 12/08/21 0224 220 lb 0.3 oz (99.8 kg)     Height 12/08/21 0224 5' 9"  (1.753 m)     Head Circumference --      Peak Flow --      Pain Score 12/08/21 0224 7     Pain Loc --      Pain Edu? --      Excl. in [place]? --     Most recent vital signs: Vitals:   12/08/21 0400 12/08/21 0432  BP: (!) 158/89   Pulse: (!) 58 (!) 55  Resp: 14 16  Temp:  97.9 F (36.6 C)  SpO2: 95% 98%    CONSTITUTIONAL: Alert and oriented and responds appropriately to questions.  Appears uncomfortable, diaphoretic HEAD: Normocephalic, atraumatic EYES: Conjunctivae clear, pupils appear equal, sclera nonicteric ENT: normal nose; moist mucous  membranes NECK: Supple, normal ROM CARD: RRR; S1 and S2 appreciated; no murmurs, no clicks, no rubs, no gallops RESP: Normal chest excursion without splinting or tachypnea; breath sounds clear and equal bilaterally; no wheezes, no rhonchi, no rales, no hypoxia or respiratory distress, speaking full sentences ABD/GI: Normal bowel sounds; non-distended; soft, non-tender, no rebound, no guarding, no peritoneal signs BACK: The back appears normal EXT: Normal ROM in all joints; no deformity noted, no edema; no cyanosis SKIN: Normal color for age and race; warm; no rash on exposed skin NEURO: Moves all extremities equally, normal speech PSYCH: The patient's mood and manner are appropriate.   ED Results / Procedures / Treatments   LABS: (all labs ordered are listed, but only abnormal results are displayed) Labs Reviewed - No data to display   EKG:   RADIOLOGY: My personal review and interpretation of imaging: CT from yesterday evening shows 3 mm stone at the right UPJ with mild right hydronephrosis.  I have personally reviewed all radiology reports.   CT Renal Stone Study  Result Date: 12/07/2021 CLINICAL DATA:  Hematuria EXAM: CT ABDOMEN AND PELVIS WITHOUT CONTRAST TECHNIQUE: Multidetector CT imaging of the abdomen and pelvis was performed following the standard protocol without IV contrast. RADIATION DOSE REDUCTION: This exam was performed according to the departmental dose-optimization program which includes automated exposure control, adjustment of the mA and/or kV according to patient size and/or use of iterative reconstruction technique. COMPARISON:  CT 11/11/2004 FINDINGS: Lower chest: Lung bases are clear. Hepatobiliary: Hepatic steatosis. No calcified gallstone. No biliary dilatation Pancreas: Unremarkable. No pancreatic ductal dilatation or surrounding inflammatory changes. Spleen: Normal in size without focal abnormality. Adrenals/Urinary Tract: Adrenal glands are normal. Mild upper  pole hydronephrosis on the left, secondary to a large 13 mm stone in the upper pole collecting system. There are multiple additional stones in the left kidney. Minimal right hydronephrosis, secondary to a 3 mm stone at the right UPJ. The bladder is unremarkable Stomach/Bowel: Stomach is within normal limits. Appendix appears normal. No evidence of bowel wall thickening, distention, or inflammatory changes. Vascular/Lymphatic: Mild atherosclerosis. No aneurysm. No suspicious lymph nodes Reproductive: Prostate is unremarkable. Other: Negative for pelvic effusion or free air. Small fat containing umbilical hernia Musculoskeletal: No acute or significant osseous findings. IMPRESSION: 1. Mild right hydronephrosis, secondary to a 3 mm stone at the right UPJ 2. Multiple left kidney stones. Mild upper pole hydronephrosis secondary to a large 13 mm collecting system stone. 3. Hepatic steatosis Electronically Signed   By: Maudie Mercury  Francoise Ceo M.D.   On: 12/07/2021 19:48     PROCEDURES:  Critical Care performed: No     Procedures    IMPRESSION / MDM / ASSESSMENT AND PLAN / ED COURSE  I reviewed the triage vital signs and the nursing notes.    Patient here with right-sided flank pain with known right 3 mm UPJ stone on CT scan earlier today.     DIFFERENTIAL DIAGNOSIS (includes but not limited to):   Kidney stone, doubt UTI, pyelonephritis, appendicitis, diverticulitis, colitis   PLAN: Patient just had blood work and urine done earlier today.  I do not feel this needs to be repeated.  CBC showed no leukocytosis.  Normal creatinine.  Urine showed no sign of infection.  Will give IV fluids, Toradol, morphine and Zofran for symptomatic relief.  I reviewed and interpreted his CT scan from earlier today which shows a 3 mm stone at the right UPJ with mild hydronephrosis.  There is no need for repeat imaging currently.   MEDICATIONS GIVEN IN ED: Medications  ketorolac (TORADOL) 30 MG/ML injection 30 mg (30 mg  Intravenous Given 12/08/21 0309)  morphine (PF) 4 MG/ML injection 4 mg (4 mg Intravenous Given 12/08/21 0310)  ondansetron (ZOFRAN) injection 4 mg (4 mg Intravenous Given 12/08/21 0309)  sodium chloride 0.9 % bolus 1,000 mL (0 mLs Intravenous Stopped 12/08/21 0421)  HYDROmorphone (DILAUDID) injection 1 mg (1 mg Intravenous Given 12/08/21 0357)     ED COURSE: No significant relief after morphine and Toradol.  Will give Dilaudid and reassess.   Patient reports pain is about a 1/10 after Dilaudid.  He has someone to drive him home.  He has urology follow-up in the morning.  Will discharge with prescriptions of pain medication, nausea medicine and Flomax.   At this time, I do not feel there is any life-threatening condition present. I reviewed all nursing notes, vitals, pertinent previous records.  All lab and urine results, EKGs, imaging ordered have been independently reviewed and interpreted by myself.  I reviewed all available radiology reports from any imaging ordered this visit.  Based on my assessment, I feel the patient is safe to be discharged home without further emergent workup and can continue workup as an outpatient as needed. Discussed all findings, treatment plan as well as usual and customary return precautions with patient.  They verbalize understanding and are comfortable with this plan.  Outpatient follow-up has been provided as needed.  All questions have been answered.    CONSULTS: No emergent urologic consult needed at this time given pain is well controlled without signs of infection.   OUTSIDE RECORDS REVIEWED: Reviewed patient's last office visit with Dr. Glori Bickers on 02/16/2021.         FINAL CLINICAL IMPRESSION(S) / ED DIAGNOSES   Final diagnoses:  Right kidney stone     Rx / DC Orders   ED Discharge Orders          Ordered    oxyCODONE-acetaminophen (PERCOCET) 5-325 MG tablet  Every 6 hours PRN        12/08/21 0329    ondansetron (ZOFRAN-ODT) 4 MG  disintegrating tablet  Every 6 hours PRN        12/08/21 0329    ibuprofen (ADVIL) 800 MG tablet  Every 8 hours PRN        12/08/21 0329    tamsulosin (FLOMAX) 0.4 MG CAPS capsule  Daily        12/08/21 0329  Note:  This document was prepared using Dragon voice recognition software and may include unintentional dictation errors.   Letta Cargile, Delice Bison, DO 12/08/21 [phone]

## 2021-12-08 NOTE — Telephone Encounter (Signed)
[facility] - Client TELEPHONE ADVICE RECORD [facility]urse Patient Name: Terry Dixon Gender: Male DOB: 1960/10/22 Age: 61 Y 80 M 10 D Return Phone Number: [phone] (Primary), [phone] (Secondary) Address: City/ State/ Zip: [place]  [address] Client [facility] - Client Client Site Deal Provider Glori Bickers, Roque Lias - MD Contact Type Call Who Is Calling Patient / Member / Family / Caregiver Call Type Triage / Clinical Relationship To Patient Self Return Phone Number [phone] (Primary) Chief Complaint Urine - unusual color Reason for Call Symptomatic / Request for [place] states his urine is dark red and would like to schedule an appointment. [place] Not Listed  UC in [place] Translation No Nurse Assessment Nurse: Rose Phi, RN, [place] Date/Time (Eastern Time): 12/07/2021 6:09:44 PM Confirm and document reason for call. If symptomatic, describe symptoms. ---Caller states his urine is dark red and would like to schedule an appointment. Looked like kool-aid an hour ago and is now a dark red after 2 cups of water. Burning sensation when peeing. Lower back around beltline is tender. Does the patient have any new or worsening symptoms? ---Yes Will a triage be completed? ---Yes Related visit to physician within the last 2 weeks? ---No Does the PT have any chronic conditions? (i.e. diabetes, asthma, this includes High risk factors for pregnancy, etc.) ---Yes List chronic conditions. ---hypertension, enlarged prostate Is this a behavioral health or substance abuse call? ---No Guidelines Guideline Title Affirmed Question Affirmed Notes Nurse Date/Time (Eastern Time) Urine - Blood In [1] Pain or burning with passing urine AND [2] side (flank) or back pain present Rose Phi, RN, [facility] 12/07/2021 6:14:21 PM Disp.  Time Eilene Ghazi Time) Disposition Final User PLEASE NOTE: All timestamps contained within this report are represented as [place] Standard Time. CONFIDENTIALTY NOTICE: This fax transmission is intended only for the addressee. It contains information that is legally privileged, confidential or otherwise protected from use or disclosure. If you are not the intended recipient, you are strictly prohibited from reviewing, disclosing, copying using or disseminating any of this information or taking any action in reliance on or regarding this information. If you have received this fax in error, please notify us immediately by telephone so that we can arrange for its return to [place]. Phone: [phone], Toll-Free: [phone], Fax: [phone] Page: 2 of 2 Call Id: [id number] 12/07/2021 6:18:31 PM See HCP within 4 Hours (or PCP triage) Yes [place], RN, [place] Caller Disagree/Comply Comply Caller Understands Yes PreDisposition Call Doctor Care Advice Given Per Guideline SEE HCP (OR PCP TRIAGE) WITHIN 4 HOURS: * IF OFFICE WILL BE CLOSED AND NO PCP (PRIMARY CARE PROVIDER) SECOND-LEVEL TRIAGE: You need to be seen within the next 3 or 4 hours. A nearby Urgent Care Center [facility]) is often a good source of care. Another choice is to go to the ED. Go sooner if you become worse. PAIN MEDICINES: * For pain relief, you can take either acetaminophen, ibuprofen, or naproxen. * They are over-the-counter (OTC) pain drugs. You can buy them at the drugstore. CALL BACK IF: * You become worse CARE ADVICE given per Urine, Blood In (Adult) guideline. Referrals GO TO FACILITY OTHER - SPECIF

## 2021-12-08 NOTE — Discharge Instructions (Addendum)
Please follow-up with your urologist as scheduled at 8 AM.  You are being provided a prescription for opiates (also known as narcotics) for pain control.  Opiates can be addictive and should only be used when absolutely necessary for pain control when other alternatives do not work.  We recommend you only use them for the recommended amount of time and only as prescribed.  Please do not take with other sedative medications or alcohol.  Please do not drive, operate machinery, make important decisions while taking opiates.  Please note that these medications can be addictive and have high abuse potential.  Patients can become addicted to narcotics after only taking them for a few days.  Please keep these medications locked away from children, teenagers or any family members with history of substance abuse.  Narcotic pain medicine may also make you constipated.  You may use over-the-counter medications such as MiraLAX, Colace to prevent constipation.  If you become constipated, you may use over-the-counter enemas as needed.  Itching and nausea are also common side effects of narcotic pain medication.  If you develop uncontrolled vomiting or a rash, please stop these medications and seek medical care.

## 2021-12-08 NOTE — ED Triage Notes (Signed)
Patient ambulatory to triage with steady gait, without difficulty or distress noted; pt was seen earlier for kidney stones; st return of rt flank at 1am radiating into abd and groin; st has no pain meds at home

## 2021-12-08 NOTE — Telephone Encounter (Signed)
Seen in ER and had kidney stone, has urology f/u

## 2021-12-16 ENCOUNTER — Encounter: Payer: Self-pay | Admitting: Family Medicine

## 2021-12-22 ENCOUNTER — Encounter: Payer: Self-pay | Admitting: Urology

## 2021-12-22 ENCOUNTER — Other Ambulatory Visit: Payer: Self-pay

## 2021-12-22 ENCOUNTER — Ambulatory Visit
Admission: RE | Admit: 2021-12-22 | Discharge: 2021-12-22 | Disposition: A | Discharge status: Home / Self Care | Payer: No Typology Code available for payment source | Attending: Urology | Admitting: Urology

## 2021-12-22 ENCOUNTER — Telehealth: Payer: Self-pay | Admitting: Family Medicine

## 2021-12-22 ENCOUNTER — Encounter
Admission: RE | Disposition: A | Discharge status: Home / Self Care | Payer: Self-pay | Source: Home / Self Care | Attending: Urology

## 2021-12-22 DIAGNOSIS — I10 Essential (primary) hypertension: Secondary | ICD-10-CM | POA: Diagnosis not present

## 2021-12-22 DIAGNOSIS — N200 Calculus of kidney: Secondary | ICD-10-CM | POA: Diagnosis present

## 2021-12-22 HISTORY — PX: EXTRACORPOREAL SHOCK WAVE LITHOTRIPSY: SHX1557

## 2021-12-22 SURGERY — LITHOTRIPSY, ESWL
Anesthesia: Moderate Sedation | Laterality: Left

## 2021-12-22 MED ORDER — DIPHENHYDRAMINE HCL 25 MG PO CAPS
ORAL_CAPSULE | ORAL | Status: AC
  Administered 2021-12-22: 25 mg via ORAL
  Filled 2021-12-22: qty 1

## 2021-12-22 MED ORDER — MIDAZOLAM HCL 2 MG/2ML IJ SOLN: 1.0000 mg | Freq: Once | INTRAMUSCULAR | Status: AC

## 2021-12-22 MED ORDER — FUROSEMIDE 10 MG/ML IJ SOLN
INTRAMUSCULAR | Status: AC
Start: 2021-12-22 — End: 2021-12-22
  Administered 2021-12-22: 10 mg via INTRAVENOUS
  Filled 2021-12-22: qty 2

## 2021-12-22 MED ORDER — PROMETHAZINE HCL 25 MG/ML IJ SOLN: 25.0000 mg | Freq: Once | INTRAMUSCULAR | Status: AC

## 2021-12-22 MED ORDER — CIPROFLOXACIN HCL 500 MG PO TABS: 500.0000 mg | ORAL_TABLET | Freq: Two times a day (BID) | ORAL | 0 refills | Status: DC

## 2021-12-22 MED ORDER — FUROSEMIDE 10 MG/ML IJ SOLN: 10.0000 mg | Freq: Once | INTRAMUSCULAR | Status: AC

## 2021-12-22 MED ORDER — SODIUM CHLORIDE 0.9 % IV SOLN
2.0000 g | Freq: Once | INTRAVENOUS | Status: AC
  Administered 2021-12-22: 2 g via INTRAVENOUS
  Filled 2021-12-22: qty 2

## 2021-12-22 MED ORDER — SODIUM CHLORIDE 0.9 % IV SOLN: 1.0000 g | Freq: Once | INTRAVENOUS | Status: DC

## 2021-12-22 MED ORDER — MORPHINE SULFATE (PF) 10 MG/ML IV SOLN: 10.0000 mg | Freq: Once | INTRAVENOUS | Status: AC

## 2021-12-22 MED ORDER — PROMETHAZINE HCL 25 MG/ML IJ SOLN
INTRAMUSCULAR | Status: AC
  Administered 2021-12-22: 25 mg via INTRAMUSCULAR
  Filled 2021-12-22: qty 1

## 2021-12-22 MED ORDER — ACETAMINOPHEN-CODEINE 300-30 MG PO TABS: 1.0000 | ORAL_TABLET | ORAL | 1 refills | Status: DC | PRN

## 2021-12-22 MED ORDER — DIPHENHYDRAMINE HCL 25 MG PO CAPS: 25.0000 mg | ORAL_CAPSULE | Freq: Once | ORAL | Status: AC

## 2021-12-22 MED ORDER — MORPHINE SULFATE (PF) 10 MG/ML IV SOLN
INTRAVENOUS | Status: AC
  Administered 2021-12-22: 10 mg via INTRAMUSCULAR
  Filled 2021-12-22: qty 1

## 2021-12-22 MED ORDER — MIDAZOLAM HCL 2 MG/2ML IJ SOLN
INTRAMUSCULAR | Status: AC
  Administered 2021-12-22: 1 mg via INTRAMUSCULAR
  Filled 2021-12-22: qty 2

## 2021-12-22 MED ORDER — DEXTROSE-NACL 5-0.45 % IV SOLN: INTRAVENOUS | Status: DC

## 2021-12-22 NOTE — Discharge Instructions (Addendum)
Lithotripsy, Care After This sheet gives you information about how to care for yourself after your procedure. Your health care provider may also give you more specific instructions. If you have problems or questions, contact your health care provider. What can I expect after the procedure? After the procedure, it is common to have: Some blood in your urine. This should only last for a few days. Soreness in your back, sides, or upper abdomen for a few days. Blotches or bruises on the area where the shock wave entered the skin. Pain, discomfort, or nausea when pieces (fragments) of the kidney stone move through the tube that carries urine from the kidney to the bladder (ureter). Stone fragments may pass soon after the procedure, but they may continue to pass for up to 4-8 weeks. If you have severe pain or nausea, contact your health care provider. This may be caused by a large stone that was not broken up, and this may mean that you need more treatment. Some pain or discomfort during urination. Some pain or discomfort in the lower abdomen or (in men) at the base of the penis. Follow these instructions at home: Medicines Take over-the-counter and prescription medicines only as told by your health care provider. If you were prescribed an antibiotic medicine, take it as told by your health care provider. Do not stop taking the antibiotic even if you start to feel better. Ask your health care provider if the medicine prescribed to you requires you to avoid driving or using machinery. Eating and drinking A comparison of three sample cups showing dark yellow, yellow, and pale yellow urine.     A plate with examples of foods in a healthy diet.   Drink enough fluid to keep your urine pale yellow. This helps any remaining pieces of the stone to pass. It can also help prevent new stones from forming. Eat plenty of fresh fruits and vegetables. Follow instructions from your health care provider about  eating or drinking restrictions. You may be instructed to: Reduce how much salt (sodium) you eat or drink. Check ingredients and nutrition facts on packaged foods and beverages to see how much sodium they contain. Reduce how much meat you eat. Eat the recommended amount of calcium for your age and gender. Ask your health care provider how much calcium you should have. General instructions Get plenty of rest. Return to your normal activities as told by your health care provider. Ask your health care provider what activities are safe for you. Most people can resume normal activities 1-2 days after the procedure. If you were given a sedative during the procedure, it can affect you for several hours. Do not drive or operate machinery until your health care provider says that it is safe. Your health care provider may direct you to lie in a certain position (postural drainage) and tap firmly (percuss) over your kidney area to help stone fragments pass. Follow instructions as told by your health care provider. If directed, strain all urine through the strainer that was provided by your health care provider. Keep all fragments for your health care provider to see. Any stones that are found may be sent to a medical lab for examination. The stone may be as small as a grain of salt. Keep all follow-up visits as told by your health care provider. This is important. Contact a health care provider if: You have a fever or chills. You have nausea that is severe or does not go away. You have any  of these urinary symptoms: Blood in your urine for longer than your health care provider told you to expect. Urine that smells bad or unusual. Feeling a strong urge to urinate after emptying your bladder. Pain or burning with urination that does not go away. Urinating more often than usual and this does not go away. You have a stent and it comes out. Get help right away if: You have severe pain in your back, sides, or  upper abdomen. You have any of these urinary symptoms: Severe pain while urinating. More blood in your urine or having blood in your urine when you did not before. Passing blood clots in your urine. Passing only a small amount of urine or being unable to pass any urine at all. You have severe nausea that leads to persistent vomiting. You faint. Summary After this procedure, it is common to have some pain, discomfort, or nausea when pieces (fragments) of the kidney stone move through the tube that carries urine from the kidney to the bladder (ureter). If this pain or nausea is severe, however, you should contact your health care provider. Return to your normal activities as told by your health care provider. Ask your health care provider what activities are safe for you. Drink enough fluid to keep your urine pale yellow. This helps any remaining pieces of the stone to pass, and it can help prevent new stones from forming. If directed, strain your urine and keep all fragments for your health care provider to see. Fragments or stones may be as small as a grain of salt. Get help right away if you have severe pain in your back, sides, or upper abdomen, or if you have severe pain while urinating. This information is not intended to replace advice given to you by your health care provider. Make sure you discuss any questions you have with your health care provider. Document Revised: 05/30/2021 Document Reviewed: 03/07/2021 Elsevier Patient Education  [place]   The drugs that you were given will stay in your system until tomorrow so for the next 24 hours you should not:  Drive an automobile Make any legal decisions Drink any alcoholic beverage   You may resume regular meals tomorrow.  Today it is better to start with liquids and gradually work up to solid foods.  You may eat anything you prefer, but it is better to start with liquids,  then soup and crackers, and gradually work up to solid foods.   Please notify your doctor immediately if you have any unusual bleeding, trouble breathing, redness and pain at the surgery site, drainage, fever, or pain not relieved by medication.    Additional Instructions:     Please contact your physician with any problems or Same Day Surgery at [phone], Monday through Friday 6 am to 4 pm, or [place] at [facility] number at [phone].

## 2021-12-23 ENCOUNTER — Encounter: Payer: Self-pay | Admitting: Urology

## 2021-12-28 NOTE — Telephone Encounter (Signed)
Refill request Losartan Last office visit 02/16/21 Patient has cancelled several appointments Last refill 02/16/21 #30/11 Pharmacy is requesting a 90 day supply

## 2021-12-28 NOTE — Telephone Encounter (Signed)
I refilled once for 90 Needs a f/u appt

## 2021-12-28 NOTE — Telephone Encounter (Signed)
Lvm for pt to call back and schedule.

## 2021-12-28 NOTE — Telephone Encounter (Signed)
Please call patient and schedule appointment per Dr. Glori Bickers.

## 2021-12-29 NOTE — Telephone Encounter (Signed)
Scheduled 02/17/22 for CPE.

## 2022-02-03 ENCOUNTER — Encounter: Payer: Self-pay | Admitting: Internal Medicine

## 2022-02-17 ENCOUNTER — Encounter: Payer: No Typology Code available for payment source | Admitting: Family Medicine

## 2022-03-24 ENCOUNTER — Other Ambulatory Visit: Payer: Self-pay | Admitting: Family Medicine

## 2022-03-24 NOTE — Telephone Encounter (Signed)
Pt has cancelled last 2 CPE, needs new CPE (labs prior) scheduled before we can refill med. Please schedule appt then route back to me to refill med. Thanks

## 2022-03-24 NOTE — Telephone Encounter (Signed)
Patient stated that he canceled in the past due to not having insurance. He will have insurance as of October 9,so I scheduled him for after then.I scheduled his labs for same day as well,since he will not have insurance prior to.

## 2022-03-24 NOTE — Telephone Encounter (Signed)
Med filled.  

## 2022-04-05 ENCOUNTER — Ambulatory Visit
Admission: EM | Admit: 2022-04-05 | Discharge: 2022-04-05 | Disposition: A | Discharge status: Home / Self Care | Payer: No Typology Code available for payment source | Attending: Urgent Care | Admitting: Urgent Care

## 2022-04-05 DIAGNOSIS — J4541 Moderate persistent asthma with (acute) exacerbation: Secondary | ICD-10-CM

## 2022-04-05 MED ORDER — PREDNISONE 50 MG PO TABS: 50.0000 mg | ORAL_TABLET | Freq: Every day | ORAL | 0 refills | Status: AC

## 2022-04-05 NOTE — Discharge Instructions (Addendum)
Follow up here or with your primary care provider if your symptoms do not improve with treatment.  Follow-up with your primary care provider for evaluation for asthma.

## 2022-04-05 NOTE — ED Provider Notes (Signed)
[facility]    CSN: [id number] Arrival date & time: 04/05/22  1702      History   Chief Complaint Chief Complaint  Patient presents with   Sore Throat   Cough    HPI Terry Dixon is a 61 y.o. male.   HPI  Presents to UC with symptoms starting last night.  Endorses constant cough  Reports bad allergies "all my life". Mowed his yard yesterday. Using flonase, mucinex, albuterol inhaler (prescribed for "borderline asthma").    Past Medical History:  Diagnosis Date   Asthma    Diverticulitis    colon    ED (erectile dysfunction)    Hyperlipidemia    Hypertension    Low testosterone    NASH (nonalcoholic steatohepatitis)    Obesity    Seasonal allergies     Patient Active Problem List   Diagnosis Date Noted   BPH (benign prostatic hyperplasia) 02/16/2021   Occult blood positive stool 09/24/2018   Prediabetes 03/11/2016   Prostate cancer screening 12/09/2014   Steatohepatitis, non-alcoholic 40/02/6760   Colon cancer screening 05/31/2012   Low serum testosterone level 11/28/2011   Routine general medical examination at a health care facility 11/23/2011   Family history of non-anemic vitamin B12 deficiency 05/22/2011   ERECTILE DYSFUNCTION 05/26/2008   Class 1 obesity due to excess calories with serious comorbidity and body mass index (BMI) of 33.0 to 33.9 in adult 01/03/2007   Hyperlipidemia 01/01/2007   Essential hypertension 01/01/2007   DIVERTICULOSIS, COLON 01/01/2007    Past Surgical History:  Procedure Laterality Date   EXTRACORPOREAL SHOCK WAVE LITHOTRIPSY Left 12/22/2021   Procedure: EXTRACORPOREAL SHOCK WAVE LITHOTRIPSY (ESWL);  Surgeon: Royston Cowper, MD;  Location: [facility] ORS;  Service: Urology;  Laterality: Left;   HERNIA REPAIR  2000   R & L   VASECTOMY  1999       Home Medications    Prior to Admission medications   Medication Sig Start Date End Date Taking? Authorizing Provider  acetaminophen-codeine (TYLENOL #3) 300-30  MG tablet Take 1-2 tablets by mouth every 4 (four) hours as needed for moderate pain. 12/22/21   Royston Cowper, MD  albuterol (PROVENTIL HFA;VENTOLIN HFA) 108 (90 BASE) MCG/ACT inhaler Inhale 2 puffs into the lungs every 6 (six) hours as needed for wheezing or shortness of breath. 12/18/13   Jearld Fenton, NP  cetirizine (ZYRTEC) 10 MG tablet Take 10 mg by mouth daily as needed for allergies.    [provider]  CIALIS 5 MG tablet TAKE 1 TABLET (5 MG TOTAL) BY MOUTH DAILY AS NEEDED FOR ERECTILE DYSFUNCTION. 04/03/18   Tower, Wynelle Fanny, MD  ciprofloxacin (CIPRO) 500 MG tablet Take 1 tablet (500 mg total) by mouth 2 (two) times daily. 12/22/21   Royston Cowper, MD  COVID-19 mRNA Vac-TriS, Pfizer, (PFIZER-BIONT COVID-19 VAC-TRIS) SUSP injection Inject into the muscle. 06/24/21   Carlyle Basques, MD  EPIPEN 2-PAK 0.3 MG/0.3ML SOAJ injection See admin instructions. Reported on 07/06/2015-use as needed 07/13/14   [provider]  fluticasone (FLONASE) 50 MCG/ACT nasal spray Place 2 sprays into both nostrils daily as needed.  06/09/14   [provider]  ibuprofen (ADVIL) 800 MG tablet Take 1 tablet (800 mg total) by mouth every 8 (eight) hours as needed for mild pain. 12/08/21   Ward, Delice Bison, DO  losartan (COZAAR) 100 MG tablet TAKE 1 TABLET BY MOUTH EVERY DAY 03/24/22   Tower, Wynelle Fanny, MD  Multiple Vitamin (DAILY MULTIVITAMIN  PO) Take 1 tablet by mouth daily.    [provider]  Omega-3 Fatty Acids (FISH OIL) 1200 MG CAPS Take by mouth daily.    [provider]  ondansetron (ZOFRAN-ODT) 4 MG disintegrating tablet Take 1 tablet (4 mg total) by mouth every 6 (six) hours as needed for nausea or vomiting. 12/08/21   Ward, Delice Bison, DO  ONETOUCH DELICA LANCETS 17G MISC Check blood sugar twice a day and as directed. Dx E11.9 09/12/16   Tower, Wynelle Fanny, MD  ONETOUCH ULTRA test strip CHECK BLOOD SUGAR TWICE A DAY AND AS DIRECTED. DX E11.9 03/07/21   Tower, Wynelle Fanny, MD   oxyCODONE-acetaminophen (PERCOCET) 5-325 MG tablet Take 2 tablets by mouth every 6 (six) hours as needed for severe pain. 12/08/21 12/08/22  Ward, Delice Bison, DO  promethazine-dextromethorphan (PROMETHAZINE-DM) 6.25-15 MG/5ML syrup Take 5 mLs by mouth 4 (four) times daily as needed. 01/04/21   Ann Held, DO  SYMBICORT 80-4.5 MCG/ACT inhaler daily as needed. 10/31/17   [provider]  tamsulosin (FLOMAX) 0.4 MG CAPS capsule Take 0.4 mg by mouth.    [provider]  tamsulosin (FLOMAX) 0.4 MG CAPS capsule Take 1 capsule (0.4 mg total) by mouth daily. 12/08/21   Ward, Delice Bison, DO    Family History Family History  Problem Relation Age of Onset   Diabetes Mother    COPD Mother    Benign prostatic hyperplasia Father    Hypertension Father    Breast cancer Sister 46       died at 56   Prostate cancer Maternal Grandfather    Colon cancer Neg Hx    Esophageal cancer Neg Hx    Liver disease Neg Hx    Stomach cancer Neg Hx    Pancreatic cancer Neg Hx     Social History Social History   Tobacco Use   Smoking status: Former   Smokeless tobacco: Never   Tobacco comments:    age 71 for 1 year   Vaping Use   Vaping Use: Never used  Substance Use Topics   Alcohol use: No    Alcohol/week: 0.0 standard drinks of alcohol   Drug use: No     Allergies   Dairy aid [tilactase] and Viagra [sildenafil citrate]   Review of Systems Review of Systems   Physical Exam Triage Vital Signs ED Triage Vitals  Enc Vitals Group     BP 04/05/22 1748 (!) 157/75     Pulse Rate 04/05/22 1748 76     Resp 04/05/22 1748 18     Temp 04/05/22 1748 98.1 F (36.7 C)     Temp Source 04/05/22 1748 Oral     SpO2 04/05/22 1748 95 %     Weight --      Height --      Head Circumference --      Peak Flow --      Pain Score 04/05/22 1800 2     Pain Loc --      Pain Edu? --      Excl. in [place]? --    No data found.  Updated Vital Signs BP (!) 157/75 (BP Location: Right Wrist)    Pulse 76   Temp 98.1 F (36.7 C) (Oral)   Resp 18   SpO2 95%   Visual Acuity Right Eye Distance:   Left Eye Distance:   Bilateral Distance:    Right Eye Near:   Left Eye Near:    Bilateral  Near:     Physical Exam   UC Treatments / Results  Labs (all labs ordered are listed, but only abnormal results are displayed) Labs Reviewed - No data to display  EKG   Radiology No results found.  Procedures Procedures (including critical care time)  Medications Ordered in UC Medications - No data to display  Initial Impression / Assessment and Plan / UC Course  I have reviewed the triage vital signs and the nursing notes.  Pertinent labs & imaging results that were available during my care of the patient were reviewed by me and considered in my medical decision making (see chart for details).   Suspect underlying reactive airway disease exacerbated by cutting his lawn yesterday.  Will order corticosteroid to calm his symptoms.  Recommended follow-up with his primary care provider for evaluation for asthma.   Final Clinical Impressions(s) / UC Diagnoses   Final diagnoses:  None   Discharge Instructions   None    ED Prescriptions   None    PDMP not reviewed this encounter.   Rose Phi, [place] 04/05/22 1814

## 2022-04-05 NOTE — ED Triage Notes (Signed)
Pt. State since last night he has been having a constant cough and sore throat. Pt. Has been taking OTC medications, prescribed Flonase and his inhaler w/ no relief.

## 2022-04-25 ENCOUNTER — Encounter: Payer: No Typology Code available for payment source | Admitting: Family Medicine

## 2022-05-11 ENCOUNTER — Encounter: Payer: Self-pay | Admitting: Urology

## 2022-05-11 ENCOUNTER — Encounter
Admission: RE | Disposition: A | Discharge status: Home / Self Care | Payer: Self-pay | Source: Home / Self Care | Attending: Urology

## 2022-05-11 ENCOUNTER — Ambulatory Visit
Admission: RE | Admit: 2022-05-11 | Discharge: 2022-05-11 | Disposition: A | Discharge status: Home / Self Care | Payer: 59 | Attending: Urology | Admitting: Urology

## 2022-05-11 DIAGNOSIS — E669 Obesity, unspecified: Secondary | ICD-10-CM | POA: Diagnosis not present

## 2022-05-11 DIAGNOSIS — J45909 Unspecified asthma, uncomplicated: Secondary | ICD-10-CM | POA: Insufficient documentation

## 2022-05-11 DIAGNOSIS — Z6833 Body mass index (BMI) 33.0-33.9, adult: Secondary | ICD-10-CM | POA: Diagnosis not present

## 2022-05-11 DIAGNOSIS — I10 Essential (primary) hypertension: Secondary | ICD-10-CM | POA: Diagnosis not present

## 2022-05-11 DIAGNOSIS — N200 Calculus of kidney: Secondary | ICD-10-CM | POA: Diagnosis present

## 2022-05-11 DIAGNOSIS — G4730 Sleep apnea, unspecified: Secondary | ICD-10-CM | POA: Diagnosis not present

## 2022-05-11 HISTORY — PX: EXTRACORPOREAL SHOCK WAVE LITHOTRIPSY: SHX1557

## 2022-05-11 SURGERY — LITHOTRIPSY, ESWL
Anesthesia: Moderate Sedation | Laterality: Left

## 2022-05-11 MED ORDER — MORPHINE SULFATE (PF) 10 MG/ML IV SOLN: 10.0000 mg | INTRAVENOUS | Status: AC

## 2022-05-11 MED ORDER — LEVOFLOXACIN 500 MG PO TABS: 500.0000 mg | ORAL_TABLET | Freq: Every day | ORAL | Status: DC

## 2022-05-11 MED ORDER — DEXTROSE-NACL 5-0.45 % IV SOLN: INTRAVENOUS | Status: DC

## 2022-05-11 MED ORDER — LEVOFLOXACIN 500 MG PO TABS
ORAL_TABLET | ORAL | Status: AC
  Administered 2022-05-11: 500 mg via ORAL
  Filled 2022-05-11: qty 1

## 2022-05-11 MED ORDER — LEVOFLOXACIN 500 MG PO TABS: 500.0000 mg | ORAL_TABLET | Freq: Once | ORAL | Status: AC

## 2022-05-11 MED ORDER — FUROSEMIDE 10 MG/ML IJ SOLN
INTRAMUSCULAR | Status: AC
  Filled 2022-05-11: qty 2

## 2022-05-11 MED ORDER — MIDAZOLAM HCL 2 MG/2ML IJ SOLN: 1.0000 mg | INTRAMUSCULAR | Status: AC

## 2022-05-11 MED ORDER — DIPHENHYDRAMINE HCL 25 MG PO CAPS: 25.0000 mg | ORAL_CAPSULE | ORAL | Status: AC

## 2022-05-11 MED ORDER — CIPROFLOXACIN HCL 500 MG PO TABS: 500.0000 mg | ORAL_TABLET | Freq: Two times a day (BID) | ORAL | 0 refills | Status: DC

## 2022-05-11 MED ORDER — PROMETHAZINE HCL 25 MG/ML IJ SOLN
INTRAMUSCULAR | Status: AC
  Administered 2022-05-11: 25 mg via INTRAMUSCULAR
  Filled 2022-05-11: qty 1

## 2022-05-11 MED ORDER — MORPHINE SULFATE (PF) 10 MG/ML IV SOLN
INTRAVENOUS | Status: AC
  Administered 2022-05-11: 10 mg via INTRAMUSCULAR
  Filled 2022-05-11: qty 1

## 2022-05-11 MED ORDER — FUROSEMIDE 10 MG/ML IJ SOLN
10.0000 mg | Freq: Once | INTRAMUSCULAR | Status: AC
  Administered 2022-05-11: 10 mg via INTRAVENOUS

## 2022-05-11 MED ORDER — DIPHENHYDRAMINE HCL 25 MG PO CAPS
ORAL_CAPSULE | ORAL | Status: AC
  Administered 2022-05-11: 25 mg via ORAL
  Filled 2022-05-11: qty 1

## 2022-05-11 MED ORDER — MIDAZOLAM HCL 2 MG/2ML IJ SOLN
INTRAMUSCULAR | Status: AC
  Administered 2022-05-11: 1 mg via INTRAMUSCULAR
  Filled 2022-05-11: qty 2

## 2022-05-11 MED ORDER — PROMETHAZINE HCL 25 MG/ML IJ SOLN: 25.0000 mg | INTRAMUSCULAR | Status: AC

## 2022-05-11 MED ORDER — TAMSULOSIN HCL 0.4 MG PO CAPS: 0.4000 mg | ORAL_CAPSULE | Freq: Every day | ORAL | 11 refills | Status: DC

## 2022-05-11 NOTE — Discharge Instructions (Signed)
Kidney Stones A body outline of the urinary tract with a close-up of a kidney showing kidney stones.   Kidney stones are solid, rock-like deposits that form inside of the kidneys. The kidneys are a pair of organs that make urine. A kidney stone may form in a kidney and move into other parts of the urinary tract, including the tubes that connect the kidneys to the bladder (ureters), the bladder, and the tube that carries urine out of the body (urethra). As the stone moves through these areas, it can cause intense pain and block the flow of urine. Kidney stones are created when high levels of certain minerals are found in the urine. The stones are usually passed out of the body through urination, but in some cases, medical treatment may be needed to remove them. What are the causes? Kidney stones may be caused by: A condition in which certain glands produce too much parathyroid hormone (primary hyperparathyroidism), which causes too much calcium buildup in the blood. A buildup of uric acid crystals in the bladder (hyperuricosuria). Uric acid is a chemical that the body produces when you eat certain foods. It usually leaves the body in the urine. Narrowing (stricture) of one or both of the ureters. A kidney blockage that is present at birth (congenital obstruction). Past surgery on the kidney or the ureters. What increases the risk? The following factors may make you more likely to develop this condition: Having had a kidney stone in the past. Having a family history of kidney stones. Not drinking enough water. Eating a diet that is high in protein, salt (sodium), or sugar. Being overweight or obese. What are the signs or symptoms? Symptoms of a kidney stone may include: Pain in the side of the abdomen, right below the ribs (flank pain). Pain usually spreads (radiates) to the groin. Needing to urinate often or urgently. Painful urination. Blood in the urine  (hematuria). Nausea. Vomiting. Fever and chills. How is this diagnosed? This condition may be diagnosed based on: Your symptoms and medical history. A physical exam. Blood tests. Urine tests. These may be done before and after the stone passes out of your body through urination. Imaging tests, such as a CT scan, abdominal X-ray, or ultrasound. A procedure to examine the inside of the bladder (cystoscopy). How is this treated? Treatment for kidney stones depends on the size, location, and makeup of the stones. Kidney stones will often pass out of the body through urination. You may need to: Increase your fluid intake to help pass the stone. In some cases, you may be given fluids through an IV and may need to be monitored in the hospital. Take medicine for pain. Make changes in your diet to help prevent kidney stones from coming back. Sometimes, procedures are needed to remove a kidney stone. This may involve: A procedure to break up kidney stones using: A focused beam of light (laser therapy). Shock waves (extracorporeal shock wave lithotripsy). Surgery to remove kidney stones. This may be needed if you have severe pain or have stones that block your urinary tract. Follow these instructions at home: Medicines Take over-the-counter and prescription medicines only as told by your health care provider. Ask your health care provider if the medicine prescribed to you requires you to avoid driving or using heavy machinery. Eating and drinking Drink enough fluid to keep your urine pale yellow. You may be instructed to drink at least 8-10 glasses of water each day. This will help you pass the kidney stone.  If directed, change your diet. This may include: Limiting how much sodium you eat. Eating more fruits and vegetables. Limiting how much animal protein you eat. Animal proteins include red meat, poultry, fish, and eggs. Eating a normal amount of calcium (1,000-1,300 mg per day). Follow  instructions from your health care provider about eating or drinking restrictions. General instructions Collect urine samples as told by your health care provider. You may need to collect a urine sample: 24 hours after you pass the stone. 8-12 weeks after you pass the kidney stone, and every 6-12 months after that. Strain your urine every time you urinate, for as long as directed. Use the strainer that your health care provider recommends. Do not throw out the kidney stone after passing it. Keep the stone so it can be tested by your health care provider. Testing the makeup of your kidney stone may help prevent you from getting kidney stones in the future. Keep all follow-up visits. You may need follow-up X-rays or ultrasounds to make sure that your stone has passed. How is this prevented? A comparison of three sample cups showing dark yellow, yellow, and pale yellow urine.   To prevent another kidney stone: Drink enough fluid to keep your urine pale yellow. This is the best way to prevent kidney stones. Eat a healthy diet. Follow recommendations from your health care provider about foods to avoid. Recommendations vary depending on the type of kidney stone that you have. You may be instructed to eat a low-protein diet. Maintain a healthy weight. Where to find more information [place] ([facility]): www.kidney.[facility]): www.urologyhealth.org Contact a health care provider if: You have pain that gets worse or does not get better with medicine. Get help right away if: You have a fever or chills. You develop severe pain. You develop new abdominal pain. You faint. You are unable to urinate. Summary Kidney stones are solid, rock-like deposits that form inside of the kidneys. Kidney stones can cause nausea, vomiting, blood in the urine, abdominal pain, and the urge to urinate often. Treatment for kidney stones depends on the size, location, and makeup of the  stones. Kidney stones will often pass out of the body through urination. Kidney stones can be prevented by drinking enough fluids, eating a healthy diet, and maintaining a healthy weight. This information is not intended to replace advice given to you by your health care provider. Make sure you discuss any questions you have with your health care provider. Document Revised: 10/12/2021 Document Reviewed: 10/12/2021 Elsevier Patient Education  [place].     ESWL for Kidney Stones, Care After The following information offers guidance on how to care for yourself after your procedure. Your health care provider may also give you more specific instructions. If you have problems or questions, contact your health care provider. What can I expect after the procedure? After the procedure, it is common to have: Some blood in your urine. This should only last for a few days. Soreness in your back, sides, or upper abdomen for a few days. Blotches or bruises on the area where the shock wave entered the skin. Pain, discomfort, or nausea when pieces (fragments) of the kidney stone move through the tube that carries urine from the kidney to the bladder (ureter). Fragments may pass soon after the procedure. They may also take up to 4-8 weeks to pass. If you have severe pain or nausea, contact your health care provider. This may be caused by a  large stone that was not broken up enough. This may mean that you need more treatment. Some pain or discomfort during urination. Some pain or discomfort in the lower abdomen or at the base of the penis. Follow these instructions at home: Medicines A prescription pill bottle with an example of a pill.   Take over-the-counter and prescription medicines only as told by your health care provider. If you were prescribed antibiotics, take them as told by your health care provider. Do not stop using the antibiotic even if you start to feel better. Ask your health care  provider if the medicine prescribed to you: Requires you to avoid driving or using machinery. Can cause constipation. You may need to take these actions to prevent or treat constipation: Take over-the-counter or prescription medicines. Eat foods that are high in fiber, such as beans, whole grains, and fresh fruits and vegetables. Limit foods that are high in fat and processed sugars, such as fried or sweet foods. Eating and drinking Outline of a person drinking a glass of water.   Follow instructions from your health care provider about what you may eat and drink. You may be told to: Reduce how much salt (sodium) you eat or drink. Check ingredients and nutrition facts on packaged foods and drinks to see how much sodium they contain. Reduce how much meat you eat. Drink enough fluid to keep your urine pale yellow. This can help you pass any pieces of the stone that are left. It can also prevent new stones from forming. Eat plenty of fresh fruits and vegetables. Eat the recommended amount of calcium for your age and gender. Ask your health care provider how much calcium you should have. Activity Get plenty of rest as told by your health care provider. Avoid sitting for a long time without moving. Get up to take short walks every 1-2 hours. This is important to improve blood flow and breathing. Ask for help if you feel weak or unsteady. Your health care provider may tell you to lie in a certain position (postural drainage) and tap firmly (percuss) over your kidney area to help stone fragments pass. Follow instructions as told by your health care provider. Return to your normal activities as told by your health care provider. Ask your health care provider what activities are safe for you. Most people can resume normal activities 1-2 days after the procedure. General instructions If told, strain all urine through the strainer that was provided by your health care provider. Keep all fragments for  your health care provider to see. Any stones that are found may be sent to a medical lab for examination. The stone may be as small as a grain of salt. Keep all follow-up visits. This is important if you had a stent placed because it may need to stay in place for a few weeks. Ask your health care provider when the stent will be removed. Contact a health care provider if: You have a fever or chills. You have severe nausea that leads to persistent vomiting. You have any of these urinary symptoms: Increased blood or blood clots in the urine. Urine that smells bad or unusual. A strong urge to urinate after emptying your bladder. Pain or burning with urination that does not go away. A continued need to urinate more often than usual. You have a stent, and it comes out. Get help right away if: You have severe pain in your back, sides, or upper abdomen. You faint. You have any of  these urinary symptoms: Severe pain while urinating. More blood in your urine, or blood in your urine when you did not have any before. Blood clots in your urine larger than 1 inch (2.5 cm) in size. You pass only a small amount of urine when you urinate or are unable to pass any urine. This information is not intended to replace advice given to you by your health care provider. Make sure you discuss any questions you have with your health care provider. Document Revised: 11/03/2021 Document Reviewed: 11/03/2021 Elsevier Patient Education  [place].     ESWL for Kidney Stones, Care After The following information offers guidance on how to care for yourself after your procedure. Your health care provider may also give you more specific instructions. If you have problems or questions, contact your health care provider. What can I expect after the procedure? After the procedure, it is common to have: Some blood in your urine. This should only last for a few days. Soreness in your back, sides, or upper abdomen for  a few days. Blotches or bruises on the area where the shock wave entered the skin. Pain, discomfort, or nausea when pieces (fragments) of the kidney stone move through the tube that carries urine from the kidney to the bladder (ureter). Fragments may pass soon after the procedure. They may also take up to 4-8 weeks to pass. If you have severe pain or nausea, contact your health care provider. This may be caused by a large stone that was not broken up enough. This may mean that you need more treatment. Some pain or discomfort during urination. Some pain or discomfort in the lower abdomen or at the base of the penis. Follow these instructions at home: Medicines A prescription pill bottle with an example of a pill.   Take over-the-counter and prescription medicines only as told by your health care provider. If you were prescribed antibiotics, take them as told by your health care provider. Do not stop using the antibiotic even if you start to feel better. Ask your health care provider if the medicine prescribed to you: Requires you to avoid driving or using machinery. Can cause constipation. You may need to take these actions to prevent or treat constipation: Take over-the-counter or prescription medicines. Eat foods that are high in fiber, such as beans, whole grains, and fresh fruits and vegetables. Limit foods that are high in fat and processed sugars, such as fried or sweet foods. Eating and drinking Outline of a person drinking a glass of water.   Follow instructions from your health care provider about what you may eat and drink. You may be told to: Reduce how much salt (sodium) you eat or drink. Check ingredients and nutrition facts on packaged foods and drinks to see how much sodium they contain. Reduce how much meat you eat. Drink enough fluid to keep your urine pale yellow. This can help you pass any pieces of the stone that are left. It can also prevent new stones from forming. Eat  plenty of fresh fruits and vegetables. Eat the recommended amount of calcium for your age and gender. Ask your health care provider how much calcium you should have. Activity Get plenty of rest as told by your health care provider. Avoid sitting for a long time without moving. Get up to take short walks every 1-2 hours. This is important to improve blood flow and breathing. Ask for help if you feel weak or unsteady. Your health care provider  may tell you to lie in a certain position (postural drainage) and tap firmly (percuss) over your kidney area to help stone fragments pass. Follow instructions as told by your health care provider. Return to your normal activities as told by your health care provider. Ask your health care provider what activities are safe for you. Most people can resume normal activities 1-2 days after the procedure. General instructions If told, strain all urine through the strainer that was provided by your health care provider. Keep all fragments for your health care provider to see. Any stones that are found may be sent to a medical lab for examination. The stone may be as small as a grain of salt. Keep all follow-up visits. This is important if you had a stent placed because it may need to stay in place for a few weeks. Ask your health care provider when the stent will be removed. Contact a health care provider if: You have a fever or chills. You have severe nausea that leads to persistent vomiting. You have any of these urinary symptoms: Increased blood or blood clots in the urine. Urine that smells bad or unusual. A strong urge to urinate after emptying your bladder. Pain or burning with urination that does not go away. A continued need to urinate more often than usual. You have a stent, and it comes out. Get help right away if: You have severe pain in your back, sides, or upper abdomen. You faint. You have any of these urinary symptoms: Severe pain while  urinating. More blood in your urine, or blood in your urine when you did not have any before. Blood clots in your urine larger than 1 inch (2.5 cm) in size. You pass only a small amount of urine when you urinate or are unable to pass any urine. This information is not intended to replace advice given to you by your health care provider. Make sure you discuss any questions you have with your health care provider. Document Revised: 11/03/2021 Document Reviewed: 11/03/2021 Elsevier Patient Education  [place].     ESWL for Kidney Stones, Care After The following information offers guidance on how to care for yourself after your procedure. Your health care provider may also give you more specific instructions. If you have problems or questions, contact your health care provider. What can I expect after the procedure? After the procedure, it is common to have: Some blood in your urine. This should only last for a few days. Soreness in your back, sides, or upper abdomen for a few days. Blotches or bruises on the area where the shock wave entered the skin. Pain, discomfort, or nausea when pieces (fragments) of the kidney stone move through the tube that carries urine from the kidney to the bladder (ureter). Fragments may pass soon after the procedure. They may also take up to 4-8 weeks to pass. If you have severe pain or nausea, contact your health care provider. This may be caused by a large stone that was not broken up enough. This may mean that you need more treatment. Some pain or discomfort during urination. Some pain or discomfort in the lower abdomen or at the base of the penis. Follow these instructions at home: Medicines A prescription pill bottle with an example of a pill.   Take over-the-counter and prescription medicines only as told by your health care provider. If you were prescribed antibiotics, take them as told by your health care provider. Do not stop using the  antibiotic even if you start to feel better. Ask your health care provider if the medicine prescribed to you: Requires you to avoid driving or using machinery. Can cause constipation. You may need to take these actions to prevent or treat constipation: Take over-the-counter or prescription medicines. Eat foods that are high in fiber, such as beans, whole grains, and fresh fruits and vegetables. Limit foods that are high in fat and processed sugars, such as fried or sweet foods. Eating and drinking Outline of a person drinking a glass of water.   Follow instructions from your health care provider about what you may eat and drink. You may be told to: Reduce how much salt (sodium) you eat or drink. Check ingredients and nutrition facts on packaged foods and drinks to see how much sodium they contain. Reduce how much meat you eat. Drink enough fluid to keep your urine pale yellow. This can help you pass any pieces of the stone that are left. It can also prevent new stones from forming. Eat plenty of fresh fruits and vegetables. Eat the recommended amount of calcium for your age and gender. Ask your health care provider how much calcium you should have. Activity Get plenty of rest as told by your health care provider. Avoid sitting for a long time without moving. Get up to take short walks every 1-2 hours. This is important to improve blood flow and breathing. Ask for help if you feel weak or unsteady. Your health care provider may tell you to lie in a certain position (postural drainage) and tap firmly (percuss) over your kidney area to help stone fragments pass. Follow instructions as told by your health care provider. Return to your normal activities as told by your health care provider. Ask your health care provider what activities are safe for you. Most people can resume normal activities 1-2 days after the procedure. General instructions If told, strain all urine through the strainer that was  provided by your health care provider. Keep all fragments for your health care provider to see. Any stones that are found may be sent to a medical lab for examination. The stone may be as small as a grain of salt. Keep all follow-up visits. This is important if you had a stent placed because it may need to stay in place for a few weeks. Ask your health care provider when the stent will be removed. Contact a health care provider if: You have a fever or chills. You have severe nausea that leads to persistent vomiting. You have any of these urinary symptoms: Increased blood or blood clots in the urine. Urine that smells bad or unusual. A strong urge to urinate after emptying your bladder. Pain or burning with urination that does not go away. A continued need to urinate more often than usual. You have a stent, and it comes out. Get help right away if: You have severe pain in your back, sides, or upper abdomen. You faint. You have any of these urinary symptoms: Severe pain while urinating. More blood in your urine, or blood in your urine when you did not have any before. Blood clots in your urine larger than 1 inch (2.5 cm) in size. You pass only a small amount of urine when you urinate or are unable to pass any urine. This information is not intended to replace advice given to you by your health care provider. Make sure you discuss any questions you have with your health care provider. Document Revised: 11/03/2021 Document  Reviewed: 11/03/2021 Elsevier Patient Education  [place].

## 2022-05-12 ENCOUNTER — Encounter: Payer: Self-pay | Admitting: Urology

## 2022-05-21 ENCOUNTER — Other Ambulatory Visit: Payer: Self-pay | Admitting: Family Medicine

## 2022-05-30 ENCOUNTER — Ambulatory Visit (AMBULATORY_SURGERY_CENTER): Payer: Self-pay | Admitting: *Deleted

## 2022-05-30 VITALS — Ht 69.0 in | Wt 233.0 lb

## 2022-05-30 DIAGNOSIS — Z86010 Personal history of colonic polyps: Secondary | ICD-10-CM

## 2022-05-30 MED ORDER — NA SULFATE-K SULFATE-MG SULF 17.5-3.13-1.6 GM/177ML PO SOLN: 1.0000 | Freq: Once | ORAL | 0 refills | Status: AC

## 2022-05-30 NOTE — Progress Notes (Signed)
No egg or soy allergy known to patient  No issues known to pt with past sedation with any surgeries or procedures Patient denies ever being told they had issues or difficulty with intubation  No FH of Malignant Hyperthermia Pt is not on diet pills Pt is not on  home 02  Pt is not on blood thinners  Pt denies issues with constipation  No A fib or A flutter Have any cardiac testing pending--no Pt instructed to use Singlecare.com or GoodRx for a price reduction on prep    Patient's chart reviewed by Osvaldo Angst CNRA prior to previsit and patient appropriate for the [place].  Previsit completed and red dot placed by patient's name on their procedure day (on provider's schedule).

## 2022-06-12 ENCOUNTER — Other Ambulatory Visit: Payer: Self-pay | Admitting: Family Medicine

## 2022-06-16 ENCOUNTER — Other Ambulatory Visit: Payer: Self-pay | Admitting: Family Medicine

## 2022-06-27 ENCOUNTER — Encounter: Payer: Self-pay | Admitting: Internal Medicine

## 2022-07-03 ENCOUNTER — Encounter: Payer: Self-pay | Admitting: Internal Medicine

## 2022-07-03 ENCOUNTER — Ambulatory Visit (AMBULATORY_SURGERY_CENTER): Payer: 59 | Admitting: Internal Medicine

## 2022-07-03 VITALS — BP 160/99 | HR 63 | Temp 98.6°F | Resp 12 | Ht 69.0 in | Wt 233.0 lb

## 2022-07-03 DIAGNOSIS — Z09 Encounter for follow-up examination after completed treatment for conditions other than malignant neoplasm: Secondary | ICD-10-CM | POA: Diagnosis present

## 2022-07-03 DIAGNOSIS — Z86010 Personal history of colonic polyps: Secondary | ICD-10-CM | POA: Diagnosis not present

## 2022-07-03 DIAGNOSIS — K5140 Inflammatory polyps of colon without complications: Secondary | ICD-10-CM | POA: Diagnosis not present

## 2022-07-03 DIAGNOSIS — D123 Benign neoplasm of transverse colon: Secondary | ICD-10-CM

## 2022-07-03 MED ORDER — SODIUM CHLORIDE 0.9 % IV SOLN: 500.0000 mL | Freq: Once | INTRAVENOUS | Status: DC

## 2022-07-03 NOTE — Progress Notes (Signed)
VS by DT  Pt's states no medical or surgical changes since previsit or office visit.

## 2022-07-03 NOTE — Progress Notes (Signed)
Sedate, gd SR, tolerated procedure well, VSS, report to RN 

## 2022-07-03 NOTE — Final Progress Note (Signed)
Patient was admitted to recovery sleeping. Woke up and vomited all over the bed. Abd is very distended.  CRNA gave the patient zofran IV.  Patient stopped vomiting and bentyl given.   Patient did state that he had "this trouble the last time he was here."  Up onn all fours and the patient passed a lot of gas.  Vomiting ceased.  Dr and CRNA are aware.  Letter for today tomorrow and the next day off work was given to pt per order of dr Hilarie Fredrickson.

## 2022-07-03 NOTE — Progress Notes (Signed)
GASTROENTEROLOGY PROCEDURE H&P NOTE   Primary Care Physician: Tower, Wynelle Fanny, MD    Reason for Procedure:  History of adenomatous colon polyps including 1 greater than a centimeter  Plan:    Surveillance colonoscopy  Patient is appropriate for endoscopic procedure(s) in the ambulatory ([place]) setting.  The nature of the procedure, as well as the risks, benefits, and alternatives were carefully and thoroughly reviewed with the patient. Ample time for discussion and questions allowed. The patient understood, was satisfied, and agreed to proceed.     HPI: Terry Dixon is a 61 y.o. male who presents for surveillance colonoscopy.  Medical history as below.  Tolerated the prep.  No recent chest pain or shortness of breath.  No abdominal pain today.  Past Medical History:  Diagnosis Date   Allergy    mold,rag weed,   Asthma    Diverticulitis    colon    ED (erectile dysfunction)    Hyperlipidemia    Hypertension    Low testosterone    NASH (nonalcoholic steatohepatitis)    Obesity    Seasonal allergies    Sleep apnea    not used c pap in 6 month,updated 05/30/22    Past Surgical History:  Procedure Laterality Date   COLONOSCOPY     EXTRACORPOREAL SHOCK WAVE LITHOTRIPSY Left 12/22/2021   Procedure: EXTRACORPOREAL SHOCK WAVE LITHOTRIPSY (ESWL);  Surgeon: Royston Cowper, MD;  Location: [facility] ORS;  Service: Urology;  Laterality: Left;   EXTRACORPOREAL SHOCK WAVE LITHOTRIPSY Left 05/11/2022   Procedure: EXTRACORPOREAL SHOCK WAVE LITHOTRIPSY (ESWL);  Surgeon: Royston Cowper, MD;  Location: [facility] ORS;  Service: Urology;  Laterality: Left;   HERNIA REPAIR  07/17/1998   R & L   VASECTOMY  07/17/1997    Prior to Admission medications   Medication Sig Start Date End Date Taking? Authorizing Provider  losartan (COZAAR) 100 MG tablet TAKE 1 TABLET BY MOUTH EVERY DAY 03/24/22  Yes Tower, Wynelle Fanny, MD  Multiple Vitamin (DAILY MULTIVITAMIN PO) Take 1 tablet by mouth daily.   Yes  [provider]  Omega-3 Fatty Acids (FISH OIL) 1200 MG CAPS Take by mouth daily.   Yes [provider]  [facility] DELICA LANCETS 38H MISC Check blood sugar twice a day and as directed. Dx E11.9 09/12/16  Yes Tower, Wynelle Fanny, MD  ONETOUCH ULTRA test strip CHECK BLOOD SUGAR TWICE A DAY AND AS DIRECTED. DX E11.9 03/07/21  Yes Tower, Wynelle Fanny, MD  acetaminophen-codeine (TYLENOL #3) 300-30 MG tablet Take 1-2 tablets by mouth every 4 (four) hours as needed for moderate pain. Patient not taking: Reported on 05/30/2022 12/22/21   Royston Cowper, MD  albuterol (PROVENTIL HFA;VENTOLIN HFA) 108 (90 BASE) MCG/ACT inhaler Inhale 2 puffs into the lungs every 6 (six) hours as needed for wheezing or shortness of breath. 12/18/13   Jearld Fenton, NP  cetirizine (ZYRTEC) 10 MG tablet Take 10 mg by mouth daily as needed for allergies.    [provider]  CIALIS 5 MG tablet TAKE 1 TABLET (5 MG TOTAL) BY MOUTH DAILY AS NEEDED FOR ERECTILE DYSFUNCTION. 04/03/18   Tower, Wynelle Fanny, MD  EPIPEN 2-PAK 0.3 MG/0.3ML SOAJ injection See admin instructions. Reported on 07/06/2015-use as needed Patient not taking: Reported on 05/30/2022 07/13/14   [provider]  fluticasone (FLONASE) 50 MCG/ACT nasal spray Place 2 sprays into both nostrils daily as needed.  06/09/14   [provider]  ondansetron (ZOFRAN-ODT) 4 MG disintegrating tablet Take 1  tablet (4 mg total) by mouth every 6 (six) hours as needed for nausea or vomiting. Patient not taking: Reported on 05/30/2022 12/08/21   Ward, Delice Bison, DO  oxyCODONE-acetaminophen (PERCOCET) 5-325 MG tablet Take 2 tablets by mouth every 6 (six) hours as needed for severe pain. Patient not taking: Reported on 05/30/2022 12/08/21 12/08/22  Ward, Delice Bison, DO  SYMBICORT 80-4.5 MCG/ACT inhaler Inhale 1 puff into the lungs daily as needed. 10/31/17   [provider]  tamsulosin (FLOMAX) 0.4 MG CAPS capsule Take 1 capsule (0.4 mg total) by mouth  daily. 05/11/22   Royston Cowper, MD    Current Outpatient Medications  Medication Sig Dispense Refill   losartan (COZAAR) 100 MG tablet TAKE 1 TABLET BY MOUTH EVERY DAY 90 tablet 0   Multiple Vitamin (DAILY MULTIVITAMIN PO) Take 1 tablet by mouth daily.     Omega-3 Fatty Acids (FISH OIL) 1200 MG CAPS Take by mouth daily.     ONETOUCH DELICA LANCETS 67M MISC Check blood sugar twice a day and as directed. Dx E11.9 100 each 3   ONETOUCH ULTRA test strip CHECK BLOOD SUGAR TWICE A DAY AND AS DIRECTED. DX E11.9 50 strip 11   acetaminophen-codeine (TYLENOL #3) 300-30 MG tablet Take 1-2 tablets by mouth every 4 (four) hours as needed for moderate pain. (Patient not taking: Reported on 05/30/2022) 20 tablet 1   albuterol (PROVENTIL HFA;VENTOLIN HFA) 108 (90 BASE) MCG/ACT inhaler Inhale 2 puffs into the lungs every 6 (six) hours as needed for wheezing or shortness of breath. 1 Inhaler 0   cetirizine (ZYRTEC) 10 MG tablet Take 10 mg by mouth daily as needed for allergies.     CIALIS 5 MG tablet TAKE 1 TABLET (5 MG TOTAL) BY MOUTH DAILY AS NEEDED FOR ERECTILE DYSFUNCTION. 10 tablet 5   EPIPEN 2-PAK 0.3 MG/0.3ML SOAJ injection See admin instructions. Reported on 07/06/2015-use as needed (Patient not taking: Reported on 05/30/2022)  11   fluticasone (FLONASE) 50 MCG/ACT nasal spray Place 2 sprays into both nostrils daily as needed.   19   ondansetron (ZOFRAN-ODT) 4 MG disintegrating tablet Take 1 tablet (4 mg total) by mouth every 6 (six) hours as needed for nausea or vomiting. (Patient not taking: Reported on 05/30/2022) 20 tablet 0   oxyCODONE-acetaminophen (PERCOCET) 5-325 MG tablet Take 2 tablets by mouth every 6 (six) hours as needed for severe pain. (Patient not taking: Reported on 05/30/2022) 16 tablet 0   SYMBICORT 80-4.5 MCG/ACT inhaler Inhale 1 puff into the lungs daily as needed.     tamsulosin (FLOMAX) 0.4 MG CAPS capsule Take 1 capsule (0.4 mg total) by mouth daily. 30 capsule 11   Current  Facility-Administered Medications  Medication Dose Route Frequency Provider Last Rate Last Admin   0.9 %  sodium chloride infusion  500 mL Intravenous Once Kelsee Preslar, Lajuan Lines, MD        Allergies as of 07/03/2022 - Review Complete 07/03/2022  Allergen Reaction Noted   Milk (cow) Other (See Comments) 05/11/2022   Viagra [sildenafil citrate]  12/05/2013    Family History  Problem Relation Age of Onset   Diabetes Mother    COPD Mother    Benign prostatic hyperplasia Father    Hypertension Father    Breast cancer Sister 80       died at 58   Prostate cancer Maternal Grandfather    Colon cancer Neg Hx    Esophageal cancer Neg Hx    Liver disease Neg Hx  Stomach cancer Neg Hx    Pancreatic cancer Neg Hx    Colon polyps Neg Hx    Crohn's disease Neg Hx    Rectal cancer Neg Hx    Ulcerative colitis Neg Hx     Social History   Socioeconomic History   Marital status: Divorced    Spouse name: Not on file   Number of children: 1   Years of education: Not on file   Highest education level: Not on file  Occupational History   Occupation: [employer]ologist, forensic (electrician)  Tobacco Use   Smoking status: Former    Types: Cigarettes    Passive exposure: Never   Smokeless tobacco: Never   Tobacco comments:    age 13 for 1 year   Vaping Use   Vaping Use: Never used  Substance and Sexual Activity   Alcohol use: Yes    Comment: 1 beer a week   Drug use: No   Sexual activity: Not on file  Other Topics Concern   Not on file  Social History Narrative   Regular exercise- yes, gym 2 x week, mnt. Biking 1 x week, aerobics 1 hr 2 x week.    Social Determinants of Health   Financial Resource Strain: Not on file  Food Insecurity: Not on file  Transportation Needs: Not on file  Physical Activity: Not on file  Stress: Not on file  Social Connections: Not on file  Intimate Partner Violence: Not on file    Physical Exam: Vital signs in last 24 hours: @BP  124/78   Pulse 68   Temp 98.6 F  (37 C)   Ht 5' 9"  (1.753 m)   Wt 233 lb (105.7 kg)   SpO2 96%   BMI 34.41 kg/m  GEN: NAD EYE: Sclerae anicteric ENT: MMM CV: Non-tachycardic Pulm: CTA b/l GI: Soft, NT/ND NEURO:  Alert & Oriented x 3   Zenovia Jarred, MD [facility]  07/03/2022 10:35 AM

## 2022-07-03 NOTE — Op Note (Signed)
[place] Patient Name: Terry Dixon Procedure Date: 07/03/2022 10:43 AM MRN: [id number] Endoscopist: Jerene Bears , MD, [id number] Age: 61 Referring MD:  Date of Birth: [birth year] Gender: Male Account #: [id number] Procedure:                Colonoscopy Indications:              High risk colon cancer surveillance: Personal                            history of adenoma (10 mm or greater in size), Last                            colonoscopy: June 2020 Medicines:                Monitored Anesthesia Care Procedure:                Pre-Anesthesia Assessment:                           - Prior to the procedure, a History and Physical                            was performed, and patient medications and                            allergies were reviewed. The patient's tolerance of                            previous anesthesia was also reviewed. The risks                            and benefits of the procedure and the sedation                            options and risks were discussed with the patient.                            All questions were answered, and informed consent                            was obtained. Prior Anticoagulants: The patient has                            taken no anticoagulant or antiplatelet agents. ASA                            Grade Assessment: II - A patient with mild systemic                            disease. After reviewing the risks and benefits,                            the patient was deemed in satisfactory condition to  undergo the procedure.                           After obtaining informed consent, the colonoscope                            was passed under direct vision. Throughout the                            procedure, the patient's blood pressure, pulse, and                            oxygen saturations were monitored continuously. The                            CF [id number] #[id number] was introduced  through the anus                            and advanced to the cecum, identified by the                            appendiceal orifice. The colonoscopy was performed                            without difficulty. The patient tolerated the                            procedure well. The quality of the bowel                            preparation was excellent. The ileocecal valve,                            appendiceal orifice, and rectum were photographed. Scope In: 10:50:42 AM Scope Out: 11:05:16 AM Scope Withdrawal Time: 0 hours 13 minutes 3 seconds  Total Procedure Duration: 0 hours 14 minutes 34 seconds  Findings:                 The digital rectal exam was normal.                           Two pedunculated and sessile polyps were found in                            the transverse colon. The polyps were 2 to 5 mm in                            size. These polyps were removed with a cold snare.                            Resection and retrieval were complete.                           Internal hemorrhoids were found during  retroflexion. The hemorrhoids were small. Complications:            No immediate complications. Estimated Blood Loss:     Estimated blood loss: none. Impression:               - Two 2 to 5 mm polyps in the transverse colon,                            removed with a cold snare. Resected and retrieved.                           - Small internal hemorrhoids. Recommendation:           - Patient has a contact number available for                            emergencies. The signs and symptoms of potential                            delayed complications were discussed with the                            patient. Return to normal activities tomorrow.                            Written discharge instructions were provided to the                            patient.                           - Resume previous diet.                           -  Continue present medications.                           - Await pathology results.                           - Repeat colonoscopy in 5 years for surveillance. Jerene Bears, MD 07/03/2022 11:07:39 AM This report has been signed electronically.

## 2022-07-03 NOTE — Patient Instructions (Signed)
Read all of the handouts given to you by your recovery room nurse.  Resume all of your medications as ordered.  YOU HAD AN ENDOSCOPIC PROCEDURE TODAY AT [facility]:   Refer to the procedure report that was given to you for any specific questions about what was found during the examination.  If the procedure report does not answer your questions, please call your gastroenterologist to clarify.  If you requested that your care partner not be given the details of your procedure findings, then the procedure report has been included in a sealed envelope for you to review at your convenience later.  YOU SHOULD EXPECT: Some feelings of bloating in the abdomen. Passage of more gas than usual.  Walking can help get rid of the air that was put into your GI tract during the procedure and reduce the bloating. If you had a lower endoscopy (such as a colonoscopy or flexible sigmoidoscopy) you may notice spotting of blood in your stool or on the toilet paper. If you underwent a bowel prep for your procedure, you may not have a normal bowel movement for a few days.  Please Note:  You might notice some irritation and congestion in your nose or some drainage.  This is from the oxygen used during your procedure.  There is no need for concern and it should clear up in a day or so.  SYMPTOMS TO REPORT IMMEDIATELY:  Following lower endoscopy (colonoscopy or flexible sigmoidoscopy):  Excessive amounts of blood in the stool  Significant tenderness or worsening of abdominal pains  Swelling of the abdomen that is new, acute  Fever of 100F or higher   Fever of 100F or higher  Black, tarry-looking stools  For urgent or emergent issues, a gastroenterologist can be reached at any hour by calling [phone]. Do not use MyChart messaging for urgent concerns.    DIET:  We do recommend a small meal at first, but then you may proceed to your regular diet.  Drink plenty of fluids but you should avoid  alcoholic beverages for 24 hours. No gassy foods today. No spicy foods today.  ACTIVITY:  You should plan to take it easy for the rest of today and you should NOT DRIVE or use heavy machinery until tomorrow (because of the sedation medicines used during the test).    FOLLOW UP: Our staff will call the number listed on your records the next business day following your procedure.  We will call around 7:15- 8:00 am to check on you and address any questions or concerns that you may have regarding the information given to you following your procedure. If we do not reach you, we will leave a message.     If any biopsies were taken you will be contacted by phone or by letter within the next 1-3 weeks.  Please call us at [phone] if you have not heard about the biopsies in 3 weeks.    SIGNATURES/CONFIDENTIALITY: You and/or your care partner have signed paperwork which will be entered into your electronic medical record.  These signatures attest to the fact that that the information above on your After Visit Summary has been reviewed and is understood.  Full responsibility of the confidentiality of this discharge information lies with you and/or your care-partner.

## 2022-07-03 NOTE — Progress Notes (Signed)
Called to room to assist during endoscopic procedure.  Patient ID and intended procedure confirmed with present staff. Received instructions for my participation in the procedure from the performing physician.  

## 2022-07-04 ENCOUNTER — Telehealth: Payer: Self-pay

## 2022-07-04 NOTE — Telephone Encounter (Signed)
  Follow up Call-     07/03/2022   10:10 AM  Call back number  Post procedure Call Back phone  # [phone]  Permission to leave phone message Yes     Patient questions:  Do you have a fever, pain , or abdominal swelling? No. Pain Score  0 *  Have you tolerated food without any problems? Yes.    Have you been able to return to your normal activities? Yes.    Do you have any questions about your discharge instructions: Diet   No. Medications  No. Follow up visit  No.  Do you have questions or concerns about your Care? No.  Actions: * If pain score is 4 or above: No action needed, pain <4.

## 2022-07-06 ENCOUNTER — Encounter: Payer: Self-pay | Admitting: Internal Medicine

## 2022-08-13 ENCOUNTER — Ambulatory Visit
Admission: EM | Admit: 2022-08-13 | Discharge: 2022-08-13 | Disposition: A | Discharge status: Home / Self Care | Payer: 59 | Attending: Emergency Medicine | Admitting: Emergency Medicine

## 2022-08-13 DIAGNOSIS — J45901 Unspecified asthma with (acute) exacerbation: Secondary | ICD-10-CM | POA: Diagnosis not present

## 2022-08-13 DIAGNOSIS — R051 Acute cough: Secondary | ICD-10-CM | POA: Diagnosis not present

## 2022-08-13 MED ORDER — ALBUTEROL SULFATE HFA 108 (90 BASE) MCG/ACT IN AERS: 1.0000 | INHALATION_SPRAY | Freq: Four times a day (QID) | RESPIRATORY_TRACT | 0 refills | Status: DC | PRN

## 2022-08-13 MED ORDER — PREDNISONE 10 MG PO TABS: 40.0000 mg | ORAL_TABLET | Freq: Every day | ORAL | 0 refills | Status: AC

## 2022-08-13 MED ORDER — PROMETHAZINE-DM 6.25-15 MG/5ML PO SYRP: 5.0000 mL | ORAL_SOLUTION | Freq: Four times a day (QID) | ORAL | 0 refills | Status: DC | PRN

## 2022-08-13 NOTE — ED Provider Notes (Signed)
Terry Dixon    CSN: [id number] Arrival date & time: 08/13/22  6283      History   Chief Complaint Chief Complaint  Patient presents with   Cough    HPI Terry Dixon is a 62 y.o. male.  Patient presents with 3 day history of postnasal drip, runny nose, and nonproductive cough.  The cough is keeping him awake.  No fever, sore throat, shortness of breath, chest pain, or other symptoms.  Treatment attempted with Mucinex, Zyrtec, Flonase, Symbicort.  His medical history includes asthma, allergies, sleep apnea, hypertension, hyperlipidemia, diverticulitis, fatty liver, BPH.   The history is provided by the patient and medical records.    Past Medical History:  Diagnosis Date   Allergy    mold,rag weed,   Asthma    Diverticulitis    colon    ED (erectile dysfunction)    Family history of non-anemic vitamin B12 deficiency 05/22/2011   Hyperlipidemia    Hypertension    Low testosterone    NASH (nonalcoholic steatohepatitis)    Obesity    Seasonal allergies    Sleep apnea    not used c pap in 6 month,updated 05/30/22    Patient Active Problem List   Diagnosis Date Noted   BPH (benign prostatic hyperplasia) 02/16/2021   Occult blood positive stool 09/24/2018   Prediabetes 03/11/2016   Prostate cancer screening 12/09/2014   Steatohepatitis, non-alcoholic 15/17/6160   Colon cancer screening 05/31/2012   Low serum testosterone level 11/28/2011   Routine general medical examination at a health care facility 11/23/2011   Family history of non-anemic vitamin B12 deficiency 05/22/2011   ERECTILE DYSFUNCTION 05/26/2008   Class 1 obesity due to excess calories with serious comorbidity and body mass index (BMI) of 33.0 to 33.9 in adult 01/03/2007   Hyperlipidemia 01/01/2007   Essential hypertension 01/01/2007   DIVERTICULOSIS, COLON 01/01/2007    Past Surgical History:  Procedure Laterality Date   COLONOSCOPY     EXTRACORPOREAL SHOCK WAVE LITHOTRIPSY Left  12/22/2021   Procedure: EXTRACORPOREAL SHOCK WAVE LITHOTRIPSY (ESWL);  Surgeon: Royston Cowper, MD;  Location: [facility];  Service: Urology;  Laterality: Left;   EXTRACORPOREAL SHOCK WAVE LITHOTRIPSY Left 05/11/2022   Procedure: EXTRACORPOREAL SHOCK WAVE LITHOTRIPSY (ESWL);  Surgeon: Royston Cowper, MD;  Location: [facility];  Service: Urology;  Laterality: Left;   HERNIA REPAIR  07/17/1998   R & L   VASECTOMY  07/17/1997       Home Medications    Prior to Admission medications   Medication Sig Start Date End Date Taking? Authorizing Provider  albuterol (VENTOLIN HFA) 108 (90 Base) MCG/ACT inhaler Inhale 1-2 puffs into the lungs every 6 (six) hours as needed. 08/13/22  Yes Sharion Balloon, NP  predniSONE (DELTASONE) 10 MG tablet Take 4 tablets (40 mg total) by mouth daily for 5 days. 08/13/22 08/18/22 Yes Sharion Balloon, NP  promethazine-dextromethorphan (PROMETHAZINE-DM) 6.25-15 MG/5ML syrup Take 5 mLs by mouth 4 (four) times daily as needed. 08/13/22  Yes Sharion Balloon, NP  acetaminophen-codeine (TYLENOL #3) 300-30 MG tablet Take 1-2 tablets by mouth every 4 (four) hours as needed for moderate pain. Patient not taking: Reported on 05/30/2022 12/22/21   Royston Cowper, MD  cetirizine (ZYRTEC) 10 MG tablet Take 10 mg by mouth daily as needed for allergies.    [provider]  CIALIS 5 MG tablet TAKE 1 TABLET (5 MG TOTAL) BY MOUTH DAILY AS NEEDED FOR ERECTILE DYSFUNCTION. 04/03/18  Tower, Wynelle Fanny, MD  EPIPEN 2-PAK 0.3 MG/0.3ML SOAJ injection See admin instructions. Reported on 07/06/2015-use as needed Patient not taking: Reported on 05/30/2022 07/13/14   [provider]  fluticasone (FLONASE) 50 MCG/ACT nasal spray Place 2 sprays into both nostrils daily as needed.  06/09/14   [provider]  losartan (COZAAR) 100 MG tablet TAKE 1 TABLET BY MOUTH EVERY DAY 03/24/22   Tower, Wynelle Fanny, MD  Multiple Vitamin (DAILY MULTIVITAMIN PO) Take 1 tablet by mouth daily.    [provider]  Omega-3 Fatty Acids (FISH OIL) 1200 MG CAPS Take by mouth daily.    [provider]  ondansetron (ZOFRAN-ODT) 4 MG disintegrating tablet Take 1 tablet (4 mg total) by mouth every 6 (six) hours as needed for nausea or vomiting. Patient not taking: Reported on 05/30/2022 12/08/21   Ward, Delice Bison, DO  ONETOUCH DELICA LANCETS 66Q MISC Check blood sugar twice a day and as directed. Dx E11.9 09/12/16   Tower, Wynelle Fanny, MD  ONETOUCH ULTRA test strip CHECK BLOOD SUGAR TWICE A DAY AND AS DIRECTED. DX E11.9 03/07/21   Tower, Wynelle Fanny, MD  oxyCODONE-acetaminophen (PERCOCET) 5-325 MG tablet Take 2 tablets by mouth every 6 (six) hours as needed for severe pain. Patient not taking: Reported on 05/30/2022 12/08/21 12/08/22  Ward, Delice Bison, DO  SYMBICORT 80-4.5 MCG/ACT inhaler Inhale 1 puff into the lungs daily as needed. 10/31/17   [provider]  tamsulosin (FLOMAX) 0.4 MG CAPS capsule Take 1 capsule (0.4 mg total) by mouth daily. 05/11/22   Royston Cowper, MD    Family History Family History  Problem Relation Age of Onset   Diabetes Mother    COPD Mother    Benign prostatic hyperplasia Father    Hypertension Father    Breast cancer Sister 57       died at 43   Prostate cancer Maternal Grandfather    Colon cancer Neg Hx    Esophageal cancer Neg Hx    Liver disease Neg Hx    Stomach cancer Neg Hx    Pancreatic cancer Neg Hx    Colon polyps Neg Hx    Crohn's disease Neg Hx    Rectal cancer Neg Hx    Ulcerative colitis Neg Hx     Social History Social History   Tobacco Use   Smoking status: Former    Types: Cigarettes    Passive exposure: Never   Smokeless tobacco: Never   Tobacco comments:    age 62 for 1 year   Vaping Use   Vaping Use: Never used  Substance Use Topics   Alcohol use: Yes    Comment: very rarely   Drug use: No     Allergies   Milk (cow) and Viagra [sildenafil citrate]   Review of Systems Review of Systems  Constitutional:   Negative for chills and fever.  HENT:  Positive for postnasal drip and rhinorrhea. Negative for ear pain and sore throat.   Respiratory:  Positive for cough. Negative for shortness of breath.   Cardiovascular:  Negative for chest pain and palpitations.  Gastrointestinal:  Negative for diarrhea and vomiting.  All other systems reviewed and are negative.    Physical Exam Triage Vital Signs ED Triage Vitals  Enc Vitals Group     BP      Pulse      Resp      Temp      Temp src  SpO2      Weight      Height      Head Circumference      Peak Flow      Pain Score      Pain Loc      Pain Edu?      Excl. in [place]?    No data found.  Updated Vital Signs BP 135/72   Pulse 70   Temp 99.8 F (37.7 C)   Resp 18   SpO2 95%   Visual Acuity Right Eye Distance:   Left Eye Distance:   Bilateral Distance:    Right Eye Near:   Left Eye Near:    Bilateral Near:     Physical Exam Vitals and nursing note reviewed.  Constitutional:      General: He is not in acute distress.    Appearance: He is well-developed. He is not ill-appearing.  HENT:     Right Ear: Tympanic membrane normal.     Left Ear: Tympanic membrane normal.     Nose: Rhinorrhea present.     Mouth/Throat:     Mouth: Mucous membranes are moist.     Pharynx: Oropharynx is clear.  Cardiovascular:     Rate and Rhythm: Normal rate and regular rhythm.     Heart sounds: Normal heart sounds.  Pulmonary:     Effort: Pulmonary effort is normal. No respiratory distress.     Breath sounds: Normal breath sounds. No wheezing.     Comments: Frequent nonproductive cough during exam. Musculoskeletal:     Cervical back: Neck supple.  Skin:    General: Skin is warm and dry.  Neurological:     Mental Status: He is alert.  Psychiatric:        Mood and Affect: Mood normal.        Behavior: Behavior normal.      UC Treatments / Results  Labs (all labs ordered are listed, but only abnormal results are displayed) Labs  Reviewed - No data to display  EKG   Radiology No results found.  Procedures Procedures (including critical care time)  Medications Ordered in UC Medications - No data to display  Initial Impression / Assessment and Plan / UC Course  I have reviewed the triage vital signs and the nursing notes.  Pertinent labs & imaging results that were available during my care of the patient were reviewed by me and considered in my medical decision making (see chart for details).    Asthma exacerbation, cough.  No respiratory distress, O2 sat 95% on room air.  Treating with prednisone, albuterol inhaler, and Promethazine DM.  Precautions for drowsiness with Promethazine DM discussed.  Education provided on asthma.  Instructed patient to follow-up with his PCP if his symptoms are not improving.  He agrees to plan of care.      Final Clinical Impressions(s) / UC Diagnoses   Final diagnoses:  Asthma with acute exacerbation, unspecified asthma severity, unspecified whether persistent  Acute cough     Discharge Instructions      Take the prednisone and use the albuterol inhaler as directed.    Take the promethazine DM as directed.  Do not drive, operate machinery, drink alcohol, or perform dangerous activities while taking this medication as it may cause drowsiness.  Follow up with your primary care provider if your symptoms are not improving.        ED Prescriptions     Medication Sig Dispense Auth. Provider   predniSONE (DELTASONE)  10 MG tablet Take 4 tablets (40 mg total) by mouth daily for 5 days. 20 tablet Sharion Balloon, NP   albuterol (VENTOLIN HFA) 108 (90 Base) MCG/ACT inhaler Inhale 1-2 puffs into the lungs every 6 (six) hours as needed. 18 g Sharion Balloon, NP   promethazine-dextromethorphan (PROMETHAZINE-DM) 6.25-15 MG/5ML syrup Take 5 mLs by mouth 4 (four) times daily as needed. 118 mL Sharion Balloon, NP      PDMP not reviewed this encounter.   Sharion Balloon,  NP 08/13/22 [phone]

## 2022-08-13 NOTE — ED Triage Notes (Signed)
Patient to Urgent Care with complaints of cough and nasal drainage. Describes cough as dry.  Reports symptoms started Thursday night. Denies any known fevers.   Has been taking mucinex/ zyrtec/ flonase/ Symbicort inhaler.

## 2022-08-13 NOTE — Discharge Instructions (Addendum)
Take the prednisone and use the albuterol inhaler as directed.    Take the promethazine DM as directed.  Do not drive, operate machinery, drink alcohol, or perform dangerous activities while taking this medication as it may cause drowsiness.  Follow up with your primary care provider if your symptoms are not improving.

## 2022-09-11 ENCOUNTER — Other Ambulatory Visit: Payer: Self-pay | Admitting: Family Medicine

## 2022-09-28 ENCOUNTER — Telehealth (INDEPENDENT_AMBULATORY_CARE_PROVIDER_SITE_OTHER): Payer: 59 | Admitting: Family Medicine

## 2022-09-28 ENCOUNTER — Other Ambulatory Visit (INDEPENDENT_AMBULATORY_CARE_PROVIDER_SITE_OTHER): Payer: 59

## 2022-09-28 DIAGNOSIS — Z125 Encounter for screening for malignant neoplasm of prostate: Secondary | ICD-10-CM | POA: Diagnosis not present

## 2022-09-28 DIAGNOSIS — E7800 Pure hypercholesterolemia, unspecified: Secondary | ICD-10-CM

## 2022-09-28 DIAGNOSIS — I10 Essential (primary) hypertension: Secondary | ICD-10-CM | POA: Diagnosis not present

## 2022-09-28 DIAGNOSIS — N401 Enlarged prostate with lower urinary tract symptoms: Secondary | ICD-10-CM

## 2022-09-28 DIAGNOSIS — R7303 Prediabetes: Secondary | ICD-10-CM

## 2022-09-28 LAB — COMPREHENSIVE METABOLIC PANEL
ALT: 62 U/L — ABNORMAL HIGH (ref 0–53)
AST: 39 U/L — ABNORMAL HIGH (ref 0–37)
Albumin: 4.2 g/dL (ref 3.5–5.2)
Alkaline Phosphatase: 70 U/L (ref 39–117)
BUN: 16 mg/dL (ref 6–23)
CO2: 25 mEq/L (ref 19–32)
Calcium: 8.9 mg/dL (ref 8.4–10.5)
Chloride: 102 mEq/L (ref 96–112)
Creatinine, Ser: 0.9 mg/dL (ref 0.40–1.50)
GFR: 92.73 mL/min (ref >60.00)
Glucose, Bld: 93 mg/dL (ref 70–99)
Potassium: 3.9 mEq/L (ref 3.5–5.1)
Sodium: 136 mEq/L (ref 135–145)
Total Bilirubin: 1.3 mg/dL — ABNORMAL HIGH (ref 0.2–1.2)
Total Protein: 6.4 g/dL (ref 6.0–8.3)

## 2022-09-28 LAB — LIPID PANEL
Cholesterol: 145 mg/dL (ref 0–200)
HDL: 37.6 mg/dL — ABNORMAL LOW (ref >39.00)
LDL Cholesterol: 80 mg/dL (ref 0–99)
NonHDL: 107.76
Total CHOL/HDL Ratio: 4
Triglycerides: 138 mg/dL (ref 0.0–149.0)
VLDL: 27.6 mg/dL (ref 0.0–40.0)

## 2022-09-28 LAB — PSA: PSA: 1.1 ng/mL (ref 0.10–4.00)

## 2022-09-28 LAB — HEMOGLOBIN A1C: Hgb A1c MFr Bld: 7.9 % — ABNORMAL HIGH (ref 4.6–6.5)

## 2022-09-28 LAB — TSH: TSH: 1.6 u[IU]/mL (ref 0.35–5.50)

## 2022-09-28 NOTE — Telephone Encounter (Signed)
-----   Message from Ellamae Sia sent at 09/28/2022  8:44 AM EDT ----- Regarding: lab orders for now Patient is scheduled for CPX labs, please order future labs, Thanks , Karna Christmas

## 2022-09-29 LAB — CBC WITH DIFFERENTIAL/PLATELET
Basophils Absolute: 0 10*3/uL (ref 0.0–0.1)
Basophils Relative: 0.6 % (ref 0.0–3.0)
Eosinophils Absolute: 0.1 10*3/uL (ref 0.0–0.7)
Eosinophils Relative: 1.1 % (ref 0.0–5.0)
HCT: 42.9 % (ref 39.0–52.0)
Hemoglobin: 14.5 g/dL (ref 13.0–17.0)
Lymphocytes Relative: 22.3 % (ref 12.0–46.0)
Lymphs Abs: 1.6 10*3/uL (ref 0.7–4.0)
MCHC: 33.8 g/dL (ref 30.0–36.0)
MCV: 89.5 fl (ref 78.0–100.0)
Monocytes Absolute: 0.5 10*3/uL (ref 0.1–1.0)
Monocytes Relative: 6.9 % (ref 3.0–12.0)
Neutro Abs: 4.9 10*3/uL (ref 1.4–7.7)
Neutrophils Relative %: 69.1 % (ref 43.0–77.0)
Platelets: 188 10*3/uL (ref 150.0–400.0)
RBC: 4.8 Mil/uL (ref 4.22–5.81)
RDW: 13.4 % (ref 11.5–15.5)
WBC: 7.1 10*3/uL (ref 4.0–10.5)

## 2022-10-05 ENCOUNTER — Encounter: Payer: 59 | Admitting: Family Medicine

## 2022-10-09 ENCOUNTER — Encounter: Payer: Self-pay | Admitting: Family Medicine

## 2022-10-09 ENCOUNTER — Ambulatory Visit (INDEPENDENT_AMBULATORY_CARE_PROVIDER_SITE_OTHER): Payer: 59 | Admitting: Family Medicine

## 2022-10-09 VITALS — BP 118/60 | HR 76 | Temp 97.9°F | Ht 68.5 in | Wt 220.0 lb

## 2022-10-09 DIAGNOSIS — N401 Enlarged prostate with lower urinary tract symptoms: Secondary | ICD-10-CM | POA: Diagnosis not present

## 2022-10-09 DIAGNOSIS — E1169 Type 2 diabetes mellitus with other specified complication: Secondary | ICD-10-CM

## 2022-10-09 DIAGNOSIS — K5140 Inflammatory polyps of colon without complications: Secondary | ICD-10-CM | POA: Insufficient documentation

## 2022-10-09 DIAGNOSIS — Z0000 Encounter for general adult medical examination without abnormal findings: Secondary | ICD-10-CM

## 2022-10-09 DIAGNOSIS — Z1211 Encounter for screening for malignant neoplasm of colon: Secondary | ICD-10-CM

## 2022-10-09 DIAGNOSIS — I10 Essential (primary) hypertension: Secondary | ICD-10-CM

## 2022-10-09 DIAGNOSIS — K7581 Nonalcoholic steatohepatitis (NASH): Secondary | ICD-10-CM

## 2022-10-09 DIAGNOSIS — Z125 Encounter for screening for malignant neoplasm of prostate: Secondary | ICD-10-CM

## 2022-10-09 DIAGNOSIS — E119 Type 2 diabetes mellitus without complications: Secondary | ICD-10-CM | POA: Diagnosis not present

## 2022-10-09 DIAGNOSIS — E6609 Other obesity due to excess calories: Secondary | ICD-10-CM

## 2022-10-09 DIAGNOSIS — E785 Hyperlipidemia, unspecified: Secondary | ICD-10-CM

## 2022-10-09 DIAGNOSIS — Z6832 Body mass index (BMI) 32.0-32.9, adult: Secondary | ICD-10-CM

## 2022-10-09 MED ORDER — EPIPEN 2-PAK 0.3 MG/0.3ML IJ SOAJ: 0.3000 mg | INTRAMUSCULAR | 11 refills | Status: AC | PRN

## 2022-10-09 MED ORDER — ONETOUCH ULTRA VI STRP: ORAL_STRIP | 3 refills | Status: DC

## 2022-10-09 NOTE — Assessment & Plan Note (Signed)
Continues urology care Lab Results  Component Value Date   PSA 1.10 09/28/2022   PSA 1.10 02/11/2021   PSA 0.70 02/09/2020

## 2022-10-09 NOTE — Assessment & Plan Note (Signed)
Colonoscopy 06/2022 with 5 y recall

## 2022-10-09 NOTE — Assessment & Plan Note (Signed)
Colonoscopy 06/2022 with recall in 5 y

## 2022-10-09 NOTE — Progress Notes (Signed)
Subjective:    Patient ID: Terry Dixon, male    DOB: 1961/06/26, 62 y.o.   MRN: [id number]  HPI Here for health maintenance exam and to review chronic medical problems    Wt Readings from Last 3 Encounters:  10/09/22 220 lb (99.8 kg)  07/03/22 233 lb (105.7 kg)  05/30/22 233 lb (105.7 kg)   32.96 kg/m  Was unemployed for a while Now working [employer] Likes this job Works 3rd shift (12 hours) - has always worked night shift   Taking care of himself   Is on more of a keto diet Has cut a lot of cravings   Exercise- a little bit when he can fit in  Works 6 twelve h shifts per week    Vitals:   10/09/22 1413  BP: 118/60  Pulse: 76  Temp: 97.9 F (36.6 C)  SpO2: 96%     Immunization History  Administered Date(s) Administered   Influenza Split 05/26/2011, 05/31/2012   Influenza Whole 05/14/2009, 05/23/2010   Influenza,inj,Quad PF,6+ Mos 06/06/2013, 06/08/2014, 06/07/2015, 09/12/2016, 06/18/2017   PFIZER Comirnaty(Gray Top)Covid-19 Tri-Sucrose Vaccine 06/03/2021, 06/24/2021   Td 07/17/1998, 05/14/2009   Tdap 11/07/2018   Health Maintenance Due  Topic Date Due   HIV Screening  Never done   Diabetic kidney evaluation - Urine ACR  Never done   Hepatitis C Screening  Never done   FOOT EXAM  11/06/2018   INFLUENZA VACCINE  02/14/2022   COVID-19 Vaccine (3 - 2023-24 season) 03/17/2022    Flu shot -in the fall   Eye exam- had one in August / no retinopathy   Shingrix vaccine - declines   Colonoscopy 06/2022 with 5 y recall   Prostate health  Lab Results  Component Value Date   PSA 1.10 09/28/2022   PSA 1.10 02/11/2021   PSA 0.70 02/09/2020   Has seen urology for BPH and low T and ED and renal stones  Dr Eliberto Ivory Takes flomax -does well with  Had flare of kidney stones in June and had lithotripsy  A lot to go through   Father -prostate cancer in his 85s    HTN bp is stable today  No cp or palpitations or headaches or edema  No  side effects to medicines  BP Readings from Last 3 Encounters:  10/09/22 118/60  08/13/22 135/72  07/03/22 (!) 160/99    Losartan 100 mg daily   Last metabolic panel Lab Results  Component Value Date   GLUCOSE 93 09/28/2022   NA 136 09/28/2022   K 3.9 09/28/2022   CL 102 09/28/2022   CO2 25 09/28/2022   BUN 16 09/28/2022   CREATININE 0.90 09/28/2022   GFRNONAA >60 12/07/2021   CALCIUM 8.9 09/28/2022   PHOS 2.9 05/22/2011   PROT 6.4 09/28/2022   ALBUMIN 4.2 09/28/2022   BILITOT 1.3 (H) 09/28/2022   ALKPHOS 70 09/28/2022   AST 39 (H) 09/28/2022   ALT 62 (H) 09/28/2022   ANIONGAP 9 12/07/2021      H/o fatty liver  Lab Results  Component Value Date   ALT 62 (H) 09/28/2022   AST 39 (H) 09/28/2022   ALKPHOS 70 09/28/2022   BILITOT 1.3 (H) 09/28/2022  Ast and alt are stable  Drinks one beer per month  No tylenol lately     Prediabetes-now diabetes  Lab Results  Component Value Date   HGBA1C 7.9 (H) 09/28/2022  This is up from 7.0  Did not f/u for this  as planned   He was treated with prednisone in jan for wheezing  Glucose 93 fasting   He checks at home   After waking up/ fasting  - about 130s usually   After work  -usually 119-135  As low as 88 at times   Mother's side of family - all diabetic       Hyperlipidemia Lab Results  Component Value Date   CHOL 145 09/28/2022   CHOL 132 02/11/2021   CHOL 146 02/09/2020   Lab Results  Component Value Date   HDL 37.60 (L) 09/28/2022   HDL 35.00 (L) 02/11/2021   HDL 41.30 02/09/2020   Lab Results  Component Value Date   LDLCALC 80 09/28/2022   [place] 77 02/11/2021   [place] 79 02/09/2020   Lab Results  Component Value Date   TRIG 138.0 09/28/2022   TRIG 97.0 02/11/2021   TRIG 132.0 02/09/2020   Lab Results  Component Value Date   CHOLHDL 4 09/28/2022   CHOLHDL 4 02/11/2021   CHOLHDL 4 02/09/2020   Lab Results  Component Value Date   LDLDIRECT 115.6 11/17/2010   LDLDIRECT 121.8  07/17/2007   LDLDIRECT 100.5 01/03/2007    Patient Active Problem List   Diagnosis Date Noted   Pseudopolyposis of colon without complication, unspecified part of colon ([place]) 10/09/2022   BPH (benign prostatic hyperplasia) 02/16/2021   Diabetes mellitus type 2, controlled ([place]) 03/11/2016   Prostate cancer screening 12/09/2014   Steatohepatitis, non-alcoholic [id number]   Colon cancer screening 05/31/2012   Low serum testosterone level 11/28/2011   Routine general medical examination at a health care facility 11/23/2011   Family history of non-anemic vitamin B12 deficiency 05/22/2011   ERECTILE DYSFUNCTION 05/26/2008   Class 1 obesity due to excess calories with body mass index (BMI) of 32.0 to 32.9 in adult 01/03/2007   Hyperlipidemia 01/01/2007   Essential hypertension 01/01/2007   DIVERTICULOSIS, COLON 01/01/2007   Past Medical History:  Diagnosis Date   Allergy    mold,rag weed,   Asthma    Diverticulitis    colon    ED (erectile dysfunction)    Family history of non-anemic vitamin B12 deficiency 05/22/2011   Hyperlipidemia    Hypertension    Low testosterone    NASH (nonalcoholic steatohepatitis)    Obesity    Seasonal allergies    Sleep apnea    not used c pap in 6 month,updated 05/30/22   Past Surgical History:  Procedure Laterality Date   COLONOSCOPY     EXTRACORPOREAL SHOCK WAVE LITHOTRIPSY Left 12/22/2021   Procedure: EXTRACORPOREAL SHOCK WAVE LITHOTRIPSY (ESWL);  Surgeon: Royston Cowper, MD;  Location: [facility];  Service: Urology;  Laterality: Left;   EXTRACORPOREAL SHOCK WAVE LITHOTRIPSY Left 05/11/2022   Procedure: EXTRACORPOREAL SHOCK WAVE LITHOTRIPSY (ESWL);  Surgeon: Royston Cowper, MD;  Location: [facility];  Service: Urology;  Laterality: Left;   HERNIA REPAIR  07/17/1998   R & L   VASECTOMY  07/17/1997   Social History   Tobacco Use   Smoking status: Former    Types: Cigarettes    Passive exposure: Never   Smokeless tobacco: Never    Tobacco comments:    age 64 for 1 year   Vaping Use   Vaping Use: Never used  Substance Use Topics   Alcohol use: Yes    Comment: very rarely   Drug use: No   Family History  Problem Relation Age of Onset   Diabetes Mother  COPD Mother    Benign prostatic hyperplasia Father    Hypertension Father    Prostate cancer Father    Breast cancer Sister 35       died at 32   Prostate cancer Maternal Grandfather    Colon cancer Neg Hx    Esophageal cancer Neg Hx    Liver disease Neg Hx    Stomach cancer Neg Hx    Pancreatic cancer Neg Hx    Colon polyps Neg Hx    Crohn's disease Neg Hx    Rectal cancer Neg Hx    Ulcerative colitis Neg Hx    Allergies  Allergen Reactions   Milk (Cow) Other (See Comments)    Any Milk products...Marland KitchenRunny nose, cough   Viagra [Sildenafil Citrate]     Leg pain   Current Outpatient Medications on File Prior to Visit  Medication Sig Dispense Refill   albuterol (VENTOLIN HFA) 108 (90 Base) MCG/ACT inhaler Inhale 1-2 puffs into the lungs every 6 (six) hours as needed. 18 g 0   cetirizine (ZYRTEC) 10 MG tablet Take 10 mg by mouth daily as needed for allergies.     CIALIS 5 MG tablet TAKE 1 TABLET (5 MG TOTAL) BY MOUTH DAILY AS NEEDED FOR ERECTILE DYSFUNCTION. 10 tablet 5   fluticasone (FLONASE) 50 MCG/ACT nasal spray Place 2 sprays into both nostrils daily as needed.   19   losartan (COZAAR) 100 MG tablet TAKE 1 TABLET BY MOUTH EVERY DAY 90 tablet 0   Multiple Vitamin (DAILY MULTIVITAMIN PO) Take 1 tablet by mouth daily.     Omega-3 Fatty Acids (FISH OIL) 1200 MG CAPS Take by mouth daily.     ONETOUCH DELICA LANCETS 99991111 MISC Check blood sugar twice a day and as directed. Dx E11.9 100 each 3   SYMBICORT 80-4.5 MCG/ACT inhaler Inhale 1 puff into the lungs daily as needed.     tamsulosin (FLOMAX) 0.4 MG CAPS capsule Take 1 capsule (0.4 mg total) by mouth daily. 30 capsule 11   Current Facility-Administered Medications on File Prior to Visit  Medication  Dose Route Frequency Provider Last Rate Last Admin   0.9 %  sodium chloride infusion  500 mL Intravenous Once Pyrtle, Lajuan Lines, MD        Review of Systems  Constitutional:  Positive for fatigue. Negative for activity change, appetite change, fever and unexpected weight change.  HENT:  Negative for congestion, rhinorrhea, sore throat and trouble swallowing.   Eyes:  Negative for pain, redness, itching and visual disturbance.  Respiratory:  Negative for cough, chest tightness, shortness of breath and wheezing.   Cardiovascular:  Negative for chest pain and palpitations.  Gastrointestinal:  Negative for abdominal pain, blood in stool, constipation, diarrhea and nausea.  Endocrine: Negative for cold intolerance, heat intolerance, polydipsia and polyuria.  Genitourinary:  Negative for difficulty urinating, dysuria, frequency and urgency.  Musculoskeletal:  Negative for arthralgias, joint swelling and myalgias.  Skin:  Negative for pallor and rash.  Neurological:  Negative for dizziness, tremors, weakness, numbness and headaches.  Hematological:  Negative for adenopathy. Does not bruise/bleed easily.  Psychiatric/Behavioral:  Negative for decreased concentration and dysphoric mood. The patient is not nervous/anxious.        Objective:   Physical Exam Constitutional:      General: He is not in acute distress.    Appearance: Normal appearance. He is well-developed. He is obese. He is not ill-appearing or diaphoretic.  HENT:     Head: Normocephalic and  atraumatic.     Right Ear: Tympanic membrane, ear canal and external ear normal.     Left Ear: Tympanic membrane, ear canal and external ear normal.     Nose: Nose normal. No congestion.     Mouth/Throat:     Mouth: Mucous membranes are moist.     Pharynx: Oropharynx is clear. No posterior oropharyngeal erythema.  Eyes:     General: No scleral icterus.       Right eye: No discharge.        Left eye: No discharge.     Conjunctiva/sclera:  Conjunctivae normal.     Pupils: Pupils are equal, round, and reactive to light.  Neck:     Thyroid: No thyromegaly.     Vascular: No carotid bruit or JVD.  Cardiovascular:     Rate and Rhythm: Normal rate and regular rhythm.     Pulses: Normal pulses.     Heart sounds: Normal heart sounds.     No gallop.  Pulmonary:     Effort: Pulmonary effort is normal. No respiratory distress.     Breath sounds: Normal breath sounds. No wheezing or rales.     Comments: Good air exch Chest:     Chest wall: No tenderness.  Abdominal:     General: Bowel sounds are normal. There is no distension or abdominal bruit.     Palpations: Abdomen is soft. There is no mass.     Tenderness: There is no abdominal tenderness.     Hernia: No hernia is present.  Musculoskeletal:        General: No tenderness.     Cervical back: Normal range of motion and neck supple. No rigidity. No muscular tenderness.     Right lower leg: No edema.     Left lower leg: No edema.  Lymphadenopathy:     Cervical: No cervical adenopathy.  Skin:    General: Skin is warm and dry.     Coloration: Skin is not pale.     Findings: No erythema or rash.     Comments: Solar lentigines diffusely   Neurological:     Mental Status: He is alert.     Cranial Nerves: No cranial nerve deficit.     Motor: No abnormal muscle tone.     Coordination: Coordination normal.     Gait: Gait normal.     Deep Tendon Reflexes: Reflexes are normal and symmetric. Reflexes normal.  Psychiatric:        Mood and Affect: Mood normal.        Cognition and Memory: Cognition and memory normal.           Assessment & Plan:   Problem List Items Addressed This Visit       Cardiovascular and Mediastinum   Essential hypertension    bp in fair control at this time  Back today after losing insurance for a while BP Readings from Last 1 Encounters:  10/09/22 118/60  No changes needed Most recent labs reviewed  Disc lifstyle change with low sodium  diet and exercise  Plan to continue losartan 100 mg daily       Relevant Medications   EPIPEN 2-PAK 0.3 MG/0.3ML SOAJ injection     Digestive   Pseudopolyposis of colon without complication, unspecified part of colon ([place])    Colonoscopy 06/2022 with recall in 5 y      Steatohepatitis, non-alcoholic    Stable liver tests No etoh or tylenol Working hard on wt loss -  commended  No symptoms Will continue to follow        Endocrine   Diabetes mellitus type 2, controlled ([place])    Lab Results  Component Value Date   HGBA1C 7.9 (H) 09/28/2022  This reflected a course of steroids from uri  Now has started low glycemic diet and lost 13 lb-commended  Fasting glucose 93 At home his am and pp readings are good recently  He would rather avoid dm med until we re check in 3 mo with lifestyle change  Urine microalb ordered today  Eye exam utd-sent for report   Fam hx of DM2  Enc continued low glycemic diet Add exercise as able   F/u 3 mo  Will discuss statin at that time       Relevant Orders   Microalbumin / creatinine urine ratio   Hyperlipidemia associated with type 2 diabetes mellitus ([facility])    Disc goals for lipids and reasons to control them Rev last labs with pt Rev low sat fat diet in detail Enc exercise to help HDL If glucose stays in DM range will have to discuss statin  LDL is80 this check        Relevant Medications   EPIPEN 2-PAK 0.3 MG/0.3ML SOAJ injection     Genitourinary   BPH (benign prostatic hyperplasia)    Continues urology care Lab Results  Component Value Date   PSA 1.10 09/28/2022   PSA 1.10 02/11/2021   PSA 0.70 02/09/2020           Other   Class 1 obesity due to excess calories with body mass index (BMI) of 32.0 to 32.9 in adult    Discussed how this problem influences overall health and the risks it imposes  Reviewed plan for weight loss with lower calorie diet (via better food choices and also portion control or program like weight  watchers) and exercise building up to or more than 30 minutes 5 days per week including some aerobic activity   Commended on wt loss so far      Colon cancer screening    Colonoscopy 06/2022 with 5 y recall      Prostate cancer screening    Lab Results  Component Value Date   PSA 1.10 09/28/2022   PSA 1.10 02/11/2021   PSA 0.70 02/09/2020   Father had prostate cancer in 67s Pt sees urology      Routine general medical examination at a health care facility - Primary    Reviewed health habits including diet and exercise and skin cancer prevention Reviewed appropriate screening tests for age  Also reviewed health mt list, fam hx and immunization status , as well as social and family history   See HPI Labs reviewed Eye exam utd-sent for  U microalb ordered today  Declines shingrix vaccine  Colonoscopy utd 06/2022 with 5 y recall Psa utd/ also sees urology

## 2022-10-09 NOTE — Assessment & Plan Note (Signed)
Stable liver tests No etoh or tylenol Working hard on wt loss -commended  No symptoms Will continue to follow

## 2022-10-09 NOTE — Assessment & Plan Note (Signed)
Disc goals for lipids and reasons to control them Rev last labs with pt Rev low sat fat diet in detail Enc exercise to help HDL If glucose stays in DM range will have to discuss statin  LDL is80 this check

## 2022-10-09 NOTE — Assessment & Plan Note (Signed)
bp in fair control at this time  Back today after losing insurance for a while BP Readings from Last 1 Encounters:  10/09/22 118/60   No changes needed Most recent labs reviewed  Disc lifstyle change with low sodium diet and exercise  Plan to continue losartan 100 mg daily

## 2022-10-09 NOTE — Patient Instructions (Signed)
Add some strength training to your routine, this is important for bone and brain health and can reduce your risk of falls and help your body use insulin properly and regulate weight  Light weights, exercise bands , and internet videos are a good way to start  Yoga (chair or regular), machines , floor exercises or a gym with machines are also good options    Conitnue the low glycemic diet Try to get most of your carbohydrates from produce (with the exception of white potatoes)  Eat less bread/pasta/rice/snack foods/cereals/sweets and other items from the middle of the grocery store (processed carbs)  Let's check a urine protein test today for diabetes    Follow up in 3 months

## 2022-10-09 NOTE — Assessment & Plan Note (Signed)
Lab Results  Component Value Date   PSA 1.10 09/28/2022   PSA 1.10 02/11/2021   PSA 0.70 02/09/2020    Father had prostate cancer in 17s Pt sees urology

## 2022-10-09 NOTE — Assessment & Plan Note (Signed)
Reviewed health habits including diet and exercise and skin cancer prevention Reviewed appropriate screening tests for age  Also reviewed health mt list, fam hx and immunization status , as well as social and family history   See HPI Labs reviewed Eye exam utd-sent for  U microalb ordered today  Declines shingrix vaccine  Colonoscopy utd 06/2022 with 5 y recall Psa utd/ also sees urology

## 2022-10-09 NOTE — Assessment & Plan Note (Signed)
Discussed how this problem influences overall health and the risks it imposes  Reviewed plan for weight loss with lower calorie diet (via better food choices and also portion control or program like weight watchers) and exercise building up to or more than 30 minutes 5 days per week including some aerobic activity   Commended on wt loss so far  

## 2022-10-09 NOTE — Assessment & Plan Note (Addendum)
Lab Results  Component Value Date   HGBA1C 7.9 (H) 09/28/2022   This reflected a course of steroids from uri  Now has started low glycemic diet and lost 13 lb-commended  Fasting glucose 93 At home his am and pp readings are good recently  He would rather avoid dm med until we re check in 3 mo with lifestyle change  Urine microalb ordered today  Eye exam utd-sent for report   Fam hx of DM2  Enc continued low glycemic diet Add exercise as able   F/u 3 mo  Will discuss statin at that time

## 2022-10-10 LAB — MICROALBUMIN / CREATININE URINE RATIO
Creatinine,U: 152.7 mg/dL
Microalb Creat Ratio: 3.1 mg/g (ref 0.0–30.0)
Microalb, Ur: 4.7 mg/dL — ABNORMAL HIGH (ref 0.0–1.9)

## 2022-12-03 ENCOUNTER — Other Ambulatory Visit: Payer: Self-pay | Admitting: Family Medicine

## 2022-12-20 ENCOUNTER — Ambulatory Visit
Admission: RE | Admit: 2022-12-20 | Discharge: 2022-12-20 | Disposition: A | Discharge status: Home / Self Care | Payer: 59 | Source: Ambulatory Visit | Attending: Emergency Medicine | Admitting: Emergency Medicine

## 2022-12-20 VITALS — BP 140/67 | HR 72 | Temp 99.3°F | Resp 16

## 2022-12-20 DIAGNOSIS — J45901 Unspecified asthma with (acute) exacerbation: Secondary | ICD-10-CM

## 2022-12-20 MED ORDER — ALBUTEROL SULFATE HFA 108 (90 BASE) MCG/ACT IN AERS: 1.0000 | INHALATION_SPRAY | Freq: Four times a day (QID) | RESPIRATORY_TRACT | 0 refills | Status: DC | PRN

## 2022-12-20 MED ORDER — PROMETHAZINE-DM 6.25-15 MG/5ML PO SYRP: 5.0000 mL | ORAL_SOLUTION | Freq: Four times a day (QID) | ORAL | 0 refills | Status: DC | PRN

## 2022-12-20 MED ORDER — PREDNISONE 10 MG PO TABS: 40.0000 mg | ORAL_TABLET | Freq: Every day | ORAL | 0 refills | Status: AC

## 2022-12-20 NOTE — Discharge Instructions (Addendum)
Use the albuterol inhaler as directed.  Take the prednisone as directed.  Monitor your blood sugar closely while taking prednisone.    Take the promethazine DM as directed.  Do not drive, operate machinery, drink alcohol, or perform dangerous activities while taking this medication as it may cause drowsiness.  Follow up with your primary care provider.

## 2022-12-20 NOTE — ED Provider Notes (Signed)
[facility]    CSN: [id number] Arrival date & time: 12/20/22  1318      History   Chief Complaint Chief Complaint  Patient presents with   Nasal Congestion    Sinus infection and cough - Entered by patient   Cough    HPI Terry Dixon is a 62 y.o. male.  Patient presents with 4-day history of congestion, cough, wheezing.  His symptoms are worse at night.  His cough is productive of yellow mucus.  He denies fever, shortness of breath, chest pain, or other symptoms.  Treatment attempted with Mucinex and Zyrtec.  His medical history includes asthma, hypertension, allergies, diabetes.  The history is provided by the patient and medical records.    Past Medical History:  Diagnosis Date   Allergy    mold,rag weed,   Asthma    Diverticulitis    colon    ED (erectile dysfunction)    Family history of non-anemic vitamin B12 deficiency 05/22/2011   Hyperlipidemia    Hypertension    Low testosterone    NASH (nonalcoholic steatohepatitis)    Obesity    Seasonal allergies    Sleep apnea    not used c pap in 6 month,updated 05/30/22    Patient Active Problem List   Diagnosis Date Noted   Pseudopolyposis of colon without complication, unspecified part of colon (HCC) 10/09/2022   BPH (benign prostatic hyperplasia) 02/16/2021   Diabetes mellitus type 2, controlled (HCC) 03/11/2016   Prostate cancer screening 12/09/2014   Steatohepatitis, non-alcoholic 08/06/2013   Colon cancer screening 05/31/2012   Low serum testosterone level 11/28/2011   Routine general medical examination at a health care facility 11/23/2011   Family history of non-anemic vitamin B12 deficiency 05/22/2011   ERECTILE DYSFUNCTION 05/26/2008   Class 1 obesity due to excess calories with body mass index (BMI) of 32.0 to 32.9 in adult 01/03/2007   Hyperlipidemia associated with type 2 diabetes mellitus (HCC) 01/01/2007   Essential hypertension 01/01/2007   DIVERTICULOSIS, COLON 01/01/2007     Past Surgical History:  Procedure Laterality Date   COLONOSCOPY     EXTRACORPOREAL SHOCK WAVE LITHOTRIPSY Left 12/22/2021   Procedure: EXTRACORPOREAL SHOCK WAVE LITHOTRIPSY (ESWL);  Surgeon: Orson Ape, MD;  Location: [facility];  Service: Urology;  Laterality: Left;   EXTRACORPOREAL SHOCK WAVE LITHOTRIPSY Left 05/11/2022   Procedure: EXTRACORPOREAL SHOCK WAVE LITHOTRIPSY (ESWL);  Surgeon: Orson Ape, MD;  Location: [facility];  Service: Urology;  Laterality: Left;   HERNIA REPAIR  07/17/1998   R & L   VASECTOMY  07/17/1997       Home Medications    Prior to Admission medications   Medication Sig Start Date End Date Taking? Authorizing Provider  albuterol (VENTOLIN HFA) 108 (90 Base) MCG/ACT inhaler Inhale 1-2 puffs into the lungs every 6 (six) hours as needed. 12/20/22  Yes Mickie Bail, NP  cetirizine (ZYRTEC) 10 MG tablet Take 10 mg by mouth daily as needed for allergies.   Yes [provider]  fluticasone (FLONASE) 50 MCG/ACT nasal spray Place 2 sprays into both nostrils daily as needed.  06/09/14  Yes [provider]  glucose blood (ONETOUCH ULTRA) test strip CHECK BLOOD SUGAR TWICE A DAY AND AS DIRECTED. DX E11.9 10/09/22  Yes Tower, Audrie Gallus, MD  losartan (COZAAR) 100 MG tablet TAKE 1 TABLET BY MOUTH EVERY DAY 12/04/22  Yes Tower, Audrie Gallus, MD  Multiple Vitamin (DAILY MULTIVITAMIN PO) Take 1 tablet by mouth daily.  Yes [provider]  Omega-3 Fatty Acids (FISH OIL) 1200 MG CAPS Take by mouth daily.   Yes [provider]  [facility] DELICA LANCETS 33G MISC Check blood sugar twice a day and as directed. Dx E11.9 09/12/16  Yes Tower, Audrie Gallus, MD  predniSONE (DELTASONE) 10 MG tablet Take 4 tablets (40 mg total) by mouth daily for 5 days. 12/20/22 12/25/22 Yes Mickie Bail, NP  promethazine-dextromethorphan (PROMETHAZINE-DM) 6.25-15 MG/5ML syrup Take 5 mLs by mouth 4 (four) times daily as needed. 12/20/22  Yes Mickie Bail, NP  SYMBICORT 80-4.5  MCG/ACT inhaler Inhale 1 puff into the lungs daily as needed. 10/31/17  Yes [provider]  tamsulosin (FLOMAX) 0.4 MG CAPS capsule Take 1 capsule (0.4 mg total) by mouth daily. 05/11/22  Yes Orson Ape, MD  CIALIS 5 MG tablet TAKE 1 TABLET (5 MG TOTAL) BY MOUTH DAILY AS NEEDED FOR ERECTILE DYSFUNCTION. 04/03/18   Tower, Audrie Gallus, MD  EPIPEN 2-PAK 0.3 MG/0.3ML SOAJ injection Inject 0.3 mg into the muscle as needed for anaphylaxis. Reported on 07/06/2015-use as needed 10/09/22   Tower, Audrie Gallus, MD    Family History Family History  Problem Relation Age of Onset   Diabetes Mother    COPD Mother    Benign prostatic hyperplasia Father    Hypertension Father    Prostate cancer Father    Breast cancer Sister 76       died at 71   Prostate cancer Maternal Grandfather    Colon cancer Neg Hx    Esophageal cancer Neg Hx    Liver disease Neg Hx    Stomach cancer Neg Hx    Pancreatic cancer Neg Hx    Colon polyps Neg Hx    Crohn's disease Neg Hx    Rectal cancer Neg Hx    Ulcerative colitis Neg Hx     Social History Social History   Tobacco Use   Smoking status: Former    Types: Cigarettes    Passive exposure: Never   Smokeless tobacco: Never   Tobacco comments:    age 41 for 1 year   Vaping Use   Vaping Use: Never used  Substance Use Topics   Alcohol use: Yes    Comment: very rarely   Drug use: No     Allergies   Milk (cow) and Viagra [sildenafil citrate]   Review of Systems Review of Systems  Constitutional:  Negative for chills and fever.  HENT:  Positive for congestion. Negative for ear pain and sore throat.   Respiratory:  Positive for cough and wheezing. Negative for shortness of breath.   Cardiovascular:  Negative for chest pain and palpitations.  Gastrointestinal:  Negative for diarrhea and vomiting.     Physical Exam Triage Vital Signs ED Triage Vitals  Enc Vitals Group     BP      Pulse      Resp      Temp      Temp src      SpO2       Weight      Height      Head Circumference      Peak Flow      Pain Score      Pain Loc      Pain Edu?      Excl. in GC?    No data found.  Updated Vital Signs BP (!) 140/67 (BP Location: Left Arm)   Pulse 72  Temp 99.3 F (37.4 C) (Oral)   Resp 16   SpO2 96%   Visual Acuity Right Eye Distance:   Left Eye Distance:   Bilateral Distance:    Right Eye Near:   Left Eye Near:    Bilateral Near:     Physical Exam Vitals and nursing note reviewed.  Constitutional:      General: He is not in acute distress.    Appearance: Normal appearance. He is well-developed. He is not ill-appearing.  HENT:     Right Ear: Tympanic membrane normal.     Left Ear: Tympanic membrane normal.     Nose: Nose normal.     Mouth/Throat:     Mouth: Mucous membranes are moist.     Pharynx: Oropharynx is clear.  Cardiovascular:     Rate and Rhythm: Normal rate and regular rhythm.     Heart sounds: Normal heart sounds.  Pulmonary:     Effort: Pulmonary effort is normal. No respiratory distress.     Breath sounds: No wheezing.     Comments: Tight cough during exam. Musculoskeletal:     Cervical back: Neck supple.  Skin:    General: Skin is warm and dry.  Neurological:     Mental Status: He is alert.  Psychiatric:        Mood and Affect: Mood normal.        Behavior: Behavior normal.      UC Treatments / Results  Labs (all labs ordered are listed, but only abnormal results are displayed) Labs Reviewed - No data to display  EKG   Radiology No results found.  Procedures Procedures (including critical care time)  Medications Ordered in UC Medications - No data to display  Initial Impression / Assessment and Plan / UC Course  I have reviewed the triage vital signs and the nursing notes.  Pertinent labs & imaging results that were available during my care of the patient were reviewed by me and considered in my medical decision making (see chart for details).   Asthma  exacerbation.  Lungs are clear at this time but patient reports the wheezing mostly occurs at night.  He has a tight cough during exam today.  Treating with albuterol inhaler, prednisone, Promethazine DM.  Cautioned patient to monitor his blood sugar closely while taking prednisone.  Precautions for drowsiness with Promethazine DM discussed.  Instructed patient to schedule an appointment with his PCP for follow-up.  Education provided on asthma.  He agrees to plan of care.   Final Clinical Impressions(s) / UC Diagnoses   Final diagnoses:  Asthma with acute exacerbation, unspecified asthma severity, unspecified whether persistent     Discharge Instructions      Use the albuterol inhaler as directed.  Take the prednisone as directed.  Monitor your blood sugar closely while taking prednisone.    Take the promethazine DM as directed.  Do not drive, operate machinery, drink alcohol, or perform dangerous activities while taking this medication as it may cause drowsiness.  Follow up with your primary care provider.           ED Prescriptions     Medication Sig Dispense Auth. Provider   predniSONE (DELTASONE) 10 MG tablet Take 4 tablets (40 mg total) by mouth daily for 5 days. 20 tablet Mickie Bail, NP   promethazine-dextromethorphan (PROMETHAZINE-DM) 6.25-15 MG/5ML syrup Take 5 mLs by mouth 4 (four) times daily as needed. 118 mL Mickie Bail, NP   albuterol (VENTOLIN HFA) 108 (  90 Base) MCG/ACT inhaler Inhale 1-2 puffs into the lungs every 6 (six) hours as needed. 18 g Mickie Bail, NP      PDMP not reviewed this encounter.   Mickie Bail, NP 12/20/22 1416

## 2022-12-20 NOTE — ED Triage Notes (Signed)
Cough, chest congestion, sinus drainage with yellow mucus. That started Sunday. Taking mucinx and zyrtec with no relief.

## 2023-01-09 ENCOUNTER — Encounter: Payer: Self-pay | Admitting: Family Medicine

## 2023-01-09 ENCOUNTER — Ambulatory Visit: Payer: 59 | Admitting: Family Medicine

## 2023-01-09 VITALS — BP 124/74 | HR 59 | Temp 97.8°F | Ht 68.5 in | Wt 218.8 lb

## 2023-01-09 DIAGNOSIS — E119 Type 2 diabetes mellitus without complications: Secondary | ICD-10-CM

## 2023-01-09 DIAGNOSIS — I10 Essential (primary) hypertension: Secondary | ICD-10-CM | POA: Diagnosis not present

## 2023-01-09 DIAGNOSIS — E785 Hyperlipidemia, unspecified: Secondary | ICD-10-CM | POA: Diagnosis not present

## 2023-01-09 DIAGNOSIS — E1169 Type 2 diabetes mellitus with other specified complication: Secondary | ICD-10-CM | POA: Diagnosis not present

## 2023-01-09 DIAGNOSIS — Z7984 Long term (current) use of oral hypoglycemic drugs: Secondary | ICD-10-CM

## 2023-01-09 LAB — POCT GLYCOSYLATED HEMOGLOBIN (HGB A1C)
HbA1c POC (<> result, manual entry): 7.2 % (ref 4.0–5.6)
HbA1c, POC (controlled diabetic range): 7.2 % — AB (ref 0.0–7.0)
HbA1c, POC (prediabetic range): 7.2 % — AB (ref 5.7–6.4)
Hemoglobin A1C: 7.2 % — AB (ref 4.0–5.6)

## 2023-01-09 MED ORDER — METFORMIN HCL ER 500 MG PO TB24: 500.0000 mg | ORAL_TABLET | Freq: Every day | ORAL | 1 refills | Status: DC

## 2023-01-09 NOTE — Assessment & Plan Note (Signed)
bp in fair control at this time  Back today after losing insurance for a while BP Readings from Last 1 Encounters:  01/09/23 124/74   No changes needed Most recent labs reviewed  Disc lifstyle change with low sodium diet and exercise  Plan to continue losartan 100 mg daily

## 2023-01-09 NOTE — Progress Notes (Signed)
Subjective:    Patient ID: Terry Dixon, male    DOB: September 22, 1960, 62 y.o.   MRN: [id number]  HPI  Wt Readings from Last 3 Encounters:  01/09/23 218 lb 12.8 oz (99.2 kg)  10/09/22 220 lb (99.8 kg)  07/03/22 233 lb (105.7 kg)   32.78 kg/m  Vitals:   01/09/23 1359  BP: 124/74  Pulse: (!) 59  Temp: 97.8 F (36.6 C)  SpO2: 95%    Pt presents for follow up of DM2 and chronic medical problems     DM2 Lab Results  Component Value Date   HGBA1C 7.2 (A) 01/09/2023   HGBA1C 7.2 01/09/2023   HGBA1C 7.2 (A) 01/09/2023   HGBA1C 7.2 (A) 01/09/2023   At last DM visit noted he had steroids for uri in 3 mo prior   Then started low glycemic diet and lost weight and glucose readings improved  No medication - wanted to try diet control further   Improved at 7.2 Unfortunately had to have steroids again for asthma     Also working very long hours  Working 7 d per week 12 hours daily  Does love his job - but hours are terrible    No time for exercise but job is active (walks a lot) Tries to watch his diet - is down another pounds      Lab Results  Component Value Date   MICROALBUR 4.7 (H) 10/09/2022  Ratio of 3.1  normal   Hyperlipidemia  Lab Results  Component Value Date   CHOL 145 09/28/2022   HDL 37.60 (L) 09/28/2022   LDLCALC 80 09/28/2022   LDLDIRECT 115.6 11/17/2010   TRIG 138.0 09/28/2022   CHOLHDL 4 09/28/2022    HTN bp is stable today  No cp or palpitations or headaches or edema  No side effects to medicines  BP Readings from Last 3 Encounters:  01/09/23 124/74  12/20/22 (!) 140/67  10/09/22 118/60     Losartan 100 mg daily   Lab Results  Component Value Date   NA 136 09/28/2022   K 3.9 09/28/2022   CO2 25 09/28/2022   GLUCOSE 93 09/28/2022   BUN 16 09/28/2022   CREATININE 0.90 09/28/2022   CALCIUM 8.9 09/28/2022   GFR 92.73 09/28/2022   GFRNONAA >60 12/07/2021        Patient Active Problem List   Diagnosis Date Noted    Pseudopolyposis of colon without complication, unspecified part of colon (HCC) 10/09/2022   BPH (benign prostatic hyperplasia) 02/16/2021   Diabetes mellitus type 2, controlled (HCC) 03/11/2016   Prostate cancer screening 12/09/2014   Steatohepatitis, non-alcoholic 08/06/2013   Colon cancer screening 05/31/2012   Low serum testosterone level 11/28/2011   Routine general medical examination at a health care facility 11/23/2011   Family history of non-anemic vitamin B12 deficiency 05/22/2011   ERECTILE DYSFUNCTION 05/26/2008   Class 1 obesity due to excess calories with body mass index (BMI) of 32.0 to 32.9 in adult 01/03/2007   Hyperlipidemia associated with type 2 diabetes mellitus (HCC) 01/01/2007   Essential hypertension 01/01/2007   DIVERTICULOSIS, COLON 01/01/2007   Past Medical History:  Diagnosis Date   Allergy    mold,rag weed,   Asthma    Diverticulitis    colon    ED (erectile dysfunction)    Family history of non-anemic vitamin B12 deficiency 05/22/2011   Hyperlipidemia    Hypertension    Low testosterone    NASH (nonalcoholic steatohepatitis)    Obesity  Seasonal allergies    Sleep apnea    not used c pap in 6 month,updated 05/30/22   Past Surgical History:  Procedure Laterality Date   COLONOSCOPY     EXTRACORPOREAL SHOCK WAVE LITHOTRIPSY Left 12/22/2021   Procedure: EXTRACORPOREAL SHOCK WAVE LITHOTRIPSY (ESWL);  Surgeon: Orson Ape, MD;  Location: [facility];  Service: Urology;  Laterality: Left;   EXTRACORPOREAL SHOCK WAVE LITHOTRIPSY Left 05/11/2022   Procedure: EXTRACORPOREAL SHOCK WAVE LITHOTRIPSY (ESWL);  Surgeon: Orson Ape, MD;  Location: [facility];  Service: Urology;  Laterality: Left;   HERNIA REPAIR  07/17/1998   R & L   VASECTOMY  07/17/1997   Social History   Tobacco Use   Smoking status: Former    Types: Cigarettes    Passive exposure: Never   Smokeless tobacco: Never   Tobacco comments:    age 4 for 1 year   Vaping Use    Vaping Use: Never used  Substance Use Topics   Alcohol use: Yes    Comment: very rarely   Drug use: No   Family History  Problem Relation Age of Onset   Diabetes Mother    COPD Mother    Benign prostatic hyperplasia Father    Hypertension Father    Prostate cancer Father    Breast cancer Sister 54       died at 75   Prostate cancer Maternal Grandfather    Colon cancer Neg Hx    Esophageal cancer Neg Hx    Liver disease Neg Hx    Stomach cancer Neg Hx    Pancreatic cancer Neg Hx    Colon polyps Neg Hx    Crohn's disease Neg Hx    Rectal cancer Neg Hx    Ulcerative colitis Neg Hx    Allergies  Allergen Reactions   Milk (Cow) Other (See Comments)    Any Milk products...Marland KitchenRunny nose, cough   Viagra [Sildenafil Citrate]     Leg pain   Current Outpatient Medications on File Prior to Visit  Medication Sig Dispense Refill   albuterol (VENTOLIN HFA) 108 (90 Base) MCG/ACT inhaler Inhale 1-2 puffs into the lungs every 6 (six) hours as needed. 18 g 0   cetirizine (ZYRTEC) 10 MG tablet Take 10 mg by mouth daily as needed for allergies.     CIALIS 5 MG tablet TAKE 1 TABLET (5 MG TOTAL) BY MOUTH DAILY AS NEEDED FOR ERECTILE DYSFUNCTION. 10 tablet 5   fluticasone (FLONASE) 50 MCG/ACT nasal spray Place 2 sprays into both nostrils daily as needed.   19   glucose blood (ONETOUCH ULTRA) test strip CHECK BLOOD SUGAR TWICE A DAY AND AS DIRECTED. DX E11.9 100 each 3   losartan (COZAAR) 100 MG tablet TAKE 1 TABLET BY MOUTH EVERY DAY 90 tablet 0   Multiple Vitamin (DAILY MULTIVITAMIN PO) Take 1 tablet by mouth daily.     Omega-3 Fatty Acids (FISH OIL) 1200 MG CAPS Take by mouth daily.     ONETOUCH DELICA LANCETS 33G MISC Check blood sugar twice a day and as directed. Dx E11.9 100 each 3   SYMBICORT 80-4.5 MCG/ACT inhaler Inhale 1 puff into the lungs daily as needed.     tamsulosin (FLOMAX) 0.4 MG CAPS capsule Take 1 capsule (0.4 mg total) by mouth daily. 30 capsule 11   EPIPEN 2-PAK 0.3 MG/0.3ML  SOAJ injection Inject 0.3 mg into the muscle as needed for anaphylaxis. Reported on 07/06/2015-use as needed (Patient not taking: Reported on 01/09/2023) 1  each 11   promethazine-dextromethorphan (PROMETHAZINE-DM) 6.25-15 MG/5ML syrup Take 5 mLs by mouth 4 (four) times daily as needed. (Patient not taking: Reported on 01/09/2023) 118 mL 0   No current facility-administered medications on file prior to visit.    Review of Systems  Constitutional:  Positive for fatigue. Negative for activity change, appetite change, fever and unexpected weight change.       Due to long work hours   HENT:  Negative for congestion, rhinorrhea, sore throat and trouble swallowing.   Eyes:  Negative for pain, redness, itching and visual disturbance.  Respiratory:  Negative for cough, chest tightness, shortness of breath and wheezing.   Cardiovascular:  Negative for chest pain and palpitations.  Gastrointestinal:  Negative for abdominal pain, blood in stool, constipation, diarrhea and nausea.  Endocrine: Negative for cold intolerance, heat intolerance, polydipsia and polyuria.  Genitourinary:  Negative for difficulty urinating, dysuria, frequency and urgency.  Musculoskeletal:  Negative for arthralgias, joint swelling and myalgias.  Skin:  Negative for pallor and rash.  Neurological:  Negative for dizziness, tremors, weakness, numbness and headaches.  Hematological:  Negative for adenopathy. Does not bruise/bleed easily.  Psychiatric/Behavioral:  Negative for decreased concentration and dysphoric mood. The patient is not nervous/anxious.        Objective:   Physical Exam Constitutional:      General: He is not in acute distress.    Appearance: Normal appearance. He is well-developed. He is obese. He is not ill-appearing or diaphoretic.  HENT:     Head: Normocephalic and atraumatic.  Eyes:     Conjunctiva/sclera: Conjunctivae normal.     Pupils: Pupils are equal, round, and reactive to light.  Neck:      Thyroid: No thyromegaly.     Vascular: No carotid bruit or JVD.  Cardiovascular:     Rate and Rhythm: Normal rate and regular rhythm.     Heart sounds: Normal heart sounds.     No gallop.  Pulmonary:     Effort: Pulmonary effort is normal. No respiratory distress.     Breath sounds: Normal breath sounds. No wheezing or rales.  Abdominal:     General: There is no distension or abdominal bruit.     Palpations: Abdomen is soft.  Musculoskeletal:     Cervical back: Normal range of motion and neck supple.     Right lower leg: No edema.     Left lower leg: No edema.  Lymphadenopathy:     Cervical: No cervical adenopathy.  Skin:    General: Skin is warm and dry.     Coloration: Skin is not pale.     Findings: No rash.  Neurological:     Mental Status: He is alert.     Coordination: Coordination normal.     Deep Tendon Reflexes: Reflexes are normal and symmetric. Reflexes normal.  Psychiatric:        Mood and Affect: Mood normal.           Assessment & Plan:   Problem List Items Addressed This Visit       Cardiovascular and Mediastinum   Essential hypertension    bp in fair control at this time  Back today after losing insurance for a while BP Readings from Last 1 Encounters:  01/09/23 124/74  No changes needed Most recent labs reviewed  Disc lifstyle change with low sodium diet and exercise  Plan to continue losartan 100 mg daily       Relevant Orders   Basic  metabolic panel     Endocrine   Hyperlipidemia associated with type 2 diabetes mellitus (HCC)    Disc goals for lipids and reasons to control them Rev last labs with pt Rev low sat fat diet in detail  Labs planned for 3 mo and then follow up  Will discuss statin at that time       Relevant Medications   metFORMIN (GLUCOPHAGE-XR) 500 MG 24 hr tablet   Other Relevant Orders   Lipid panel   Diabetes mellitus type 2, controlled (HCC) - Primary    A1c is 7.2 down from 7.9  Even with recent steroids  again for bronchitis/ asthma  Discussed low glycemic diet  Long work hours-no time for exercise   Prescribed metformin xr 500 mg once daily with fist meal (works pm shift)  Instructed to call if problems or side effects  Follow up in 3 mo with fasting labs prior  Encouraged self care when able  This may help weight loss Microalb utd  Will discuss statin next time       Relevant Medications   metFORMIN (GLUCOPHAGE-XR) 500 MG 24 hr tablet   Other Relevant Orders   POCT glycosylated hemoglobin (Hb A1C) (Completed)   Hemoglobin A1c

## 2023-01-09 NOTE — Assessment & Plan Note (Signed)
Disc goals for lipids and reasons to control them Rev last labs with pt Rev low sat fat diet in detail  Labs planned for 3 mo and then follow up  Will discuss statin at that time

## 2023-01-09 NOTE — Assessment & Plan Note (Signed)
A1c is 7.2 down from 7.9  Even with recent steroids again for bronchitis/ asthma  Discussed low glycemic diet  Long work hours-no time for exercise   Prescribed metformin xr 500 mg once daily with fist meal (works pm shift)  Instructed to call if problems or side effects  Follow up in 3 mo with fasting labs prior  Encouraged self care when able  This may help weight loss Microalb utd  Will discuss statin next time

## 2023-01-09 NOTE — Patient Instructions (Addendum)
Glad your number has improved   Stick to a low glycemic diet Fit in exercise any time you can    Start metformin xr 500 mg daily (with first food of the day when you get up)  If any intolerable side effects, hold it and let me know   Follow up in 3 months with fasting labs prior

## 2023-02-06 ENCOUNTER — Telehealth: Payer: Self-pay | Admitting: Family Medicine

## 2023-02-06 MED ORDER — ALBUTEROL SULFATE HFA 108 (90 BASE) MCG/ACT IN AERS: 1.0000 | INHALATION_SPRAY | Freq: Four times a day (QID) | RESPIRATORY_TRACT | 1 refills | Status: AC | PRN

## 2023-02-06 NOTE — Telephone Encounter (Signed)
Prescription Request  02/06/2023  LOV: 01/09/2023  What is the name of the medication or equipment? albuterol (VENTOLIN HFA) 108 (90 Base) MCG/ACT inhaler [[id number]] & cough meds   Have you contacted your pharmacy to request a refill? No   Which pharmacy would you like this sent to?  [id number] - [place], [place] - [address]     Patient notified that their request is being sent to the clinical staff for review and that they should receive a response within 2 business days.   Please advise at Mobile [phone] (mobile)  Pt states he's currently at [facility] & is having a bad cough. Pt states he forgot his inhaler at home along with the cough meds, Dr Milinda Antis has prescribed before. Pt states he doesn't remember the name of the cough meds.

## 2023-03-01 ENCOUNTER — Encounter (INDEPENDENT_AMBULATORY_CARE_PROVIDER_SITE_OTHER): Payer: Self-pay

## 2023-03-05 IMAGING — CT CT RENAL STONE PROTOCOL
2 of 4 series · 16 of 46 positions shown, 18 images · non-contrast
Comparison: CT 11/11/2004

CLINICAL DATA: Hematuria



[Series 2: stone full standard · axial · 0.98mm/px · z∈[-783,-368]mm · 13 of 91 slices shown, 15 images]
[im 4/91  soft-tissue]
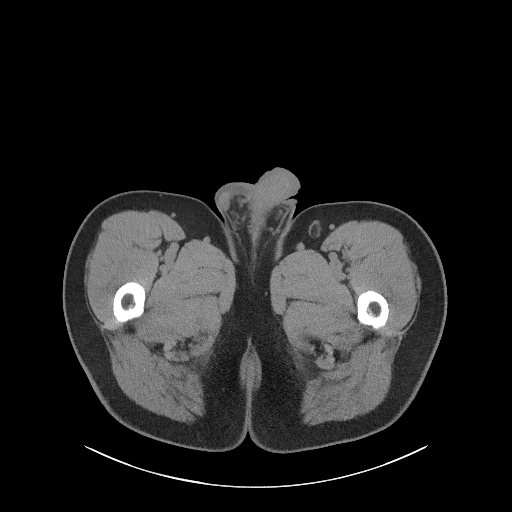
[im 4/91  bone]
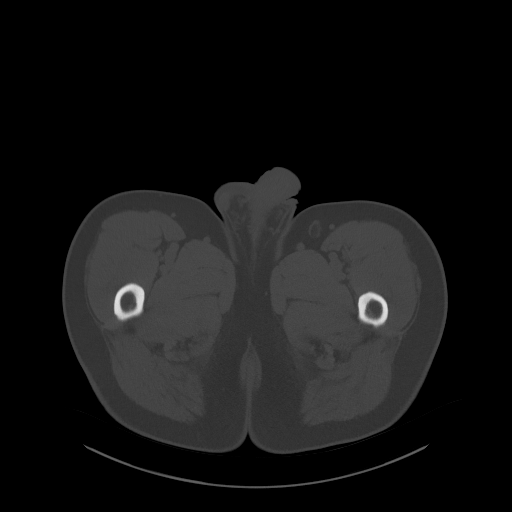
[im 12/91  soft-tissue]
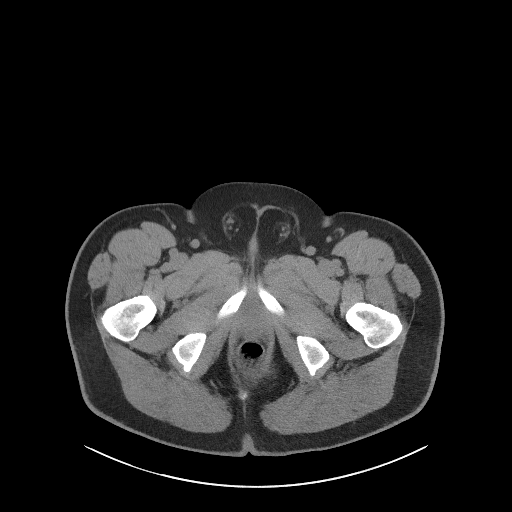
[im 19/91  soft-tissue]
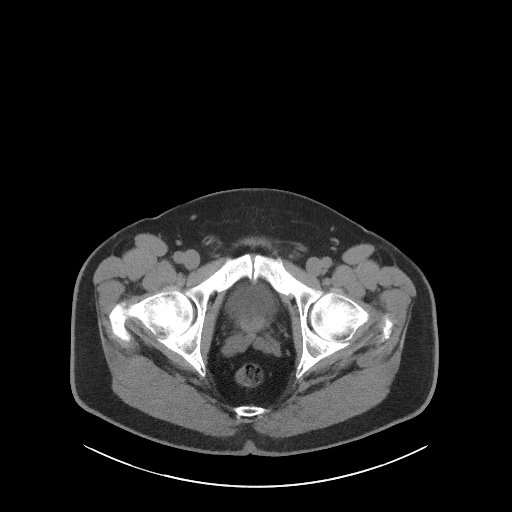
[im 27/91  soft-tissue]
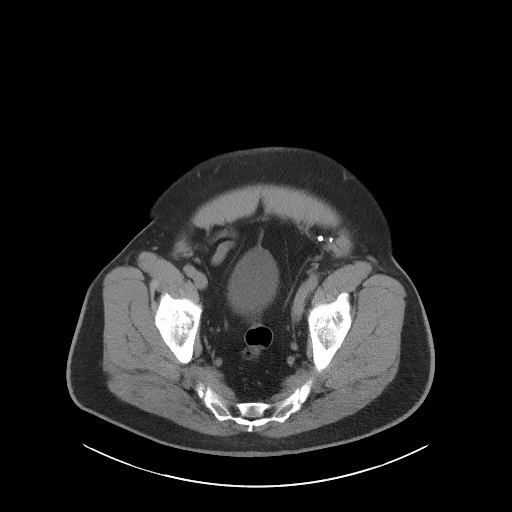
[im 31/91  soft-tissue]
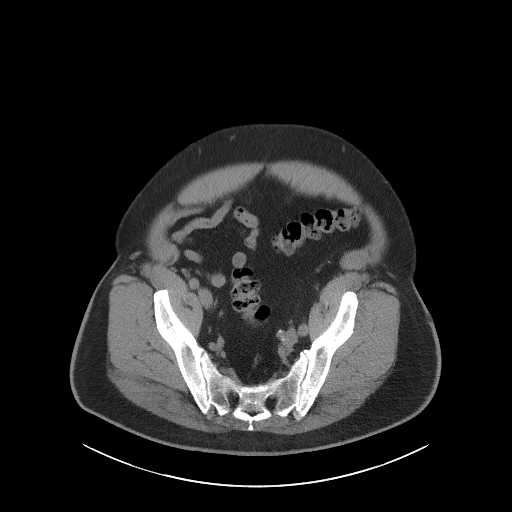
[im 38/91  soft-tissue]
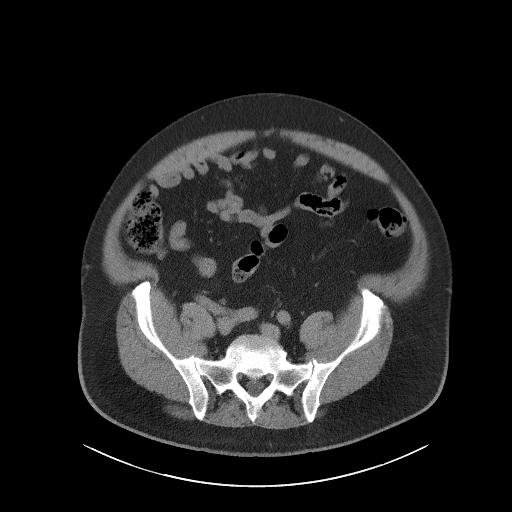
[im 46/91  soft-tissue]
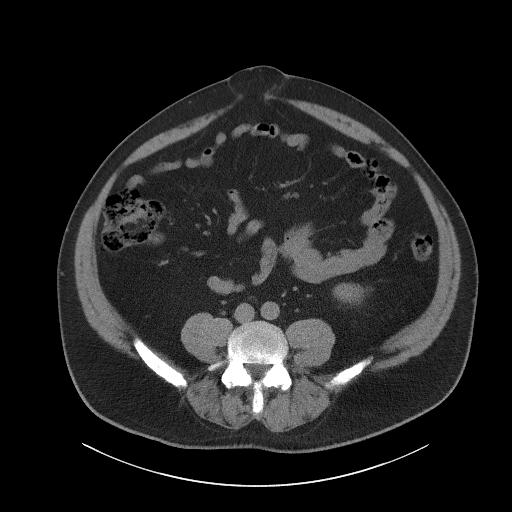
[im 53/91  soft-tissue]
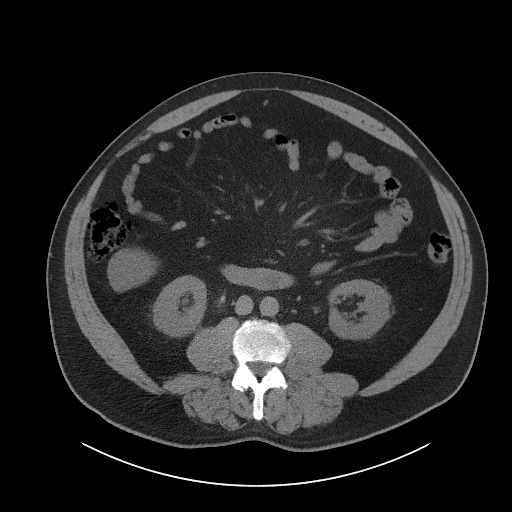
[im 61/91  soft-tissue]
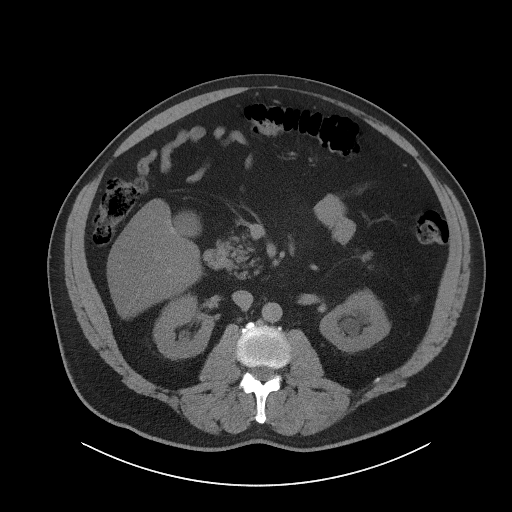
[im 61/91  bone]
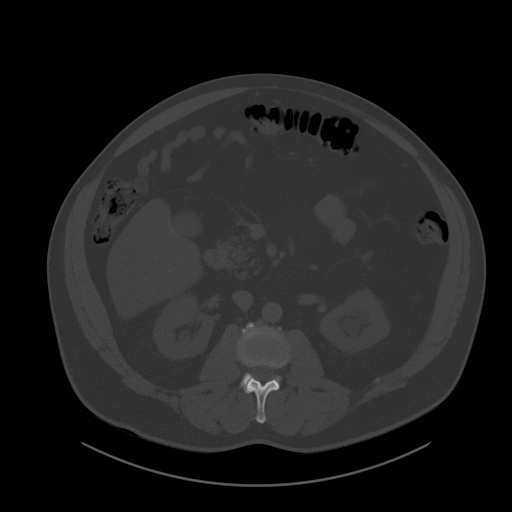
[im 64/91  soft-tissue]
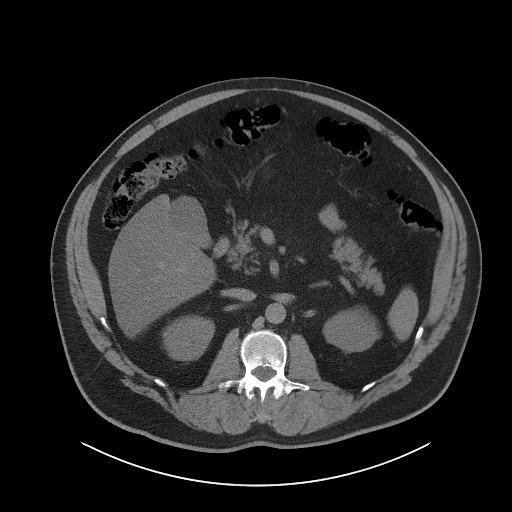
[im 72/91  soft-tissue]
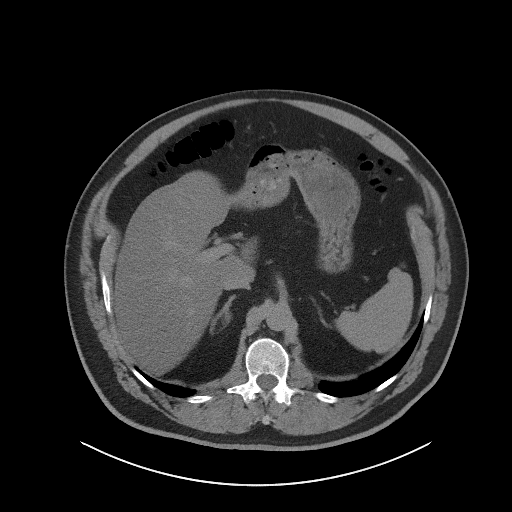
[im 79/91  soft-tissue]
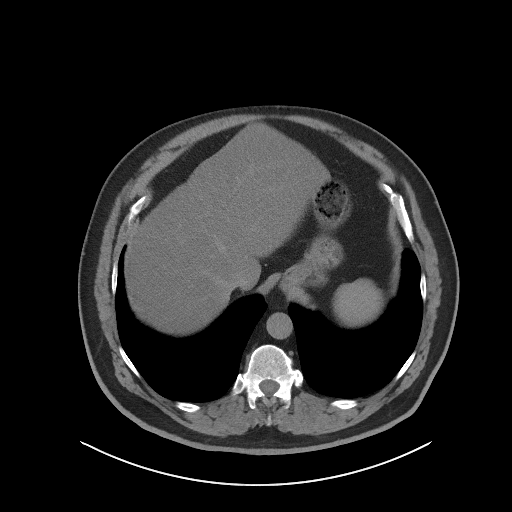
[im 87/91  soft-tissue]
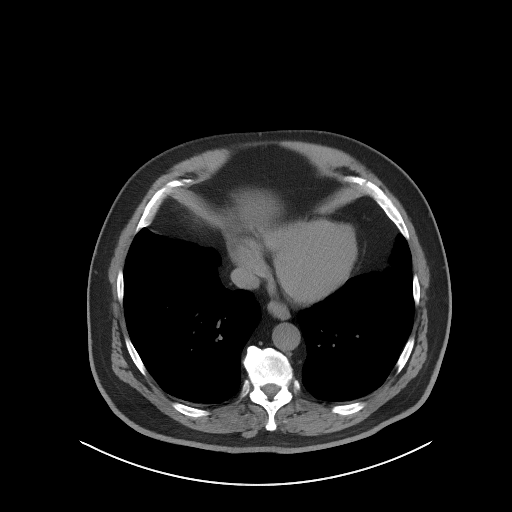

[Series 5: coronal · coronal · 0.85mm/px · 3 of 189 slices shown]
[im 63/189  soft-tissue]
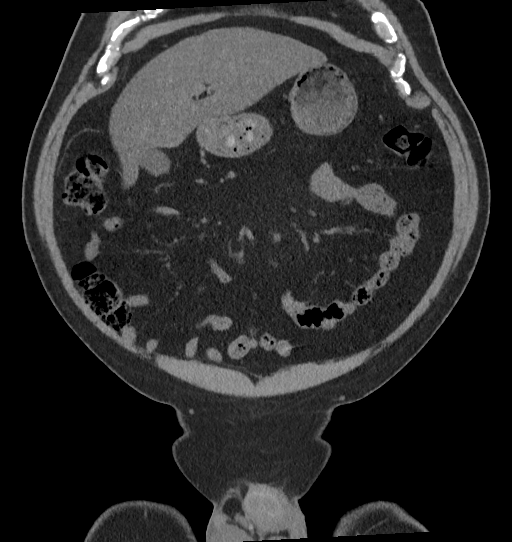
[im 84/189  soft-tissue]
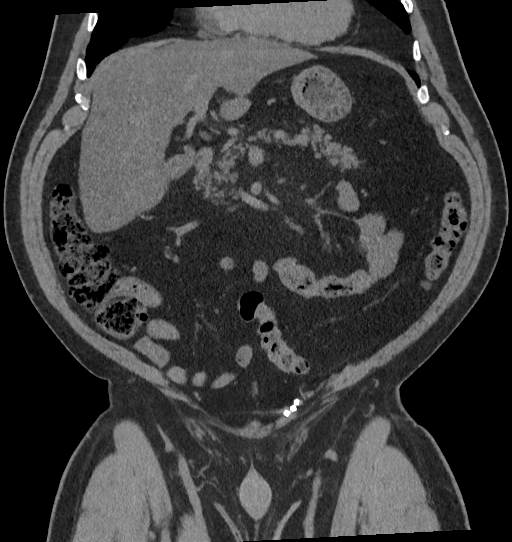
[im 105/189  soft-tissue]
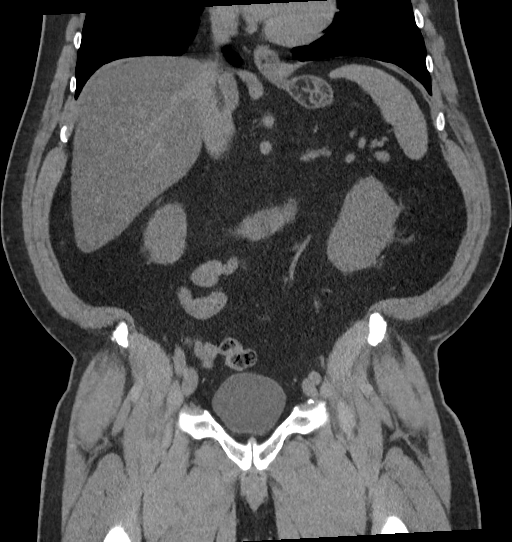

[16 of 46 positions shown; findings below may reference images not displayed]

FINDINGS: Lower chest: Lung bases are clear.

Hepatobiliary: Hepatic steatosis. No calcified gallstone. No biliary
dilatation

Pancreas: Unremarkable. No pancreatic ductal dilatation or
surrounding inflammatory changes.

Spleen: Normal in size without focal abnormality.

Adrenals/Urinary Tract: Adrenal glands are normal. Mild upper pole
hydronephrosis on the left, secondary to a large 13 mm stone in the
upper pole collecting system. There are multiple additional stones
in the left kidney. Minimal right hydronephrosis, secondary to a 3
mm stone at the right UPJ. The bladder is unremarkable

Stomach/Bowel: Stomach is within normal limits. Appendix appears
normal. No evidence of bowel wall thickening, distention, or
inflammatory changes.

Vascular/Lymphatic: Mild atherosclerosis. No aneurysm. No suspicious
lymph nodes

Reproductive: Prostate is unremarkable.

Other: Negative for pelvic effusion or free air. Small fat
containing umbilical hernia

Musculoskeletal: No acute or significant osseous findings.
IMPRESSION: 1. Mild right hydronephrosis, secondary to a 3 mm stone at the right
UPJ
2. Multiple left kidney stones. Mild upper pole hydronephrosis
secondary to a large 13 mm collecting system stone.
3. Hepatic steatosis

## 2023-03-07 ENCOUNTER — Other Ambulatory Visit: Payer: Self-pay | Admitting: Family Medicine

## 2023-04-04 ENCOUNTER — Other Ambulatory Visit (INDEPENDENT_AMBULATORY_CARE_PROVIDER_SITE_OTHER): Payer: 59

## 2023-04-04 DIAGNOSIS — E785 Hyperlipidemia, unspecified: Secondary | ICD-10-CM | POA: Diagnosis not present

## 2023-04-04 DIAGNOSIS — E1169 Type 2 diabetes mellitus with other specified complication: Secondary | ICD-10-CM

## 2023-04-04 DIAGNOSIS — I10 Essential (primary) hypertension: Secondary | ICD-10-CM

## 2023-04-04 DIAGNOSIS — E119 Type 2 diabetes mellitus without complications: Secondary | ICD-10-CM

## 2023-04-05 LAB — BASIC METABOLIC PANEL
BUN: 12 mg/dL (ref 6–23)
CO2: 24 mEq/L (ref 19–32)
Calcium: 9.4 mg/dL (ref 8.4–10.5)
Chloride: 104 mEq/L (ref 96–112)
Creatinine, Ser: 0.93 mg/dL (ref 0.40–1.50)
GFR: 88.83 mL/min (ref >60.00)
Glucose, Bld: 85 mg/dL (ref 70–99)
Potassium: 3.9 mEq/L (ref 3.5–5.1)
Sodium: 140 mEq/L (ref 135–145)

## 2023-04-05 LAB — LIPID PANEL
Cholesterol: 149 mg/dL (ref 0–200)
HDL: 37.9 mg/dL — ABNORMAL LOW (ref >39.00)
LDL Cholesterol: 75 mg/dL (ref 0–99)
NonHDL: 111.24
Total CHOL/HDL Ratio: 4
Triglycerides: 182 mg/dL — ABNORMAL HIGH (ref 0.0–149.0)
VLDL: 36.4 mg/dL (ref 0.0–40.0)

## 2023-04-05 LAB — HEMOGLOBIN A1C: Hgb A1c MFr Bld: 7.2 % — ABNORMAL HIGH (ref 4.6–6.5)

## 2023-04-11 ENCOUNTER — Ambulatory Visit: Payer: 59 | Admitting: Family Medicine

## 2023-04-11 ENCOUNTER — Encounter: Payer: Self-pay | Admitting: Family Medicine

## 2023-04-11 VITALS — BP 122/80 | HR 56 | Temp 98.0°F | Ht 68.5 in | Wt 214.5 lb

## 2023-04-11 DIAGNOSIS — I10 Essential (primary) hypertension: Secondary | ICD-10-CM

## 2023-04-11 DIAGNOSIS — E785 Hyperlipidemia, unspecified: Secondary | ICD-10-CM | POA: Diagnosis not present

## 2023-04-11 DIAGNOSIS — E119 Type 2 diabetes mellitus without complications: Secondary | ICD-10-CM

## 2023-04-11 DIAGNOSIS — E1169 Type 2 diabetes mellitus with other specified complication: Secondary | ICD-10-CM | POA: Diagnosis not present

## 2023-04-11 DIAGNOSIS — Z7984 Long term (current) use of oral hypoglycemic drugs: Secondary | ICD-10-CM | POA: Diagnosis not present

## 2023-04-11 MED ORDER — ROSUVASTATIN CALCIUM 5 MG PO TABS
ORAL_TABLET | ORAL | 3 refills | Status: DC
Start: 2023-04-11 — End: 2024-03-03

## 2023-04-11 MED ORDER — SEMAGLUTIDE(0.25 OR 0.5MG/DOS) 2 MG/1.5ML ~~LOC~~ SOPN: 0.2500 mg | PEN_INJECTOR | SUBCUTANEOUS | 0 refills | Status: DC

## 2023-04-11 NOTE — Progress Notes (Signed)
Subjective:    Patient ID: Terry Dixon, male    DOB: Jul 31, 1960, 62 y.o.   MRN: [id number]  HPI  Wt Readings from Last 3 Encounters:  04/11/23 214 lb 8 oz (97.3 kg)  01/09/23 218 lb 12.8 oz (99.2 kg)  10/09/22 220 lb (99.8 kg)   32.14 kg/m  Vitals:   04/11/23 1349  BP: 122/80  Pulse: (!) 56  Temp: 98 F (36.7 C)  SpO2: 98%   Pt presents for 3 month follow up of chronic health problems incl DM2 and HTN  HTN bp is stable today  No cp or palpitations or headaches or edema  No side effects to medicines  BP Readings from Last 3 Encounters:  04/11/23 122/80  01/09/23 124/74  12/20/22 (!) 140/67    Pulse Readings from Last 3 Encounters:  04/11/23 (!) 56  01/09/23 (!) 59  12/20/22 72   Losartan 100 mg daily   Has been well controlled at home  Just got off work  12 hour days - 7 d per week   Lab Results  Component Value Date   NA 140 04/04/2023   K 3.9 04/04/2023   CO2 24 04/04/2023   GLUCOSE 85 04/04/2023   BUN 12 04/04/2023   CREATININE 0.93 04/04/2023   CALCIUM 9.4 04/04/2023   GFR 88.83 04/04/2023   GFRNONAA >60 12/07/2021      DM2 Lab Results  Component Value Date   HGBA1C 7.2 (H) 04/04/2023   This is stable    Metformin xr 500 mg daily - no side effects after first 3 weeks (had diarrhea)   Weight is down 4 lb   Some diet drinks  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  Slightly better or the same    Lab Results  Component Value Date   MICROALBUR 4.7 (H) 10/09/2022   Eye exam-yearly      Hyperlipidemia Lab Results  Component Value Date   CHOL 149 04/04/2023   CHOL 145 09/28/2022   CHOL 132 02/11/2021   Lab Results  Component Value Date   HDL 37.90 (L) 04/04/2023   HDL 37.60 (L) 09/28/2022   HDL 35.00 (L) 02/11/2021   Lab Results  Component Value Date   LDLCALC 75 04/04/2023   LDLCALC 80 09/28/2022   LDLCALC 77 02/11/2021   Lab Results  Component Value Date   TRIG 182.0 (H) 04/04/2023   TRIG 138.0 09/28/2022    TRIG 97.0 02/11/2021   Lab Results  Component Value Date   CHOLHDL 4 04/04/2023   CHOLHDL 4 09/28/2022   CHOLHDL 4 02/11/2021   Lab Results  Component Value Date   LDLDIRECT 115.6 11/17/2010   LDLDIRECT 121.8 07/17/2007   LDLDIRECT 100.5 01/03/2007        Patient Active Problem List   Diagnosis Date Noted   Pseudopolyposis of colon without complication, unspecified part of colon (HCC) 10/09/2022   BPH (benign prostatic hyperplasia) 02/16/2021   Diabetes mellitus type 2, controlled (HCC) 03/11/2016   Prostate cancer screening 12/09/2014   Steatohepatitis, non-alcoholic 08/06/2013   Colon cancer screening 05/31/2012   Low serum testosterone level 11/28/2011   Routine general medical examination at a health care facility 11/23/2011   Family history of non-anemic vitamin B12 deficiency 05/22/2011   ERECTILE DYSFUNCTION 05/26/2008   Class 1 obesity due to excess calories with body mass index (BMI) of 32.0 to 32.9 in adult 01/03/2007   Hyperlipidemia associated with type 2 diabetes mellitus (HCC) 01/01/2007   Essential  hypertension 01/01/2007   DIVERTICULOSIS, COLON 01/01/2007   Past Medical History:  Diagnosis Date   Allergy    mold,rag weed,   Asthma    Diverticulitis    colon    ED (erectile dysfunction)    Family history of non-anemic vitamin B12 deficiency 05/22/2011   Hyperlipidemia    Hypertension    Low testosterone    NASH (nonalcoholic steatohepatitis)    Obesity    Seasonal allergies    Sleep apnea    not used c pap in 6 month,updated 05/30/22   Past Surgical History:  Procedure Laterality Date   COLONOSCOPY     EXTRACORPOREAL SHOCK WAVE LITHOTRIPSY Left 12/22/2021   Procedure: EXTRACORPOREAL SHOCK WAVE LITHOTRIPSY (ESWL);  Surgeon: Orson Ape, MD;  Location: [facility];  Service: Urology;  Laterality: Left;   EXTRACORPOREAL SHOCK WAVE LITHOTRIPSY Left 05/11/2022   Procedure: EXTRACORPOREAL SHOCK WAVE LITHOTRIPSY (ESWL);  Surgeon: Orson Ape, MD;  Location: [facility];  Service: Urology;  Laterality: Left;   HERNIA REPAIR  07/17/1998   R & L   VASECTOMY  07/17/1997   Social History   Tobacco Use   Smoking status: Former    Types: Cigarettes    Passive exposure: Never   Smokeless tobacco: Never   Tobacco comments:    age 34 for 1 year   Vaping Use   Vaping status: Never Used  Substance Use Topics   Alcohol use: Yes    Comment: very rarely   Drug use: No   Family History  Problem Relation Age of Onset   Diabetes Mother    COPD Mother    Benign prostatic hyperplasia Father    Hypertension Father    Prostate cancer Father    Breast cancer Sister 22       died at 20   Prostate cancer Maternal Grandfather    Colon cancer Neg Hx    Esophageal cancer Neg Hx    Liver disease Neg Hx    Stomach cancer Neg Hx    Pancreatic cancer Neg Hx    Colon polyps Neg Hx    Crohn's disease Neg Hx    Rectal cancer Neg Hx    Ulcerative colitis Neg Hx    Allergies  Allergen Reactions   Milk (Cow) Other (See Comments)    Any Milk products...Marland KitchenRunny nose, cough   Viagra [Sildenafil Citrate]     Leg pain   Current Outpatient Medications on File Prior to Visit  Medication Sig Dispense Refill   albuterol (VENTOLIN HFA) 108 (90 Base) MCG/ACT inhaler Inhale 1-2 puffs into the lungs every 6 (six) hours as needed. 18 g 1   cetirizine (ZYRTEC) 10 MG tablet Take 10 mg by mouth daily as needed for allergies.     CIALIS 5 MG tablet TAKE 1 TABLET (5 MG TOTAL) BY MOUTH DAILY AS NEEDED FOR ERECTILE DYSFUNCTION. 10 tablet 5   EPIPEN 2-PAK 0.3 MG/0.3ML SOAJ injection Inject 0.3 mg into the muscle as needed for anaphylaxis. Reported on 07/06/2015-use as needed 1 each 11   fluticasone (FLONASE) 50 MCG/ACT nasal spray Place 2 sprays into both nostrils daily as needed.   19   losartan (COZAAR) 100 MG tablet TAKE 1 TABLET BY MOUTH EVERY DAY 90 tablet 1   metFORMIN (GLUCOPHAGE-XR) 500 MG 24 hr tablet Take 1 tablet (500 mg total) by mouth daily with  breakfast. 90 tablet 1   Multiple Vitamin (DAILY MULTIVITAMIN PO) Take 1 tablet by mouth daily.  Omega-3 Fatty Acids (FISH OIL) 1200 MG CAPS Take by mouth daily.     ONETOUCH DELICA LANCETS 33G MISC Check blood sugar twice a day and as directed. Dx E11.9 100 each 3   ONETOUCH ULTRA test strip CHECK BLOOD SUGAR TWICE A DAY AND AS DIRECTED. DX E11.9 200 strip 1   SYMBICORT 80-4.5 MCG/ACT inhaler Inhale 1 puff into the lungs daily as needed.     tamsulosin (FLOMAX) 0.4 MG CAPS capsule Take 1 capsule (0.4 mg total) by mouth daily. 30 capsule 11   No current facility-administered medications on file prior to visit.    Review of Systems  Constitutional:  Positive for fatigue. Negative for activity change, appetite change, fever and unexpected weight change.       Some fatigue from 12 hour shifts/ no days off in months   HENT:  Negative for congestion, rhinorrhea, sore throat and trouble swallowing.   Eyes:  Negative for pain, redness, itching and visual disturbance.  Respiratory:  Negative for cough, chest tightness, shortness of breath and wheezing.   Cardiovascular:  Negative for chest pain and palpitations.  Gastrointestinal:  Negative for abdominal pain, blood in stool, constipation, diarrhea and nausea.  Endocrine: Negative for cold intolerance, heat intolerance, polydipsia and polyuria.  Genitourinary:  Negative for difficulty urinating, dysuria, frequency and urgency.  Musculoskeletal:  Negative for arthralgias, joint swelling and myalgias.  Skin:  Negative for pallor and rash.  Neurological:  Negative for dizziness, tremors, weakness, numbness and headaches.  Hematological:  Negative for adenopathy. Does not bruise/bleed easily.  Psychiatric/Behavioral:  Negative for decreased concentration and dysphoric mood. The patient is not nervous/anxious.        Objective:   Physical Exam Constitutional:      General: He is not in acute distress.    Appearance: Normal appearance. He is  well-developed. He is obese. He is not ill-appearing or diaphoretic.  HENT:     Head: Normocephalic and atraumatic.     Mouth/Throat:     Mouth: Mucous membranes are moist.  Eyes:     Conjunctiva/sclera: Conjunctivae normal.     Pupils: Pupils are equal, round, and reactive to light.  Neck:     Thyroid: No thyromegaly.     Vascular: No carotid bruit or JVD.  Cardiovascular:     Rate and Rhythm: Normal rate and regular rhythm.     Heart sounds: Normal heart sounds.     No gallop.  Pulmonary:     Effort: Pulmonary effort is normal. No respiratory distress.     Breath sounds: Normal breath sounds. No wheezing or rales.  Abdominal:     General: There is no distension or abdominal bruit.     Palpations: Abdomen is soft.  Musculoskeletal:     Cervical back: Normal range of motion and neck supple.     Right lower leg: No edema.     Left lower leg: No edema.  Lymphadenopathy:     Cervical: No cervical adenopathy.  Skin:    General: Skin is warm and dry.     Coloration: Skin is not pale.     Findings: No rash.  Neurological:     Mental Status: He is alert.     Coordination: Coordination normal.     Deep Tendon Reflexes: Reflexes are normal and symmetric. Reflexes normal.  Psychiatric:        Mood and Affect: Mood normal.           Assessment & Plan:   Problem  List Items Addressed This Visit       Cardiovascular and Mediastinum   Essential hypertension    bp in fair control at this time  Back today after losing insurance for a while BP Readings from Last 1 Encounters:  04/11/23 122/80   No changes needed Most recent labs reviewed  Disc lifstyle change with low sodium diet and exercise  Plan to continue losartan 100 mg daily       Relevant Medications   rosuvastatin (CRESTOR) 5 MG tablet   Other Relevant Orders   Lipid panel   Basic metabolic panel     Endocrine   Diabetes mellitus type 2, controlled (HCC) - Primary    Lab Results  Component Value Date    HGBA1C 7.2 (H) 04/04/2023   Not quite at goal of undr 7  Taking metformin xr 500 mg once daily -had some diarrhea (unsure if could tolerate more) Diet is improving  No time for extra exercise most days Sent for eye exam  Foot exam is normal  On arb  Starting statin today   Discussed addn of GLP-1 for dm and weight  Disc option of GLP medication including possible side effects like GI intolerance and risk of thyroid and endocrine cancer, pancreatitis and gallstones, kidney problems and diabetic retinopathy Sent in semaglutide to try 0.25 mg weekly  Pt will call once he starts it   Follow up 3 mo lab prior       Relevant Medications   rosuvastatin (CRESTOR) 5 MG tablet   Semaglutide,0.25 or 0.5MG /DOS, 2 MG/1.5ML SOPN   Other Relevant Orders   Hemoglobin A1c   Hyperlipidemia associated with type 2 diabetes mellitus (HCC)    Disc goals for lipids and reasons to control them Rev last labs with pt Rev low sat fat diet in detail LDL 75 In light of dm vasc risk will start crestor 5 mg three days weekly  Lab and follow up in 3 mo       Relevant Medications   rosuvastatin (CRESTOR) 5 MG tablet   Semaglutide,0.25 or 0.5MG /DOS, 2 MG/1.5ML SOPN   Other Relevant Orders   Lipid panel   ALT   AST   Basic metabolic panel

## 2023-04-11 NOTE — Assessment & Plan Note (Signed)
bp in fair control at this time  Back today after losing insurance for a while BP Readings from Last 1 Encounters:  04/11/23 122/80   No changes needed Most recent labs reviewed  Disc lifstyle change with low sodium diet and exercise  Plan to continue losartan 100 mg daily

## 2023-04-11 NOTE — Assessment & Plan Note (Signed)
Disc goals for lipids and reasons to control them Rev last labs with pt Rev low sat fat diet in detail LDL 75 In light of dm vasc risk will start crestor 5 mg three days weekly  Lab and follow up in 3 mo

## 2023-04-11 NOTE — Patient Instructions (Addendum)
Try to get most of your carbohydrates from produce (with the exception of white potatoes) and whole grains Eat less bread/pasta/rice/snack foods/cereals/sweets and other items from the middle of the grocery store (processed carbs)  Get exercise when you can   Continue metformin at the current dose   Read about the GLP-1 medication called ozempic (semaglutide)  I will send it to the pharmacy and see if this or another brand is covered   You let us know when and if you start it   For vascular risk start the generic crestor 5 mg three days weekly   Follow up in 3 months with labs prior   Take care of yourself

## 2023-04-11 NOTE — Assessment & Plan Note (Signed)
Lab Results  Component Value Date   HGBA1C 7.2 (H) 04/04/2023   Not quite at goal of undr 7  Taking metformin xr 500 mg once daily -had some diarrhea (unsure if could tolerate more) Diet is improving  No time for extra exercise most days Sent for eye exam  Foot exam is normal  On arb  Starting statin today   Discussed addn of GLP-1 for dm and weight  Disc option of GLP medication including possible side effects like GI intolerance and risk of thyroid and endocrine cancer, pancreatitis and gallstones, kidney problems and diabetic retinopathy Sent in semaglutide to try 0.25 mg weekly  Pt will call once he starts it   Follow up 3 mo lab prior

## 2023-05-07 ENCOUNTER — Other Ambulatory Visit: Payer: Self-pay | Admitting: Family Medicine

## 2023-05-07 NOTE — Telephone Encounter (Signed)
Last filled on 04/11/23 #1.5 mL / 0 refill  F/u scheduled on 07/27/23

## 2023-05-07 NOTE — Telephone Encounter (Signed)
Does he want to go up to tht 0.5 dose? Tolerating ok?

## 2023-05-08 NOTE — Telephone Encounter (Signed)
Pt said that he read the packet info on Ozempic and it said if you have any family history of cancer don't use this med. Pt said his sister passed away from cancer in her 93's so he never started med. This is a auto refill request and pt didn't request it. Rx declined and removed from med list.  Message sent to PCP as an [place]

## 2023-05-09 NOTE — Telephone Encounter (Signed)
Please let me know if this changes his mind, thanks

## 2023-05-10 NOTE — Telephone Encounter (Signed)
Spoke with pt relaying info from [place] Scientist, research (physical sciences)) and asked that additional info changes his mind about trying Ozempic. Pt says he still declines to start Ozempic at this time due to his sister dying from CA.   Also, pt wanted to make Dr Milinda Antis aware that he has noticed muscle aches- especially in legs when taking rosuvastatin. But he wants to continue med to see what his next labs say. Will discuss further with Dr Milinda Antis at that time.

## 2023-07-04 ENCOUNTER — Other Ambulatory Visit: Payer: Self-pay | Admitting: Family Medicine

## 2023-07-06 ENCOUNTER — Other Ambulatory Visit: Payer: 59

## 2023-07-13 ENCOUNTER — Ambulatory Visit: Payer: 59 | Admitting: Family Medicine

## 2023-07-20 ENCOUNTER — Other Ambulatory Visit (INDEPENDENT_AMBULATORY_CARE_PROVIDER_SITE_OTHER): Payer: 59

## 2023-07-20 ENCOUNTER — Ambulatory Visit: Payer: 59 | Admitting: Family Medicine

## 2023-07-20 DIAGNOSIS — E119 Type 2 diabetes mellitus without complications: Secondary | ICD-10-CM

## 2023-07-20 DIAGNOSIS — I10 Essential (primary) hypertension: Secondary | ICD-10-CM | POA: Diagnosis not present

## 2023-07-20 DIAGNOSIS — E1169 Type 2 diabetes mellitus with other specified complication: Secondary | ICD-10-CM

## 2023-07-21 LAB — BASIC METABOLIC PANEL
BUN: 12 mg/dL (ref 7–25)
CO2: 25 mmol/L (ref 20–32)
Calcium: 9.5 mg/dL (ref 8.6–10.3)
Chloride: 103 mmol/L (ref 98–110)
Creat: 0.95 mg/dL (ref 0.70–1.35)
Glucose, Bld: 92 mg/dL (ref 65–99)
Potassium: 3.9 mmol/L (ref 3.5–5.3)
Sodium: 138 mmol/L (ref 135–146)

## 2023-07-21 LAB — LIPID PANEL
Cholesterol: 129 mg/dL (ref <200)
HDL: 40 mg/dL (ref >=40)
LDL Cholesterol (Calc): 70 mg/dL
Non-HDL Cholesterol (Calc): 89 mg/dL (ref <130)
Total CHOL/HDL Ratio: 3.2 (calc) (ref <5.0)
Triglycerides: 100 mg/dL (ref <150)

## 2023-07-21 LAB — HEMOGLOBIN A1C
Hgb A1c MFr Bld: 8.8 %{Hb} — ABNORMAL HIGH (ref <5.7)
Mean Plasma Glucose: 206 mg/dL
eAG (mmol/L): 11.4 mmol/L

## 2023-07-21 LAB — ALT: ALT: 46 U/L (ref 9–46)

## 2023-07-21 LAB — AST: AST: 31 U/L (ref 10–35)

## 2023-07-23 ENCOUNTER — Other Ambulatory Visit: Payer: Self-pay | Admitting: Family Medicine

## 2023-07-23 MED ORDER — TAMSULOSIN HCL 0.4 MG PO CAPS: 0.4000 mg | ORAL_CAPSULE | Freq: Every day | ORAL | 11 refills | Status: DC

## 2023-07-23 MED ORDER — TAMSULOSIN HCL 0.4 MG PO CAPS: 0.4000 mg | ORAL_CAPSULE | Freq: Every day | ORAL | 3 refills | Status: DC

## 2023-07-23 NOTE — Telephone Encounter (Signed)
 Refill request printed instead of sending electronically. Sending new ERX.

## 2023-07-23 NOTE — Addendum Note (Signed)
 Addended by: Wendie Simmer B on: 07/23/2023 09:37 AM   Modules accepted: Orders

## 2023-07-23 NOTE — Telephone Encounter (Signed)
 Copied from CRM [id number]. Topic: Clinical - Medication Refill >> Jul 23, 2023  9:03 AM Viola FALCON wrote: Most Recent Primary Care Visit:  Provider: RANDEEN HARDY A  Department: [facility]  Visit Type: OFFICE VISIT  Date: 04/11/2023  Medication: Tamsulosin    Has the patient contacted their pharmacy? Yes (Agent: If no, request that the patient contact the pharmacy for the refill. If patient does not wish to contact the pharmacy document the reason why and proceed with request.) (Agent: If yes, when and what did the pharmacy advise?)  Is this the correct pharmacy for this prescription? Yes If no, delete pharmacy and type the correct one.  This is the patient's preferred pharmacy:   [phone] [facility] [place], [place] - [address] [place] [address] Phone: [phone] Fax: [phone]    Has the prescription been filled recently? Yes  Is the patient out of the medication? Yes, since yesterday  Has the patient been seen for an appointment in the last year OR does the patient have an upcoming appointment? Yes  Can we respond through MyChart? Yes  Agent: Please be advised that Rx refills may take up to 3 business days. We ask that you follow-up with your pharmacy.

## 2023-07-26 ENCOUNTER — Telehealth: Payer: Self-pay | Admitting: Family Medicine

## 2023-07-26 DIAGNOSIS — N401 Enlarged prostate with lower urinary tract symptoms: Secondary | ICD-10-CM

## 2023-07-26 DIAGNOSIS — N529 Male erectile dysfunction, unspecified: Secondary | ICD-10-CM

## 2023-07-26 DIAGNOSIS — R7989 Other specified abnormal findings of blood chemistry: Secondary | ICD-10-CM

## 2023-07-26 NOTE — Telephone Encounter (Signed)
 Spoke to pt, pt stated his current urologist, Dr. Suszanne Conners. [facility] office has closed down. Pt requested another referral from Dr. Milinda Antis? Preferred location is [place]. Call back # [phone].

## 2023-07-27 ENCOUNTER — Ambulatory Visit: Payer: 59 | Admitting: Family Medicine

## 2023-07-27 NOTE — Telephone Encounter (Signed)
 Sent mychart letting pt know  ?

## 2023-07-27 NOTE — Addendum Note (Signed)
 Addended by: Roxy Manns A on: 07/27/2023 12:21 PM   Modules accepted: Orders

## 2023-07-27 NOTE — Telephone Encounter (Signed)
 I put the referral in  Please let us know if you don't hear in 1-2 weeks    Please route to referral team if needed Thanks

## 2023-07-31 ENCOUNTER — Ambulatory Visit: Payer: 59 | Admitting: Family Medicine

## 2023-08-06 ENCOUNTER — Ambulatory Visit: Payer: 59 | Admitting: Family Medicine

## 2023-08-06 VITALS — BP 122/76 | HR 52 | Temp 98.0°F | Ht 68.5 in | Wt 222.5 lb

## 2023-08-06 DIAGNOSIS — Z114 Encounter for screening for human immunodeficiency virus [HIV]: Secondary | ICD-10-CM | POA: Insufficient documentation

## 2023-08-06 DIAGNOSIS — E785 Hyperlipidemia, unspecified: Secondary | ICD-10-CM

## 2023-08-06 DIAGNOSIS — I10 Essential (primary) hypertension: Secondary | ICD-10-CM | POA: Diagnosis not present

## 2023-08-06 DIAGNOSIS — Z6833 Body mass index (BMI) 33.0-33.9, adult: Secondary | ICD-10-CM

## 2023-08-06 DIAGNOSIS — Z7984 Long term (current) use of oral hypoglycemic drugs: Secondary | ICD-10-CM

## 2023-08-06 DIAGNOSIS — E66811 Obesity, class 1: Secondary | ICD-10-CM

## 2023-08-06 DIAGNOSIS — E6609 Other obesity due to excess calories: Secondary | ICD-10-CM

## 2023-08-06 DIAGNOSIS — E1169 Type 2 diabetes mellitus with other specified complication: Secondary | ICD-10-CM | POA: Diagnosis not present

## 2023-08-06 DIAGNOSIS — E119 Type 2 diabetes mellitus without complications: Secondary | ICD-10-CM | POA: Diagnosis not present

## 2023-08-06 DIAGNOSIS — G4733 Obstructive sleep apnea (adult) (pediatric): Secondary | ICD-10-CM

## 2023-08-06 DIAGNOSIS — Z1159 Encounter for screening for other viral diseases: Secondary | ICD-10-CM

## 2023-08-06 DIAGNOSIS — J45909 Unspecified asthma, uncomplicated: Secondary | ICD-10-CM | POA: Insufficient documentation

## 2023-08-06 DIAGNOSIS — G4730 Sleep apnea, unspecified: Secondary | ICD-10-CM | POA: Insufficient documentation

## 2023-08-06 NOTE — Progress Notes (Signed)
Subjective:    Patient ID: Terry Dixon, male    DOB: May 11, 1961, 63 y.o.   MRN: [id number]  HPI  Wt Readings from Last 3 Encounters:  08/06/23 222 lb 8 oz (100.9 kg)  04/11/23 214 lb 8 oz (97.3 kg)  01/09/23 218 lb 12.8 oz (99.2 kg)   33.34 kg/m  Vitals:   08/06/23 1144  BP: 122/76  Pulse: (!) 52  Temp: 98 F (36.7 C)  SpO2: 96%   Pt presents for follow up of DM2, HTN, OSA and other chronic medical problems   HTN bp is stable today  No cp or palpitations or headaches or edema  No side effects to medicines  BP Readings from Last 3 Encounters:  08/06/23 122/76  04/11/23 122/80  01/09/23 124/74    Losartan 100 mg daily    Dm2 Lab Results  Component Value Date   HGBA1C 8.8 (H) 07/20/2023   HGBA1C 7.2 (H) 04/04/2023   HGBA1C 7.2 (A) 01/09/2023   HGBA1C 7.2 01/09/2023   HGBA1C 7.2 (A) 01/09/2023   HGBA1C 7.2 (A) 01/09/2023   This is up/worse -due to holiday eating   Metformin xr 500 mg daily - tolerates  Glucose is improved by watching diet  Is mindful now   Eating less  Pizza Biscuits   At work - if walking for 12 hours  Goes to gym once per week     Discussed GLP-1 med last time -pt declined due to family history of cancer   Lab Results  Component Value Date   MICROALBUR 4.7 (H) 10/09/2022     Lab Results  Component Value Date   NA 138 07/20/2023   K 3.9 07/20/2023   CO2 25 07/20/2023   GLUCOSE 92 07/20/2023   BUN 12 07/20/2023   CREATININE 0.95 07/20/2023   CALCIUM 9.5 07/20/2023   GFR 88.83 04/04/2023   GFRNONAA >60 12/07/2021     Hyperlipidemia Lab Results  Component Value Date   CHOL 129 07/20/2023   CHOL 149 04/04/2023   CHOL 145 09/28/2022   Lab Results  Component Value Date   HDL 40 07/20/2023   HDL 37.90 (L) 04/04/2023   HDL 37.60 (L) 09/28/2022   Lab Results  Component Value Date   LDLCALC 70 07/20/2023   LDLCALC 75 04/04/2023   LDLCALC 80 09/28/2022   Lab Results  Component Value Date   TRIG 100  07/20/2023   TRIG 182.0 (H) 04/04/2023   TRIG 138.0 09/28/2022   Lab Results  Component Value Date   CHOLHDL 3.2 07/20/2023   CHOLHDL 4 04/04/2023   CHOLHDL 4 09/28/2022   Lab Results  Component Value Date   LDLDIRECT 115.6 11/17/2010   LDLDIRECT 121.8 07/17/2007   LDLDIRECT 100.5 01/03/2007   Last visit we started crestor 5 mg three days weekly Does cause some muscle soreness   Had sleeps study done from [facility]  (ordered by dentist)  Noted moderate to severe sleep apnea  Low sleep duration  Fragmented sleep Not much REM  Short term memory is worse lately   Works midnight till noon 5-6 days per week This is hard on him  Hopes to retire after 66 (probably 86)   His dentist is going to make him an oral appliance to use  Has appointment jan 28   Had cpap about 2 years ago - it was not comfortable and he gave up   Urologist- in feb /will get records transferred  Patient Active Problem List   Diagnosis Date Noted   Asthma 08/06/2023   Sleep apnea 08/06/2023   Encounter for hepatitis C screening test for low risk patient 08/06/2023   Encounter for screening for HIV 08/06/2023   Pseudopolyposis of colon without complication, unspecified part of colon (HCC) 10/09/2022   BPH (benign prostatic hyperplasia) 02/16/2021   Diabetes mellitus type 2, controlled (HCC) 03/11/2016   Prostate cancer screening 12/09/2014   Steatohepatitis, non-alcoholic 08/06/2013   Colon cancer screening 05/31/2012   Low serum testosterone level 11/28/2011   Routine general medical examination at a health care facility 11/23/2011   Family history of non-anemic vitamin B12 deficiency 05/22/2011   ED (erectile dysfunction) 05/26/2008   Class 1 obesity due to excess calories with serious comorbidity and body mass index (BMI) of 33.0 to 33.9 in adult 01/03/2007   Hyperlipidemia associated with type 2 diabetes mellitus (HCC) 01/01/2007   Essential hypertension 01/01/2007    Diverticulosis of colon 01/01/2007   Past Medical History:  Diagnosis Date   Allergy    mold,rag weed,   Asthma    Diverticulitis    colon    ED (erectile dysfunction)    Family history of non-anemic vitamin B12 deficiency 05/22/2011   Hyperlipidemia    Hypertension    Low testosterone    NASH (nonalcoholic steatohepatitis)    Obesity    Seasonal allergies    Sleep apnea    not used c pap in 6 month,updated 05/30/22   Past Surgical History:  Procedure Laterality Date   COLONOSCOPY     EXTRACORPOREAL SHOCK WAVE LITHOTRIPSY Left 12/22/2021   Procedure: EXTRACORPOREAL SHOCK WAVE LITHOTRIPSY (ESWL);  Surgeon: Orson Ape, MD;  Location: [facility];  Service: Urology;  Laterality: Left;   EXTRACORPOREAL SHOCK WAVE LITHOTRIPSY Left 05/11/2022   Procedure: EXTRACORPOREAL SHOCK WAVE LITHOTRIPSY (ESWL);  Surgeon: Orson Ape, MD;  Location: [facility];  Service: Urology;  Laterality: Left;   HERNIA REPAIR  07/17/1998   R & L   VASECTOMY  07/17/1997   Social History   Tobacco Use   Smoking status: Former    Types: Cigarettes    Passive exposure: Never   Smokeless tobacco: Never   Tobacco comments:    age 40 for 1 year   Vaping Use   Vaping status: Never Used  Substance Use Topics   Alcohol use: Yes    Comment: very rarely   Drug use: No   Family History  Problem Relation Age of Onset   Diabetes Mother    COPD Mother    Benign prostatic hyperplasia Father    Hypertension Father    Prostate cancer Father    Breast cancer Sister 45       died at 20   Prostate cancer Maternal Grandfather    Colon cancer Neg Hx    Esophageal cancer Neg Hx    Liver disease Neg Hx    Stomach cancer Neg Hx    Pancreatic cancer Neg Hx    Colon polyps Neg Hx    Crohn's disease Neg Hx    Rectal cancer Neg Hx    Ulcerative colitis Neg Hx    Allergies  Allergen Reactions   Milk (Cow) Other (See Comments)    Any Milk products...Marland KitchenRunny nose, cough   Viagra [Sildenafil Citrate]      Leg pain   Current Outpatient Medications on File Prior to Visit  Medication Sig Dispense Refill   albuterol (VENTOLIN HFA) 108 (90 Base) MCG/ACT inhaler  Inhale 1-2 puffs into the lungs every 6 (six) hours as needed. 18 g 1   cetirizine (ZYRTEC) 10 MG tablet Take 10 mg by mouth daily as needed for allergies.     CIALIS 5 MG tablet TAKE 1 TABLET (5 MG TOTAL) BY MOUTH DAILY AS NEEDED FOR ERECTILE DYSFUNCTION. 10 tablet 5   EPIPEN 2-PAK 0.3 MG/0.3ML SOAJ injection Inject 0.3 mg into the muscle as needed for anaphylaxis. Reported on 07/06/2015-use as needed 1 each 11   fluticasone (FLONASE) 50 MCG/ACT nasal spray Place 2 sprays into both nostrils daily as needed.   19   losartan (COZAAR) 100 MG tablet TAKE 1 TABLET BY MOUTH EVERY DAY 90 tablet 1   metFORMIN (GLUCOPHAGE-XR) 500 MG 24 hr tablet TAKE 1 TABLET BY MOUTH EVERY DAY WITH BREAKFAST 90 tablet 0   Multiple Vitamin (DAILY MULTIVITAMIN PO) Take 1 tablet by mouth daily.     Omega-3 Fatty Acids (FISH OIL) 1200 MG CAPS Take by mouth daily.     ONETOUCH DELICA LANCETS 33G MISC Check blood sugar twice a day and as directed. Dx E11.9 100 each 3   ONETOUCH ULTRA test strip CHECK BLOOD SUGAR TWICE A DAY AND AS DIRECTED. DX E11.9 200 strip 1   rosuvastatin (CRESTOR) 5 MG tablet Take one pill by mouth every other day (three days weekly) 36 tablet 3   SYMBICORT 80-4.5 MCG/ACT inhaler Inhale 1 puff into the lungs daily as needed.     tamsulosin (FLOMAX) 0.4 MG CAPS capsule Take 1 capsule (0.4 mg total) by mouth daily. 30 capsule 3   No current facility-administered medications on file prior to visit.    Review of Systems  Constitutional:  Positive for fatigue and unexpected weight change. Negative for activity change, appetite change and fever.  HENT:  Negative for congestion, rhinorrhea, sore throat and trouble swallowing.   Eyes:  Negative for pain, redness, itching and visual disturbance.  Respiratory:  Negative for cough, chest tightness, shortness  of breath and wheezing.   Cardiovascular:  Negative for chest pain and palpitations.  Gastrointestinal:  Negative for abdominal pain, blood in stool, constipation, diarrhea and nausea.  Endocrine: Negative for cold intolerance, heat intolerance, polydipsia and polyuria.  Genitourinary:  Negative for difficulty urinating, dysuria, frequency and urgency.  Musculoskeletal:  Negative for arthralgias, joint swelling and myalgias.  Skin:  Negative for pallor and rash.  Neurological:  Negative for dizziness, tremors, weakness, numbness and headaches.  Hematological:  Negative for adenopathy. Does not bruise/bleed easily.  Psychiatric/Behavioral:  Negative for decreased concentration and dysphoric mood. The patient is not nervous/anxious.        Objective:   Physical Exam Constitutional:      General: He is not in acute distress.    Appearance: Normal appearance. He is well-developed. He is obese. He is not ill-appearing or diaphoretic.  HENT:     Head: Normocephalic and atraumatic.  Eyes:     Conjunctiva/sclera: Conjunctivae normal.     Pupils: Pupils are equal, round, and reactive to light.  Neck:     Thyroid: No thyromegaly.     Vascular: No carotid bruit or JVD.  Cardiovascular:     Rate and Rhythm: Normal rate and regular rhythm.     Heart sounds: Normal heart sounds.     No gallop.  Pulmonary:     Effort: Pulmonary effort is normal. No respiratory distress.     Breath sounds: Normal breath sounds. No wheezing or rales.  Abdominal:  General: There is no distension or abdominal bruit.     Palpations: Abdomen is soft.  Musculoskeletal:     Cervical back: Normal range of motion and neck supple.     Right lower leg: No edema.     Left lower leg: No edema.  Lymphadenopathy:     Cervical: No cervical adenopathy.  Skin:    General: Skin is warm and dry.     Coloration: Skin is not pale.     Findings: No rash.  Neurological:     Mental Status: He is alert.     Coordination:  Coordination normal.     Deep Tendon Reflexes: Reflexes are normal and symmetric. Reflexes normal.  Psychiatric:        Mood and Affect: Mood normal.           Assessment & Plan:   Problem List Items Addressed This Visit       Cardiovascular and Mediastinum   Essential hypertension   bp in fair control at this time  Back today after losing insurance for a while BP Readings from Last 1 Encounters:  08/06/23 122/76   No changes needed Most recent labs reviewed  Disc lifstyle change with low sodium diet and exercise  Plan to continue losartan 100 mg daily         Respiratory   Sleep apnea   Reviewed recent home sleep study He was unable to tolerate cpap 2 y ago  Wants to try dental appliance (is getting from dentist) Encouraged strongly to loose weight  Discussed risk of OSA- he is aware  May add to concentration problems as well        Endocrine   Hyperlipidemia associated with type 2 diabetes mellitus (HCC)   Disc goals for lipids and reasons to control them Rev last labs with pt Rev low sat fat diet in detail  LDL of 70 and HDL slightly improvement Tolerates crestor 5 mg three times weekly with some muscle aches/ wants to continue   Will continue to monitor       Diabetes mellitus type 2, controlled (HCC) - Primary   Worse with holiday eating recently  Lab Results  Component Value Date   HGBA1C 8.8 (H) 07/20/2023   HGBA1C 7.2 (H) 04/04/2023   HGBA1C 7.2 (A) 01/09/2023   HGBA1C 7.2 01/09/2023   HGBA1C 7.2 (A) 01/09/2023   HGBA1C 7.2 (A) 01/09/2023  Metformin xr 500 mg daily  Wants to keep this the same and not add GLP-1 at this time due to fear of side effects with cancer in family  Pt wants to watch diet much more carefully /add exercise and follow up in 3 months  Discussed low glycemic eating and strength training  Sent for eye exam report  Lipids are improved         Other   Encounter for screening for HIV   Will add hiv screen to next labs        Relevant Orders   HIV Antibody (routine testing w rflx)   Encounter for hepatitis C screening test for low risk patient   Will add hep c screen to next labs      Relevant Orders   Hepatitis C Antibody   Class 1 obesity due to excess calories with serious comorbidity and body mass index (BMI) of 33.0 to 33.9 in adult   Discussed how this problem influences overall health and the risks it imposes  Reviewed plan for weight loss with lower calorie  diet (via better food choices (lower glycemic and portion control) along with exercise building up to or more than 30 minutes 5 days per week including some aerobic activity and strength training   Gained with holidays  Plans to work on this  Declines GLP-1 at this time for dm and obesity

## 2023-08-06 NOTE — Assessment & Plan Note (Signed)
Reviewed recent home sleep study He was unable to tolerate cpap 2 y ago  Wants to try dental appliance (is getting from dentist) Encouraged strongly to loose weight  Discussed risk of OSA- he is aware  May add to concentration problems as well

## 2023-08-06 NOTE — Assessment & Plan Note (Signed)
Disc goals for lipids and reasons to control them Rev last labs with pt Rev low sat fat diet in detail  LDL of 70 and HDL slightly improvement Tolerates crestor 5 mg three times weekly with some muscle aches/ wants to continue   Will continue to monitor

## 2023-08-06 NOTE — Assessment & Plan Note (Signed)
Discussed how this problem influences overall health and the risks it imposes  Reviewed plan for weight loss with lower calorie diet (via better food choices (lower glycemic and portion control) along with exercise building up to or more than 30 minutes 5 days per week including some aerobic activity and strength training   Gained with holidays  Plans to work on this  Declines GLP-1 at this time for dm and obesity

## 2023-08-06 NOTE — Assessment & Plan Note (Signed)
Will add hep c screen to next labs

## 2023-08-06 NOTE — Patient Instructions (Addendum)
Try to get most of your carbohydrates from produce (with the exception of white potatoes) and whole grains Eat less bread/pasta/rice/snack foods/cereals/sweets and other items from the middle of the grocery store (processed carbs)   You are getting cardio from work  Add some strength training to your routine, this is important for bone and brain health and can reduce your risk of falls and help your body use insulin properly and regulate weight  Light weights, exercise bands , and internet videos are a good way to start  Yoga (chair or regular), machines , floor exercises or a gym with machines are also good options    If the generic crestor causes bad muscle pain - hold it and cough   Cholesterol is in a better range

## 2023-08-06 NOTE — Assessment & Plan Note (Signed)
bp in fair control at this time  Back today after losing insurance for a while BP Readings from Last 1 Encounters:  08/06/23 122/76   No changes needed Most recent labs reviewed  Disc lifstyle change with low sodium diet and exercise  Plan to continue losartan 100 mg daily

## 2023-08-06 NOTE — Assessment & Plan Note (Signed)
Will add hiv screen to next labs

## 2023-08-06 NOTE — Assessment & Plan Note (Signed)
Worse with holiday eating recently  Lab Results  Component Value Date   HGBA1C 8.8 (H) 07/20/2023   HGBA1C 7.2 (H) 04/04/2023   HGBA1C 7.2 (A) 01/09/2023   HGBA1C 7.2 01/09/2023   HGBA1C 7.2 (A) 01/09/2023   HGBA1C 7.2 (A) 01/09/2023  Metformin xr 500 mg daily  Wants to keep this the same and not add GLP-1 at this time due to fear of side effects with cancer in family  Pt wants to watch diet much more carefully /add exercise and follow up in 3 months  Discussed low glycemic eating and strength training  Sent for eye exam report  Lipids are improved

## 2023-08-30 ENCOUNTER — Other Ambulatory Visit: Payer: Self-pay | Admitting: Family Medicine

## 2023-09-05 ENCOUNTER — Ambulatory Visit: Payer: Self-pay | Admitting: Urology

## 2023-09-29 ENCOUNTER — Other Ambulatory Visit: Payer: Self-pay | Admitting: Family Medicine

## 2023-10-10 ENCOUNTER — Encounter: Payer: Self-pay | Admitting: Urology

## 2023-10-10 ENCOUNTER — Ambulatory Visit: Payer: 59 | Admitting: Urology

## 2023-10-10 VITALS — BP 137/80 | HR 62 | Ht 68.5 in | Wt 222.0 lb

## 2023-10-10 DIAGNOSIS — Z87442 Personal history of urinary calculi: Secondary | ICD-10-CM

## 2023-10-10 DIAGNOSIS — N401 Enlarged prostate with lower urinary tract symptoms: Secondary | ICD-10-CM | POA: Diagnosis not present

## 2023-10-10 DIAGNOSIS — N5201 Erectile dysfunction due to arterial insufficiency: Secondary | ICD-10-CM

## 2023-10-10 DIAGNOSIS — Z125 Encounter for screening for malignant neoplasm of prostate: Secondary | ICD-10-CM

## 2023-10-10 DIAGNOSIS — N529 Male erectile dysfunction, unspecified: Secondary | ICD-10-CM

## 2023-10-10 LAB — URINALYSIS, COMPLETE
Bilirubin, UA: NEGATIVE
Glucose, UA: NEGATIVE
Leukocytes,UA: NEGATIVE
Nitrite, UA: NEGATIVE
Protein,UA: NEGATIVE
RBC, UA: NEGATIVE
Specific Gravity, UA: 1.02 (ref 1.005–1.030)
Urobilinogen, Ur: 0.2 mg/dL (ref 0.2–1.0)
pH, UA: 5.5 (ref 5.0–7.5)

## 2023-10-10 LAB — MICROSCOPIC EXAMINATION: Bacteria, UA: NONE SEEN

## 2023-10-10 LAB — BLADDER SCAN AMB NON-IMAGING: Scan Result: 12

## 2023-10-10 NOTE — Progress Notes (Signed)
 I, Terry Dixon, acting as a scribe for Terry Altes, MD., have documented all relevant documentation on the behalf of Terry Altes, MD, as directed by Terry Altes, MD while in the presence of Terry Altes, MD.  10/10/2023 3:17 PM   Terry Dixon [birth year] [id number]  Referring provider: Judy Pimple, MD [address] [place],  [place] [address]  Chief Complaint  Patient presents with   Establish Care   Benign Prostatic Hypertrophy    HPI: Terry Dixon is a 63 y.o. male presents to establish urologic care.   Previously followed by Dr Evelene Croon for a history of stone disease, BPH, and erectile dysfunction. He initially established care with Dr Evelene Croon in 1996 after a vasectomy. He obtained his records on disk, however unable to view the records as it states the file is damaged. He did have a flash drive that he also provided today Currently has no bothersome lower urinary tract symptoms on tamsulosin He takes tadalafil 5mg  as needed for ED He had a stone treated with SWL 1-2 years ago, however had to have the stone retreated. On record review, looks like I saw him 12/17/2020 for a second opinion regarding the prostate cancer check. His last PSA was 1 year ago and was stable at 1.10  PSA trend  PSA  Latest Ref Rng 0.10 - 4.00 ng/mL  01/31/2019 0.78   02/09/2020 0.70   02/11/2021 1.10   09/28/2022 1.10       PMH: Past Medical History:  Diagnosis Date   Allergy    mold,rag weed,   Asthma    Diverticulitis    colon    ED (erectile dysfunction)    Family history of non-anemic vitamin B12 deficiency 05/22/2011   Hyperlipidemia    Hypertension    Low testosterone    NASH (nonalcoholic steatohepatitis)    Obesity    Seasonal allergies    Sleep apnea    not used c pap in 6 month,updated 05/30/22    Surgical History: Past Surgical History:  Procedure Laterality Date   COLONOSCOPY     EXTRACORPOREAL SHOCK WAVE LITHOTRIPSY Left 12/22/2021    Procedure: EXTRACORPOREAL SHOCK WAVE LITHOTRIPSY (ESWL);  Surgeon: Orson Ape, MD;  Location: [facility];  Service: Urology;  Laterality: Left;   EXTRACORPOREAL SHOCK WAVE LITHOTRIPSY Left 05/11/2022   Procedure: EXTRACORPOREAL SHOCK WAVE LITHOTRIPSY (ESWL);  Surgeon: Orson Ape, MD;  Location: [facility];  Service: Urology;  Laterality: Left;   HERNIA REPAIR  07/17/1998   R & L   VASECTOMY  07/17/1997    Home Medications:  Allergies as of 10/10/2023       Reactions   Milk (cow) Other (See Comments)   Any Milk products...Marland KitchenRunny nose, cough   Viagra [sildenafil Citrate]    Leg pain        Medication List        Accurate as of October 10, 2023  3:17 PM. If you have any questions, ask your nurse or doctor.          albuterol 108 (90 Base) MCG/ACT inhaler Commonly known as: VENTOLIN HFA Inhale 1-2 puffs into the lungs every 6 (six) hours as needed.   cetirizine 10 MG tablet Commonly known as: ZYRTEC Take 10 mg by mouth daily as needed for allergies.   Cialis 5 MG tablet Generic drug: tadalafil TAKE 1 TABLET (5 MG TOTAL) BY MOUTH DAILY AS NEEDED FOR ERECTILE DYSFUNCTION.   DAILY  MULTIVITAMIN PO Take 1 tablet by mouth daily.   EpiPen 2-Pak 0.3 MG/0.3ML Soaj injection Generic drug: EPINEPHrine Inject 0.3 mg into the muscle as needed for anaphylaxis. Reported on 07/06/2015-use as needed   Fish Oil 1200 MG Caps Take by mouth daily.   fluticasone 50 MCG/ACT nasal spray Commonly known as: FLONASE Place 2 sprays into both nostrils daily as needed.   losartan 100 MG tablet Commonly known as: COZAAR TAKE 1 TABLET BY MOUTH EVERY DAY   metFORMIN 500 MG 24 hr tablet Commonly known as: GLUCOPHAGE-XR TAKE 1 TABLET BY MOUTH EVERY DAY WITH BREAKFAST   OneTouch Delica Lancets 33G Misc Check blood sugar twice a day and as directed. Dx E11.9   OneTouch Ultra test strip Generic drug: glucose blood CHECK BLOOD SUGAR TWICE A DAY AND AS DIRECTED. DX E11.9    rosuvastatin 5 MG tablet Commonly known as: Crestor Take one pill by mouth every other day (three days weekly)   Symbicort 80-4.5 MCG/ACT inhaler Generic drug: budesonide-formoterol Inhale 1 puff into the lungs daily as needed.   tamsulosin 0.4 MG Caps capsule Commonly known as: FLOMAX Take 1 capsule (0.4 mg total) by mouth daily.        Allergies:  Allergies  Allergen Reactions   Milk (Cow) Other (See Comments)    Any Milk products...Marland KitchenRunny nose, cough   Viagra [Sildenafil Citrate]     Leg pain    Family History: Family History  Problem Relation Age of Onset   Diabetes Mother    COPD Mother    Benign prostatic hyperplasia Father    Hypertension Father    Prostate cancer Father    Breast cancer Sister 17       died at 4   Prostate cancer Maternal Grandfather    Colon cancer Neg Hx    Esophageal cancer Neg Hx    Liver disease Neg Hx    Stomach cancer Neg Hx    Pancreatic cancer Neg Hx    Colon polyps Neg Hx    Crohn's disease Neg Hx    Rectal cancer Neg Hx    Ulcerative colitis Neg Hx     Social History:  reports that he has quit smoking. His smoking use included cigarettes. He has never been exposed to tobacco smoke. He has never used smokeless tobacco. He reports current alcohol use. He reports that he does not use drugs.   Physical Exam: BP 137/80   Pulse 62   Ht 5' 8.5" (1.74 m)   Wt 222 lb (100.7 kg)   BMI 33.26 kg/m   Constitutional:  Alert and oriented, No acute distress. HEENT: [place] AT, moist mucus membranes.  Trachea midline, no masses. Cardiovascular: No clubbing, cyanosis, or edema. Respiratory: Normal respiratory effort, no increased work of breathing. GI: Abdomen is soft, nontender, nondistended, no abdominal masses GU: Prostate 40 grams, smooth without nodules.  Skin: No rashes, bruises or suspicious lesions. Neurologic: Grossly intact, no focal deficits, moving all 4 extremities. Psychiatric: Normal mood and affect.    Assessment &  Plan:    1. BPH with LUTS Stable on tamsulosin  2. Erectile dysfunction Stable on tadalafil  3. Prostate cancer screening Benign DRE He desired PSA which was drawn today.   4. Personal history of urinary calculi Presently asymptomatic.  Follow up 1 year  [facility] [address] [place], [place] [address] [phone]

## 2023-10-11 ENCOUNTER — Encounter: Payer: Self-pay | Admitting: Urology

## 2023-10-11 LAB — PSA: Prostate Specific Ag, Serum: 1 ng/mL (ref 0.0–4.0)

## 2023-11-05 ENCOUNTER — Ambulatory Visit: Payer: 59 | Admitting: Family Medicine

## 2023-11-08 ENCOUNTER — Encounter: Payer: Self-pay | Admitting: Family Medicine

## 2023-11-08 ENCOUNTER — Ambulatory Visit: Admitting: Family Medicine

## 2023-11-08 VITALS — BP 114/68 | HR 55 | Temp 98.1°F | Ht 68.5 in | Wt 217.1 lb

## 2023-11-08 DIAGNOSIS — Z7984 Long term (current) use of oral hypoglycemic drugs: Secondary | ICD-10-CM | POA: Diagnosis not present

## 2023-11-08 DIAGNOSIS — I10 Essential (primary) hypertension: Secondary | ICD-10-CM

## 2023-11-08 DIAGNOSIS — E785 Hyperlipidemia, unspecified: Secondary | ICD-10-CM

## 2023-11-08 DIAGNOSIS — E1169 Type 2 diabetes mellitus with other specified complication: Secondary | ICD-10-CM | POA: Diagnosis not present

## 2023-11-08 DIAGNOSIS — E119 Type 2 diabetes mellitus without complications: Secondary | ICD-10-CM

## 2023-11-08 LAB — MICROALBUMIN / CREATININE URINE RATIO
Creatinine,U: 79.3 mg/dL
Microalb Creat Ratio: UNDETERMINED mg/g (ref 0.0–30.0)
Microalb, Ur: <0.7 mg/dL

## 2023-11-08 LAB — POCT GLYCOSYLATED HEMOGLOBIN (HGB A1C): Hemoglobin A1C: 8 % — AB (ref 4.0–5.6)

## 2023-11-08 MED ORDER — METFORMIN HCL ER 500 MG PO TB24: 500.0000 mg | ORAL_TABLET | Freq: Two times a day (BID) | ORAL | 1 refills | Status: DC

## 2023-11-08 NOTE — Patient Instructions (Addendum)
 Keep walking If you can  Add some strength training to your routine, this is important for bone and brain health and can reduce your risk of falls and help your body use insulin properly and regulate weight  Light weights, exercise bands , and internet videos are a good way to start  Yoga (chair or regular), machines , floor exercises or a gym with machines are also good options   Go up on metformin  xr 500 mg to twice daily  If diarrhea is intolerable let us  know  Keep working on healthy diet   Urine microalbumin (protein) today for kidney health with diabetes   Try to get most of your carbohydrates from produce (with the exception of white potatoes) and whole grains Eat less bread/pasta/rice/snack foods/cereals/sweets and other items from the middle of the grocery store (processed carbs)   Follow up in 3 months

## 2023-11-08 NOTE — Progress Notes (Signed)
 Subjective:    Patient ID: Terry Dixon, male    DOB: February 23, 1961, 63 y.o.   MRN: [id number]  HPI  Wt Readings from Last 3 Encounters:  11/08/23 217 lb 2 oz (98.5 kg)  10/10/23 222 lb (100.7 kg)  08/06/23 222 lb 8 oz (100.9 kg)   32.53 kg/m  Down 5 lb   Vitals:   11/08/23 1012  BP: 114/68  Pulse: (!) 55  Temp: 98.1 F (36.7 C)  SpO2: 97%    Pt presents for follow up of DM2 and HTN and chronic medical problems  Working very long hours  Is tired  Taking care of himself   HTN bp is stable today  No cp or palpitations or headaches or edema  No side effects to medicines  BP Readings from Last 3 Encounters:  11/08/23 114/68  10/10/23 137/80  08/06/23 122/76    Losartan  100 mg daily   Lab Results  Component Value Date   NA 138 07/20/2023   K 3.9 07/20/2023   CO2 25 07/20/2023   GLUCOSE 92 07/20/2023   BUN 12 07/20/2023   CREATININE 0.95 07/20/2023   CALCIUM  9.5 07/20/2023   GFR 88.83 04/04/2023   GFRNONAA >60 12/07/2021     DM2 Lab Results  Component Value Date   HGBA1C 8.0 (A) 11/08/2023   HGBA1C 8.8 (H) 07/20/2023   HGBA1C 7.2 (H) 04/04/2023   Metformin  XR 500 mg daily (diarrhea with higher doses) -he wants to try going up on the dose  Declined GLP-1 med last time / fear of side effects  Planned to watch diet more carefully   One meal per day -usually salad with chicken  Carry out -trying to choose better foods  2 snacks -mainly peanuts   He tries to fit in exercise if he has day off  Goes to [employer]  No days off until sept (has vacation in June)   Lot of walking at work    Eye exam- was about a month ago   Lab Results  Component Value Date   LABMICR See below: 10/10/2023   MICROALBUR 4.7 (H) 10/09/2022     Hyperlipidemia Lab Results  Component Value Date   CHOL 129 07/20/2023   HDL 40 07/20/2023   LDLCALC 70 07/20/2023   LDLDIRECT 115.6 11/17/2010   TRIG 100 07/20/2023   CHOLHDL 3.2 07/20/2023   Crestor  5 mg  three times weekly (is most tolerated)   Patient Active Problem List   Diagnosis Date Noted   Asthma 08/06/2023   Sleep apnea 08/06/2023   Encounter for hepatitis C screening test for low risk patient 08/06/2023   Encounter for screening for HIV 08/06/2023   Pseudopolyposis of colon without complication, unspecified part of colon (HCC) 10/09/2022   BPH (benign prostatic hyperplasia) 02/16/2021   Diabetes mellitus type 2, controlled (HCC) 03/11/2016   Prostate cancer screening 12/09/2014   Steatohepatitis, non-alcoholic 08/06/2013   Colon cancer screening 05/31/2012   Low serum testosterone level 11/28/2011   Routine general medical examination at a health care facility 11/23/2011   Family history of non-anemic vitamin B12 deficiency 05/22/2011   ED (erectile dysfunction) 05/26/2008   Class 1 obesity due to excess calories with serious comorbidity and body mass index (BMI) of 33.0 to 33.9 in adult 01/03/2007   Hyperlipidemia associated with type 2 diabetes mellitus (HCC) 01/01/2007   Essential hypertension 01/01/2007   Diverticulosis of colon 01/01/2007   Past Medical History:  Diagnosis Date   Allergy  mold,rag weed,   Asthma    Diverticulitis    colon    ED (erectile dysfunction)    Family history of non-anemic vitamin B12 deficiency 05/22/2011   Hyperlipidemia    Hypertension    Low testosterone    NASH (nonalcoholic steatohepatitis)    Obesity    Seasonal allergies    Sleep apnea    not used c pap in 6 month,updated 05/30/22   Past Surgical History:  Procedure Laterality Date   COLONOSCOPY     EXTRACORPOREAL SHOCK WAVE LITHOTRIPSY Left 12/22/2021   Procedure: EXTRACORPOREAL SHOCK WAVE LITHOTRIPSY (ESWL);  Surgeon: Rea Cambridge, MD;  Location: [facility];  Service: Urology;  Laterality: Left;   EXTRACORPOREAL SHOCK WAVE LITHOTRIPSY Left 05/11/2022   Procedure: EXTRACORPOREAL SHOCK WAVE LITHOTRIPSY (ESWL);  Surgeon: Rea Cambridge, MD;  Location: [facility];   Service: Urology;  Laterality: Left;   HERNIA REPAIR  07/17/1998   R & L   VASECTOMY  07/17/1997   Social History   Tobacco Use   Smoking status: Former    Types: Cigarettes    Passive exposure: Never   Smokeless tobacco: Never   Tobacco comments:    age 75 for 1 year   Vaping Use   Vaping status: Never Used  Substance Use Topics   Alcohol use: Yes    Comment: very rarely   Drug use: No   Family History  Problem Relation Age of Onset   Diabetes Mother    COPD Mother    Benign prostatic hyperplasia Father    Hypertension Father    Prostate cancer Father    Breast cancer Sister 75       died at 30   Prostate cancer Maternal Grandfather    Colon cancer Neg Hx    Esophageal cancer Neg Hx    Liver disease Neg Hx    Stomach cancer Neg Hx    Pancreatic cancer Neg Hx    Colon polyps Neg Hx    Crohn's disease Neg Hx    Rectal cancer Neg Hx    Ulcerative colitis Neg Hx    Allergies  Allergen Reactions   Milk (Cow) Other (See Comments)    Any Milk products...Aaron AasRunny nose, cough   Viagra  [Sildenafil  Citrate]     Leg pain   Current Outpatient Medications on File Prior to Visit  Medication Sig Dispense Refill   albuterol  (VENTOLIN  HFA) 108 (90 Base) MCG/ACT inhaler Inhale 1-2 puffs into the lungs every 6 (six) hours as needed. 18 g 1   cetirizine (ZYRTEC) 10 MG tablet Take 10 mg by mouth daily as needed for allergies.     CIALIS  5 MG tablet TAKE 1 TABLET (5 MG TOTAL) BY MOUTH DAILY AS NEEDED FOR ERECTILE DYSFUNCTION. 10 tablet 5   EPIPEN  2-PAK 0.3 MG/0.3ML SOAJ injection Inject 0.3 mg into the muscle as needed for anaphylaxis. Reported on 07/06/2015-use as needed 1 each 11   fluticasone (FLONASE) 50 MCG/ACT nasal spray Place 2 sprays into both nostrils daily as needed.   19   losartan  (COZAAR ) 100 MG tablet TAKE 1 TABLET BY MOUTH EVERY DAY 90 tablet 1   Multiple Vitamin (DAILY MULTIVITAMIN PO) Take 1 tablet by mouth daily.     Omega-3 Fatty Acids (FISH OIL) 1200 MG CAPS Take  by mouth daily.     ONETOUCH DELICA LANCETS 33G MISC Check blood sugar twice a day and as directed. Dx E11.9 100 each 3   ONETOUCH ULTRA test strip CHECK BLOOD  SUGAR TWICE A DAY AND AS DIRECTED. DX E11.9 200 strip 1   rosuvastatin  (CRESTOR ) 5 MG tablet Take one pill by mouth every other day (three days weekly) 36 tablet 3   SYMBICORT 80-4.5 MCG/ACT inhaler Inhale 1 puff into the lungs daily as needed.     tamsulosin  (FLOMAX ) 0.4 MG CAPS capsule Take 1 capsule (0.4 mg total) by mouth daily. 30 capsule 3   No current facility-administered medications on file prior to visit.    Review of Systems  Constitutional:  Positive for fatigue. Negative for activity change, appetite change, fever and unexpected weight change.  HENT:  Negative for congestion, rhinorrhea, sore throat and trouble swallowing.   Eyes:  Negative for pain, redness, itching and visual disturbance.  Respiratory:  Negative for cough, chest tightness, shortness of breath and wheezing.   Cardiovascular:  Negative for chest pain and palpitations.  Gastrointestinal:  Negative for abdominal pain, blood in stool, constipation, diarrhea and nausea.  Endocrine: Negative for cold intolerance, heat intolerance, polydipsia and polyuria.  Genitourinary:  Negative for difficulty urinating, dysuria, frequency and urgency.  Musculoskeletal:  Negative for arthralgias, joint swelling and myalgias.  Skin:  Negative for pallor and rash.  Neurological:  Negative for dizziness, tremors, weakness, numbness and headaches.  Hematological:  Negative for adenopathy. Does not bruise/bleed easily.  Psychiatric/Behavioral:  Negative for decreased concentration and dysphoric mood. The patient is not nervous/anxious.        Objective:   Physical Exam Constitutional:      General: He is not in acute distress.    Appearance: Normal appearance. He is well-developed. He is obese. He is not ill-appearing or diaphoretic.  HENT:     Head: Normocephalic and  atraumatic.  Eyes:     Conjunctiva/sclera: Conjunctivae normal.     Pupils: Pupils are equal, round, and reactive to light.  Neck:     Thyroid : No thyromegaly.     Vascular: No carotid bruit or JVD.  Cardiovascular:     Rate and Rhythm: Normal rate and regular rhythm.     Heart sounds: Normal heart sounds.     No gallop.  Pulmonary:     Effort: Pulmonary effort is normal. No respiratory distress.     Breath sounds: Normal breath sounds. No wheezing or rales.  Abdominal:     General: There is no distension or abdominal bruit.     Palpations: Abdomen is soft.  Musculoskeletal:     Cervical back: Normal range of motion and neck supple.     Right lower leg: No edema.     Left lower leg: No edema.  Lymphadenopathy:     Cervical: No cervical adenopathy.  Skin:    General: Skin is warm and dry.     Coloration: Skin is not pale.     Findings: No rash.  Neurological:     Mental Status: He is alert.     Coordination: Coordination normal.     Deep Tendon Reflexes: Reflexes are normal and symmetric. Reflexes normal.  Psychiatric:        Mood and Affect: Mood normal.           Assessment & Plan:   Problem List Items Addressed This Visit       Cardiovascular and Mediastinum   Essential hypertension   bp in fair control at this time  Back today after losing insurance for a while BP Readings from Last 1 Encounters:  11/08/23 114/68   No changes needed Most recent labs reviewed  Disc lifstyle change with low sodium diet and exercise  Plan to continue losartan  100 mg daily         Endocrine   Hyperlipidemia associated with type 2 diabetes mellitus (HCC)   Disc goals for lipids and reasons to control them Rev last labs with pt Rev low sat fat diet in detail  LDL of 70 and HDL slightly improvement Tolerates crestor  5 mg three times weekly with some muscle aches/ wants to continue   Will continue to monitor       Relevant Medications   metFORMIN  (GLUCOPHAGE -XR) 500  MG 24 hr tablet   Diabetes mellitus type 2, controlled (HCC) - Primary   Lab Results  Component Value Date   HGBA1C 8.0 (A) 11/08/2023   HGBA1C 8.8 (H) 07/20/2023   HGBA1C 7.2 (H) 04/04/2023   Improved a bit/also 5 lb weight loss  Eating better /commended Working very long hours /no time for much exercise   Pt declines GLP-1 Discussed other options   He would like to increase metformin  to see if he can tolerate it (diarrhea wise)  Will increase metformin  xr 500 mg to twice daily  Encouraged to call if not tolerated   Follow up 3 mo Sent for eye exam report Microalbumin test today  Taking statin       Relevant Medications   metFORMIN  (GLUCOPHAGE -XR) 500 MG 24 hr tablet   Other Relevant Orders   POCT HgB A1C (Completed)   Microalbumin / creatinine urine ratio

## 2023-11-08 NOTE — Assessment & Plan Note (Signed)
 Disc goals for lipids and reasons to control them Rev last labs with pt Rev low sat fat diet in detail  LDL of 70 and HDL slightly improvement Tolerates crestor 5 mg three times weekly with some muscle aches/ wants to continue   Will continue to monitor

## 2023-11-08 NOTE — Assessment & Plan Note (Signed)
 Lab Results  Component Value Date   HGBA1C 8.0 (A) 11/08/2023   HGBA1C 8.8 (H) 07/20/2023   HGBA1C 7.2 (H) 04/04/2023   Improved a bit/also 5 lb weight loss  Eating better /commended Working very long hours /no time for much exercise   Pt declines GLP-1 Discussed other options   He would like to increase metformin  to see if he can tolerate it (diarrhea wise)  Will increase metformin  xr 500 mg to twice daily  Encouraged to call if not tolerated   Follow up 3 mo Sent for eye exam report Microalbumin test today  Taking statin

## 2023-11-08 NOTE — Assessment & Plan Note (Signed)
 bp in fair control at this time  Back today after losing insurance for a while BP Readings from Last 1 Encounters:  11/08/23 114/68   No changes needed Most recent labs reviewed  Disc lifstyle change with low sodium diet and exercise  Plan to continue losartan  100 mg daily

## 2023-11-12 ENCOUNTER — Other Ambulatory Visit: Payer: Self-pay | Admitting: Family Medicine

## 2023-11-20 ENCOUNTER — Telehealth: Payer: Self-pay | Admitting: Family Medicine

## 2023-11-20 NOTE — Telephone Encounter (Signed)
 Pt brought by ppw he received from [facility]. Pt states the ppw states [facility] won't no longer cover [facility] ULTRA test strip anymore. Pt states he filled out a portion of the ppw, Dr. Malissa Se may have to fill a portion out too. Pt states there are 3 choices of test strips [facility] will cover that Dr. Malissa Se can start prescribing for him. Ppw Is in dr. Belva Boyden folder. Call back # [phone]

## 2023-11-20 NOTE — Telephone Encounter (Signed)
 Form in your inbox

## 2023-11-20 NOTE — Telephone Encounter (Signed)
 It indicates that accu check guide strips and kits are covered  I generally sent a generic prescription to pharmacy asking them to provide something covered   I need to know if he just needs strips or if he needs the meter /strips/lancets  (Unsure if the new brand of strips will work with his present meter- I have no clue) May have to ask pharm if pt does not know

## 2023-11-22 MED ORDER — ACCU-CHEK SOFTCLIX LANCETS MISC: 2 refills | Status: AC

## 2023-11-22 MED ORDER — ACCU-CHEK GUIDE W/DEVICE KIT: PACK | 0 refills | Status: AC

## 2023-11-22 MED ORDER — ACCU-CHEK GUIDE TEST VI STRP: ORAL_STRIP | 2 refills | Status: DC

## 2023-11-22 NOTE — Addendum Note (Signed)
 Addended by: Queenie Brunet on: 11/22/2023 11:47 AM   Modules accepted: Orders

## 2023-11-22 NOTE — Telephone Encounter (Signed)
 Thanks for sending those

## 2023-11-22 NOTE — Addendum Note (Signed)
 Addended by: Queenie Brunet on: 11/22/2023 12:56 PM   Modules accepted: Orders

## 2023-11-22 NOTE — Telephone Encounter (Signed)
 Pt will need everything new meter, strips, and lancets   [facility] [place],  Pt is okay using whatever PCP send in and insurance covers

## 2023-12-27 ENCOUNTER — Other Ambulatory Visit: Payer: Self-pay | Admitting: Family Medicine

## 2024-02-07 ENCOUNTER — Other Ambulatory Visit: Payer: Self-pay | Admitting: Family Medicine

## 2024-02-08 ENCOUNTER — Ambulatory Visit: Admitting: Family Medicine

## 2024-02-08 ENCOUNTER — Encounter: Payer: Self-pay | Admitting: Family Medicine

## 2024-02-08 VITALS — BP 122/70 | HR 59 | Temp 97.7°F | Ht 68.5 in | Wt 214.0 lb

## 2024-02-08 DIAGNOSIS — E1169 Type 2 diabetes mellitus with other specified complication: Secondary | ICD-10-CM | POA: Diagnosis not present

## 2024-02-08 DIAGNOSIS — I10 Essential (primary) hypertension: Secondary | ICD-10-CM

## 2024-02-08 DIAGNOSIS — Z7984 Long term (current) use of oral hypoglycemic drugs: Secondary | ICD-10-CM

## 2024-02-08 DIAGNOSIS — E785 Hyperlipidemia, unspecified: Secondary | ICD-10-CM | POA: Diagnosis not present

## 2024-02-08 DIAGNOSIS — E119 Type 2 diabetes mellitus without complications: Secondary | ICD-10-CM

## 2024-02-08 LAB — POCT GLYCOSYLATED HEMOGLOBIN (HGB A1C): Hemoglobin A1C: 7.7 % — AB (ref 4.0–5.6)

## 2024-02-08 MED ORDER — METFORMIN HCL ER 500 MG PO TB24: 1000.0000 mg | ORAL_TABLET | Freq: Two times a day (BID) | ORAL | 1 refills | Status: AC

## 2024-02-08 NOTE — Assessment & Plan Note (Signed)
 bp in fair control at this time  Back today after losing insurance for a while BP Readings from Last 1 Encounters:  02/08/24 122/70   No changes needed Most recent labs reviewed  Disc lifstyle change with low sodium diet and exercise  Plan to continue losartan  100 mg daily

## 2024-02-08 NOTE — Assessment & Plan Note (Signed)
 Disc goals for lipids and reasons to control them Rev last labs with pt Rev low sat fat diet in detail  LDL of 70 and HDL slightly improvement Tolerates crestor 5 mg three times weekly with some muscle aches/ wants to continue   Will continue to monitor

## 2024-02-08 NOTE — Assessment & Plan Note (Signed)
 Lab Results  Component Value Date   HGBA1C 7.7 (A) 02/08/2024   HGBA1C 8.0 (A) 11/08/2023   HGBA1C 8.8 (H) 07/20/2023   Starting to improve  Will increase metformin  xr from 500 to 1000 mg bid  Instructed to call if side effects or problems Follow up 3 mo for visit with labs  Encouraged to keep working on health habits Check some levels 2 h pp  Exercise when able Low glycemic diet  Continue arb and statin  Microalb utd Sent for eye exam rep from [facility]

## 2024-02-08 NOTE — Patient Instructions (Addendum)
 Try to stick to diabetic (low glycemic) diet with lean protein Try to get most of your carbohydrates from produce (with the exception of white potatoes) and whole grains Eat less bread/pasta/rice/snack foods/cereals/sweets and other items from the middle of the grocery store (processed carbs)   Keep watching blood glucose  Check some levels 2 hours after eating   Go up on metformin  XR to 2 pills twice daily (1000 mg twice daily)  Follow up in 3 months

## 2024-02-08 NOTE — Progress Notes (Signed)
 Subjective:    Patient ID: Terry Dixon, male    DOB: Feb 06, 1961, 63 y.o.   MRN: [id number]  HPI  Wt Readings from Last 3 Encounters:  02/08/24 214 lb (97.1 kg)  11/08/23 217 lb 2 oz (98.5 kg)  10/10/23 222 lb (100.7 kg)   32.07 kg/m  Vitals:   02/08/24 1357  BP: 122/70  Pulse: (!) 59  Temp: 97.7 F (36.5 C)  SpO2: 95%   Weight is down 3 lb     Pt presents for follow up of chronic medical problems including  DM2 HTN Hyperlipidemia  HTN bp is stable today  No cp or palpitations or headaches or edema  No side effects to medicines  BP Readings from Last 3 Encounters:  02/08/24 122/70  11/08/23 114/68  10/10/23 137/80    Checking blood pressure at home  Running good / better than expected     Lab Results  Component Value Date   NA 138 07/20/2023   K 3.9 07/20/2023   CO2 25 07/20/2023   GLUCOSE 92 07/20/2023   BUN 12 07/20/2023   CREATININE 0.95 07/20/2023   CALCIUM  9.5 07/20/2023   GFR 88.83 04/04/2023   GFRNONAA >60 12/07/2021   Losartan  100 mg daily   DM2 Diabetes Home sugar results  Have been good - 110 today   Very long work hours -hard to take care of himself  Did have birthday 4th of July picnic also   DM diet - sandwhich  Eats veggies for one meal  Better portions  Getting protein   Exercise : Up and down stairs all day  Lot of walking  Fair amt of kneeling      Lab Results  Component Value Date   HGBA1C 7.7 (A) 02/08/2024   HGBA1C 8.0 (A) 11/08/2023   HGBA1C 8.8 (H) 07/20/2023   Down to 7.7 today  Starting to make progress   Lab Results  Component Value Date   LABMICR See below: 10/10/2023   MICROALBUR <0.7 11/08/2023    Renal protection- arb  Last eye exam  - just had it in past several mo in     Metformin  xr 500 mg increased to bid last time-tolerating it well  Pt declined glp-1 med   Hyperlipidemia Lab Results  Component Value Date   CHOL 129 07/20/2023   HDL 40 07/20/2023   LDLCALC 70  07/20/2023   LDLDIRECT 115.6 11/17/2010   TRIG 100 07/20/2023   CHOLHDL 3.2 07/20/2023  LDL of 70 Crestor  10 mg daily       Patient Active Problem List   Diagnosis Date Noted   Asthma 08/06/2023   Sleep apnea 08/06/2023   Encounter for hepatitis C screening test for low risk patient 08/06/2023   Encounter for screening for HIV 08/06/2023   Pseudopolyposis of colon without complication, unspecified part of colon (HCC) 10/09/2022   BPH (benign prostatic hyperplasia) 02/16/2021   Diabetes mellitus type 2, controlled (HCC) 03/11/2016   Prostate cancer screening 12/09/2014   Steatohepatitis, non-alcoholic 08/06/2013   Colon cancer screening 05/31/2012   Low serum testosterone level 11/28/2011   Routine general medical examination at a health care facility 11/23/2011   Family history of non-anemic vitamin B12 deficiency 05/22/2011   ED (erectile dysfunction) 05/26/2008   Class 1 obesity due to excess calories with serious comorbidity and body mass index (BMI) of 33.0 to 33.9 in adult 01/03/2007   Hyperlipidemia associated with type 2 diabetes mellitus (HCC) 01/01/2007   Essential hypertension 01/01/2007  Diverticulosis of colon 01/01/2007   Past Medical History:  Diagnosis Date   Allergy    mold,rag weed,   Asthma    Diverticulitis    colon    ED (erectile dysfunction)    Family history of non-anemic vitamin B12 deficiency 05/22/2011   Hyperlipidemia    Hypertension    Low testosterone    NASH (nonalcoholic steatohepatitis)    Obesity    Seasonal allergies    Sleep apnea    not used c pap in 6 month,updated 05/30/22   Past Surgical History:  Procedure Laterality Date   COLONOSCOPY     EXTRACORPOREAL SHOCK WAVE LITHOTRIPSY Left 12/22/2021   Procedure: EXTRACORPOREAL SHOCK WAVE LITHOTRIPSY (ESWL);  Surgeon: Kassie Ozell SAUNDERS, MD;  Location: [facility];  Service: Urology;  Laterality: Left;   EXTRACORPOREAL SHOCK WAVE LITHOTRIPSY Left 05/11/2022   Procedure:  EXTRACORPOREAL SHOCK WAVE LITHOTRIPSY (ESWL);  Surgeon: Kassie Ozell SAUNDERS, MD;  Location: [facility];  Service: Urology;  Laterality: Left;   HERNIA REPAIR  07/17/1998   R & L   VASECTOMY  07/17/1997   Social History   Tobacco Use   Smoking status: Former    Types: Cigarettes    Passive exposure: Never   Smokeless tobacco: Never   Tobacco comments:    age 34 for 1 year   Vaping Use   Vaping status: Never Used  Substance Use Topics   Alcohol use: Yes    Comment: very rarely   Drug use: No   Family History  Problem Relation Age of Onset   Diabetes Mother    COPD Mother    Benign prostatic hyperplasia Father    Hypertension Father    Prostate cancer Father    Breast cancer Sister 57       died at 85   Prostate cancer Maternal Grandfather    Colon cancer Neg Hx    Esophageal cancer Neg Hx    Liver disease Neg Hx    Stomach cancer Neg Hx    Pancreatic cancer Neg Hx    Colon polyps Neg Hx    Crohn's disease Neg Hx    Rectal cancer Neg Hx    Ulcerative colitis Neg Hx    Allergies  Allergen Reactions   Milk (Cow) Other (See Comments)    Any Milk products...SABRARunny nose, cough   Viagra  [Sildenafil  Citrate]     Leg pain   Current Outpatient Medications on File Prior to Visit  Medication Sig Dispense Refill   Accu-Chek Softclix Lancets lancets Use to Check blood sugar twice a day (Dx E11.9) 100 each 2   albuterol  (VENTOLIN  HFA) 108 (90 Base) MCG/ACT inhaler Inhale 1-2 puffs into the lungs every 6 (six) hours as needed. 18 g 1   Blood Glucose Monitoring Suppl (ACCU-CHEK GUIDE) w/Device KIT Use to Check blood sugar twice a day (Dx E11.9) 1 kit 0   cetirizine (ZYRTEC) 10 MG tablet Take 10 mg by mouth daily as needed for allergies.     CIALIS  5 MG tablet TAKE 1 TABLET (5 MG TOTAL) BY MOUTH DAILY AS NEEDED FOR ERECTILE DYSFUNCTION. 10 tablet 5   EPIPEN  2-PAK 0.3 MG/0.3ML SOAJ injection Inject 0.3 mg into the muscle as needed for anaphylaxis. Reported on 07/06/2015-use as needed 1  each 11   fluticasone (FLONASE) 50 MCG/ACT nasal spray Place 2 sprays into both nostrils daily as needed.   19   glucose blood (ACCU-CHEK GUIDE TEST) test strip Use to Check blood sugar twice a day (  Dx E11.9) 100 each 2   losartan  (COZAAR ) 100 MG tablet TAKE 1 TABLET BY MOUTH EVERY DAY 90 tablet 1   Multiple Vitamin (DAILY MULTIVITAMIN PO) Take 1 tablet by mouth daily.     Omega-3 Fatty Acids (FISH OIL) 1200 MG CAPS Take by mouth daily.     rosuvastatin  (CRESTOR ) 5 MG tablet Take one pill by mouth every other day (three days weekly) 36 tablet 3   SYMBICORT 80-4.5 MCG/ACT inhaler Inhale 1 puff into the lungs daily as needed.     tamsulosin  (FLOMAX ) 0.4 MG CAPS capsule TAKE 1 CAPSULE BY MOUTH EVERY DAY 90 capsule 1   No current facility-administered medications on file prior to visit.    Review of Systems  Constitutional:  Negative for activity change, appetite change, fatigue, fever and unexpected weight change.  HENT:  Negative for congestion, rhinorrhea, sore throat and trouble swallowing.   Eyes:  Negative for pain, redness, itching and visual disturbance.  Respiratory:  Negative for cough, chest tightness, shortness of breath and wheezing.   Cardiovascular:  Negative for chest pain and palpitations.  Gastrointestinal:  Negative for abdominal pain, blood in stool, constipation, diarrhea and nausea.  Endocrine: Negative for cold intolerance, heat intolerance, polydipsia and polyuria.  Genitourinary:  Negative for difficulty urinating, dysuria, frequency and urgency.  Musculoskeletal:  Negative for arthralgias, joint swelling and myalgias.  Skin:  Negative for pallor and rash.  Neurological:  Negative for dizziness, tremors, weakness, numbness and headaches.  Hematological:  Negative for adenopathy. Does not bruise/bleed easily.  Psychiatric/Behavioral:  Negative for decreased concentration and dysphoric mood. The patient is not nervous/anxious.        Objective:   Physical  Exam Constitutional:      General: He is not in acute distress.    Appearance: Normal appearance. He is well-developed. He is obese. He is not ill-appearing or diaphoretic.  HENT:     Head: Normocephalic and atraumatic.  Eyes:     Conjunctiva/sclera: Conjunctivae normal.     Pupils: Pupils are equal, round, and reactive to light.  Neck:     Thyroid : No thyromegaly.     Vascular: No carotid bruit or JVD.  Cardiovascular:     Rate and Rhythm: Normal rate and regular rhythm.     Heart sounds: Normal heart sounds.     No gallop.  Pulmonary:     Effort: Pulmonary effort is normal. No respiratory distress.     Breath sounds: Normal breath sounds. No wheezing or rales.  Abdominal:     General: There is no distension or abdominal bruit.     Palpations: Abdomen is soft.     Comments: Umbilical hernia   Musculoskeletal:     Cervical back: Normal range of motion and neck supple.     Right lower leg: No edema.     Left lower leg: No edema.  Lymphadenopathy:     Cervical: No cervical adenopathy.  Skin:    General: Skin is warm and dry.     Coloration: Skin is not pale.     Findings: No rash.  Neurological:     Mental Status: He is alert.     Coordination: Coordination normal.     Deep Tendon Reflexes: Reflexes are normal and symmetric. Reflexes normal.  Psychiatric:        Mood and Affect: Mood normal.           Assessment & Plan:   Problem List Items Addressed This Visit  Cardiovascular and Mediastinum   Essential hypertension   bp in fair control at this time  Back today after losing insurance for a while BP Readings from Last 1 Encounters:  02/08/24 122/70   No changes needed Most recent labs reviewed  Disc lifstyle change with low sodium diet and exercise  Plan to continue losartan  100 mg daily         Endocrine   Hyperlipidemia associated with type 2 diabetes mellitus (HCC)   Disc goals for lipids and reasons to control them Rev last labs with pt Rev  low sat fat diet in detail  LDL of 70 and HDL slightly improvement Tolerates crestor  5 mg three times weekly with some muscle aches/ wants to continue   Will continue to monitor       Relevant Medications   metFORMIN  (GLUCOPHAGE -XR) 500 MG 24 hr tablet   Diabetes mellitus type 2, controlled (HCC) - Primary   Lab Results  Component Value Date   HGBA1C 7.7 (A) 02/08/2024   HGBA1C 8.0 (A) 11/08/2023   HGBA1C 8.8 (H) 07/20/2023   Starting to improve  Will increase metformin  xr from 500 to 1000 mg bid  Instructed to call if side effects or problems Follow up 3 mo for visit with labs  Encouraged to keep working on health habits Check some levels 2 h pp  Exercise when able Low glycemic diet  Continue arb and statin  Microalb utd Sent for eye exam rep from [facility]       Relevant Medications   metFORMIN  (GLUCOPHAGE -XR) 500 MG 24 hr tablet   Other Relevant Orders   POCT HgB A1C (Completed)

## 2024-02-17 ENCOUNTER — Other Ambulatory Visit: Payer: Self-pay | Admitting: Family Medicine

## 2024-02-19 ENCOUNTER — Other Ambulatory Visit: Payer: Self-pay | Admitting: Family Medicine

## 2024-03-02 ENCOUNTER — Other Ambulatory Visit: Payer: Self-pay | Admitting: Family Medicine

## 2024-04-10 ENCOUNTER — Other Ambulatory Visit (HOSPITAL_COMMUNITY): Payer: Self-pay

## 2024-05-13 ENCOUNTER — Encounter: Payer: Self-pay | Admitting: Family Medicine

## 2024-05-13 ENCOUNTER — Ambulatory Visit: Admitting: Family Medicine

## 2024-05-13 VITALS — BP 110/72 | HR 55 | Temp 97.9°F | Ht 68.5 in | Wt 217.5 lb

## 2024-05-13 DIAGNOSIS — I10 Essential (primary) hypertension: Secondary | ICD-10-CM | POA: Diagnosis not present

## 2024-05-13 DIAGNOSIS — E6609 Other obesity due to excess calories: Secondary | ICD-10-CM

## 2024-05-13 DIAGNOSIS — E1169 Type 2 diabetes mellitus with other specified complication: Secondary | ICD-10-CM

## 2024-05-13 DIAGNOSIS — E119 Type 2 diabetes mellitus without complications: Secondary | ICD-10-CM | POA: Diagnosis not present

## 2024-05-13 DIAGNOSIS — E66811 Obesity, class 1: Secondary | ICD-10-CM

## 2024-05-13 DIAGNOSIS — E785 Hyperlipidemia, unspecified: Secondary | ICD-10-CM

## 2024-05-13 DIAGNOSIS — Z7984 Long term (current) use of oral hypoglycemic drugs: Secondary | ICD-10-CM | POA: Insufficient documentation

## 2024-05-13 DIAGNOSIS — K7581 Nonalcoholic steatohepatitis (NASH): Secondary | ICD-10-CM

## 2024-05-13 DIAGNOSIS — Z6833 Body mass index (BMI) 33.0-33.9, adult: Secondary | ICD-10-CM

## 2024-05-13 LAB — POCT GLYCOSYLATED HEMOGLOBIN (HGB A1C): Hemoglobin A1C: 9 % — AB (ref 4.0–5.6)

## 2024-05-13 MED ORDER — TIRZEPATIDE 2.5 MG/0.5ML ~~LOC~~ SOAJ: 2.5000 mg | SUBCUTANEOUS | 0 refills | Status: DC

## 2024-05-13 NOTE — Assessment & Plan Note (Signed)
 Lab Results  Component Value Date   HGBA1C 9.0 (A) 05/13/2024   HGBA1C 7.7 (A) 02/08/2024   HGBA1C 8.0 (A) 11/08/2023   Microalbumin utd  Up  Diet fair  Long work hours  Interested in glp-1 Sent mounjaro 2.5 mg weekly  Disc option of GLP medication including possible side effects like GI intolerance and risk of thyroid  and endocrine cancer, pancreatitis and gallstones, kidney problems and diabetic retinopathy Handout given   Will call when he gets medication Then hopes to get CGM sent in also and plan follow up   Reviewed low glycemic diet  Also goals for strength training (more important when on glp-1)

## 2024-05-13 NOTE — Assessment & Plan Note (Signed)
 bp in fair control at this time  Back today after losing insurance for a while BP Readings from Last 1 Encounters:  05/13/24 110/72   No changes needed Most recent labs reviewed  Disc lifstyle change with low sodium diet and exercise  Plan to continue losartan  100 mg daily

## 2024-05-13 NOTE — Assessment & Plan Note (Signed)
 Disc goals for lipids and reasons to control them Rev last labs with pt Rev low sat fat diet in detail  LDL of 70 and HDL slightly improvement Tolerates crestor 5 mg three times weekly with some muscle aches/ wants to continue   Will continue to monitor

## 2024-05-13 NOTE — Assessment & Plan Note (Signed)
 Stable liver tests No etoh or tylenol  Working hard on wt loss -commended  No symptoms Will continue to follow  Looking to see if GLP-1 would be covered for DM2 and obesity

## 2024-05-13 NOTE — Assessment & Plan Note (Signed)
 Bmi of 32.5  Central obesity  Out of control dm  Also fatty liver   Discussed how this problem influences overall health and the risks it imposes  Reviewed plan for weight loss with lower calorie diet (via better food choices (lower glycemic and portion control) along with exercise building up to or more than 30 minutes 5 days per week including some aerobic activity and strength training    Would benefit from glp-1 med for DM2, fatty liver and obesity

## 2024-05-13 NOTE — Patient Instructions (Addendum)
 I am sending in a prescription for mounjaro and see if it gets covered  Injection - once weekly for diabetes and also obesity   Once you get it / let us  know before you start it  Then I will send a prescription for the continuous glucose monitor and see if this is covered as well   It is very important to do strength building exercise to prevent muscle loss Add some strength training to your routine, this is important for bone and brain health and can reduce your risk of falls and help your body use insulin properly and regulate weight  Light weights, exercise bands , and internet videos are a good way to start  Yoga (chair or regular), machines , floor exercises or a gym with machines are also good options      If you have any intolerable side effects -stop it and let us  know   Keep working on diet  Avoid added sugars in your diet when you can  Try to get most of your carbohydrates from produce (with the exception of white potatoes) and whole grains Eat less bread/pasta/rice/snack foods/cereals/sweets and other items from the middle of the grocery store (processed carbs)  Keep working on diet   We will schedule a follow up once you get started on new medication

## 2024-05-13 NOTE — Assessment & Plan Note (Signed)
 Metformin   Hoping to add injectable glp-1 if covered soon

## 2024-05-13 NOTE — Progress Notes (Signed)
 Subjective:    Patient ID: Terry Dixon, male    DOB: Jan 12, 1961, 63 y.o.   MRN: [id number]  HPI  Wt Readings from Last 3 Encounters:  05/13/24 217 lb 8 oz (98.7 kg)  02/08/24 214 lb (97.1 kg)  11/08/23 217 lb 2 oz (98.5 kg)   32.59 kg/m  Vitals:   05/13/24 1400  BP: 110/72  Pulse: (!) 55  Temp: 97.9 F (36.6 C)  SpO2: 96%    Pt presents for follow up of chronic health problem DM2 HTN Hyperlipidemia   Feeling ok overall  Still working 12 hour shifts 7 days per week  This is hard but loves his job    HTN bp is stable today  No cp or palpitations or headaches or edema  No side effects to medicines  BP Readings from Last 3 Encounters:  05/13/24 110/72  02/08/24 122/70  11/08/23 114/68    Losartan  100 mg daily    Lab Results  Component Value Date   NA 138 07/20/2023   K 3.9 07/20/2023   CO2 25 07/20/2023   GLUCOSE 92 07/20/2023   BUN 12 07/20/2023   CREATININE 0.95 07/20/2023   CALCIUM  9.5 07/20/2023   GFR 88.83 04/04/2023   GFRNONAA >60 12/07/2021   DM2 Diabetes Home sugar results  120 usually when leaving for work    DM diet -  Fair  Long work hours  More collard greens for lunch   Some birthdays and eating out in the past week  Ate pizza one day-unusual  Usually gets salad when eating out once per week   Exercise  Goes to gym 1-2 times per week  Cannot do more due to work schedule     Metformin  xr 1000 mg bid  Arb and statin     Lab Results  Component Value Date   HGBA1C 9.0 (A) 05/13/2024   HGBA1C 7.7 (A) 02/08/2024   HGBA1C 8.0 (A) 11/08/2023   Lab Results  Component Value Date   LABMICR See below: 10/10/2023   MICROALBUR <0.7 11/08/2023    Renal protection arb Last eye exam -due   No history of pancreatitis No gallstones  No thyroid  cancer      Hyperlipidemia  Lab Results  Component Value Date   CHOL 129 07/20/2023   HDL 40 07/20/2023   LDLCALC 70 07/20/2023   LDLDIRECT 115.6 11/17/2010   TRIG  100 07/20/2023   CHOLHDL 3.2 07/20/2023   Crestor  3 times weekly -some muscle pain    Patient Active Problem List   Diagnosis Date Noted   Long term current use of oral hypoglycemic drug 05/13/2024   Asthma 08/06/2023   Sleep apnea 08/06/2023   Encounter for hepatitis C screening test for low risk patient 08/06/2023   Encounter for screening for HIV 08/06/2023   Pseudopolyposis of colon without complication, unspecified part of colon (HCC) 10/09/2022   BPH (benign prostatic hyperplasia) 02/16/2021   Diabetes mellitus type 2, controlled (HCC) 03/11/2016   Prostate cancer screening 12/09/2014   Steatohepatitis, non-alcoholic 08/06/2013   Colon cancer screening 05/31/2012   Low serum testosterone level 11/28/2011   Routine general medical examination at a health care facility 11/23/2011   Family history of non-anemic vitamin B12 deficiency 05/22/2011   ED (erectile dysfunction) 05/26/2008   Class 1 obesity due to excess calories with serious comorbidity and body mass index (BMI) of 33.0 to 33.9 in adult 01/03/2007   Hyperlipidemia associated with type 2 diabetes mellitus (HCC) 01/01/2007  Essential hypertension 01/01/2007   Diverticulosis of colon 01/01/2007   Past Medical History:  Diagnosis Date   Allergy    mold,rag weed,   Asthma    Diverticulitis    colon    ED (erectile dysfunction)    Family history of non-anemic vitamin B12 deficiency 05/22/2011   Hyperlipidemia    Hypertension    Low testosterone    NASH (nonalcoholic steatohepatitis)    Obesity    Seasonal allergies    Sleep apnea    not used c pap in 6 month,updated 05/30/22   Past Surgical History:  Procedure Laterality Date   COLONOSCOPY     EXTRACORPOREAL SHOCK WAVE LITHOTRIPSY Left 12/22/2021   Procedure: EXTRACORPOREAL SHOCK WAVE LITHOTRIPSY (ESWL);  Surgeon: Kassie Ozell SAUNDERS, MD;  Location: [facility];  Service: Urology;  Laterality: Left;   EXTRACORPOREAL SHOCK WAVE LITHOTRIPSY Left 05/11/2022    Procedure: EXTRACORPOREAL SHOCK WAVE LITHOTRIPSY (ESWL);  Surgeon: Kassie Ozell SAUNDERS, MD;  Location: [facility];  Service: Urology;  Laterality: Left;   HERNIA REPAIR  07/17/1998   R & L   VASECTOMY  07/17/1997   Social History   Tobacco Use   Smoking status: Former    Types: Cigarettes    Passive exposure: Never   Smokeless tobacco: Never   Tobacco comments:    age 54 for 1 year   Vaping Use   Vaping status: Never Used  Substance Use Topics   Alcohol use: Yes    Comment: very rarely   Drug use: No   Family History  Problem Relation Age of Onset   Diabetes Mother    COPD Mother    Benign prostatic hyperplasia Father    Hypertension Father    Prostate cancer Father    Breast cancer Sister 14       died at 35   Prostate cancer Maternal Grandfather    Colon cancer Neg Hx    Esophageal cancer Neg Hx    Liver disease Neg Hx    Stomach cancer Neg Hx    Pancreatic cancer Neg Hx    Colon polyps Neg Hx    Crohn's disease Neg Hx    Rectal cancer Neg Hx    Ulcerative colitis Neg Hx    Allergies  Allergen Reactions   Milk (Cow) Other (See Comments)    Any Milk products...SABRARunny nose, cough   Viagra  [Sildenafil  Citrate]     Leg pain   Current Outpatient Medications on File Prior to Visit  Medication Sig Dispense Refill   Accu-Chek Softclix Lancets lancets Use to Check blood sugar twice a day (Dx E11.9) 100 each 2   albuterol  (VENTOLIN  HFA) 108 (90 Base) MCG/ACT inhaler Inhale 1-2 puffs into the lungs every 6 (six) hours as needed. 18 g 1   Blood Glucose Monitoring Suppl (ACCU-CHEK GUIDE) w/Device KIT Use to Check blood sugar twice a day (Dx E11.9) 1 kit 0   cetirizine (ZYRTEC) 10 MG tablet Take 10 mg by mouth daily as needed for allergies.     CIALIS  5 MG tablet TAKE 1 TABLET (5 MG TOTAL) BY MOUTH DAILY AS NEEDED FOR ERECTILE DYSFUNCTION. 10 tablet 5   EPIPEN  2-PAK 0.3 MG/0.3ML SOAJ injection Inject 0.3 mg into the muscle as needed for anaphylaxis. Reported on 07/06/2015-use as  needed 1 each 11   fluticasone (FLONASE) 50 MCG/ACT nasal spray Place 2 sprays into both nostrils daily as needed.   19   glucose blood (ACCU-CHEK GUIDE TEST) test strip USE TO CHECK  BLOOD SUGAR TWICE A DAY (DX E11.9) 200 strip 0   losartan  (COZAAR ) 100 MG tablet TAKE 1 TABLET BY MOUTH EVERY DAY 90 tablet 0   metFORMIN  (GLUCOPHAGE -XR) 500 MG 24 hr tablet Take 2 tablets (1,000 mg total) by mouth 2 (two) times daily with a meal. 360 tablet 1   Multiple Vitamin (DAILY MULTIVITAMIN PO) Take 1 tablet by mouth daily.     Omega-3 Fatty Acids (FISH OIL) 1200 MG CAPS Take by mouth daily.     rosuvastatin  (CRESTOR ) 5 MG tablet TAKE 1 TABLET BY MOUTH EVERY OTHER DAY (3 DAYS PER WEEK) 36 tablet 1   SYMBICORT 80-4.5 MCG/ACT inhaler Inhale 1 puff into the lungs daily as needed.     tamsulosin  (FLOMAX ) 0.4 MG CAPS capsule TAKE 1 CAPSULE BY MOUTH EVERY DAY 90 capsule 1   No current facility-administered medications on file prior to visit.    Review of Systems  Constitutional:  Positive for fatigue. Negative for activity change, appetite change, fever and unexpected weight change.  HENT:  Negative for congestion, rhinorrhea, sore throat and trouble swallowing.   Eyes:  Negative for pain, redness, itching and visual disturbance.  Respiratory:  Negative for cough, chest tightness, shortness of breath and wheezing.   Cardiovascular:  Negative for chest pain and palpitations.  Gastrointestinal:  Negative for abdominal pain, blood in stool, constipation, diarrhea and nausea.  Endocrine: Negative for cold intolerance, heat intolerance, polydipsia and polyuria.  Genitourinary:  Negative for difficulty urinating, dysuria, frequency and urgency.  Musculoskeletal:  Negative for arthralgias, joint swelling and myalgias.  Skin:  Negative for pallor and rash.  Neurological:  Negative for dizziness, tremors, weakness, numbness and headaches.  Hematological:  Negative for adenopathy. Does not bruise/bleed easily.   Psychiatric/Behavioral:  Negative for decreased concentration and dysphoric mood. The patient is not nervous/anxious.        Objective:   Physical Exam Constitutional:      General: He is not in acute distress.    Appearance: Normal appearance. He is well-developed. He is obese. He is not ill-appearing or diaphoretic.  HENT:     Head: Normocephalic and atraumatic.  Eyes:     Conjunctiva/sclera: Conjunctivae normal.     Pupils: Pupils are equal, round, and reactive to light.  Neck:     Thyroid : No thyromegaly.     Vascular: No carotid bruit or JVD.  Cardiovascular:     Rate and Rhythm: Normal rate and regular rhythm.     Heart sounds: Normal heart sounds.     No gallop.  Pulmonary:     Effort: Pulmonary effort is normal. No respiratory distress.     Breath sounds: Normal breath sounds. No wheezing or rales.  Abdominal:     General: There is no distension or abdominal bruit.     Palpations: Abdomen is soft.  Musculoskeletal:     Cervical back: Normal range of motion and neck supple.     Right lower leg: No edema.     Left lower leg: No edema.  Lymphadenopathy:     Cervical: No cervical adenopathy.  Skin:    General: Skin is warm and dry.     Coloration: Skin is not pale.     Findings: No rash.  Neurological:     Mental Status: He is alert.     Coordination: Coordination normal.     Deep Tendon Reflexes: Reflexes are normal and symmetric. Reflexes normal.  Psychiatric:        Mood and Affect:  Mood normal.           Assessment & Plan:   Problem List Items Addressed This Visit       Cardiovascular and Mediastinum   Essential hypertension   bp in fair control at this time  Back today after losing insurance for a while BP Readings from Last 1 Encounters:  05/13/24 110/72   No changes needed Most recent labs reviewed  Disc lifstyle change with low sodium diet and exercise  Plan to continue losartan  100 mg daily         Digestive   Steatohepatitis,  non-alcoholic   Stable liver tests No etoh or tylenol  Working hard on wt loss -commended  No symptoms Will continue to follow  Looking to see if GLP-1 would be covered for DM2 and obesity         Endocrine   Hyperlipidemia associated with type 2 diabetes mellitus (HCC)   Disc goals for lipids and reasons to control them Rev last labs with pt Rev low sat fat diet in detail  LDL of 70 and HDL slightly improvement Tolerates crestor  5 mg three times weekly with some muscle aches/ wants to continue   Will continue to monitor       Relevant Medications   tirzepatide [facility] [place]) 2.5 MG/0.5ML Pen   Diabetes mellitus type 2, controlled (HCC)   Lab Results  Component Value Date   HGBA1C 9.0 (A) 05/13/2024   HGBA1C 7.7 (A) 02/08/2024   HGBA1C 8.0 (A) 11/08/2023   Microalbumin utd  Up  Diet fair  Long work hours  Interested in glp-1 Sent mounjaro 2.5 mg weekly  Disc option of GLP medication including possible side effects like GI intolerance and risk of thyroid  and endocrine cancer, pancreatitis and gallstones, kidney problems and diabetic retinopathy Handout given   Will call when he gets medication Then hopes to get CGM sent in also and plan follow up   Reviewed low glycemic diet  Also goals for strength training (more important when on glp-1)          Relevant Medications   tirzepatide (MOUNJARO) 2.5 MG/0.5ML Pen     Other   Long term current use of oral hypoglycemic drug   Metformin   Hoping to add injectable glp-1 if covered soon       Class 1 obesity due to excess calories with serious comorbidity and body mass index (BMI) of 33.0 to 33.9 in adult   Bmi of 32.5  Central obesity  Out of control dm  Also fatty liver   Discussed how this problem influences overall health and the risks it imposes  Reviewed plan for weight loss with lower calorie diet (via better food choices (lower glycemic and portion control) along with exercise building up to or more than 30  minutes 5 days per week including some aerobic activity and strength training    Would benefit from glp-1 med for DM2, fatty liver and obesity        Relevant Medications   tirzepatide (MOUNJARO) 2.5 MG/0.5ML Pen   Other Visit Diagnoses       Controlled type 2 diabetes mellitus without complication, without long-term current use of insulin (HCC)    -  Primary   Relevant Medications   tirzepatide (MOUNJARO) 2.5 MG/0.5ML Pen   Other Relevant Orders   POCT HgB A1C (Completed)

## 2024-05-14 ENCOUNTER — Ambulatory Visit

## 2024-05-15 ENCOUNTER — Other Ambulatory Visit: Payer: Self-pay | Admitting: Family Medicine

## 2024-05-15 DIAGNOSIS — E119 Type 2 diabetes mellitus without complications: Secondary | ICD-10-CM

## 2024-05-15 DIAGNOSIS — I10 Essential (primary) hypertension: Secondary | ICD-10-CM

## 2024-06-04 ENCOUNTER — Other Ambulatory Visit: Payer: Self-pay | Admitting: Family Medicine

## 2024-06-06 NOTE — Telephone Encounter (Signed)
 How are you tolerating the medicine ?   Do you want to go up on the dose ?

## 2024-06-06 NOTE — Telephone Encounter (Signed)
 Pt is doing well on this does but would like to stay at this dose for at least another month or so. Pt said his blood sugars are doing well. His appetite is decreased and overall he is fine on this dose so wants to stay on it for a little while longer

## 2024-06-06 NOTE — Telephone Encounter (Signed)
 Last filled on 05/13/24 # 2 mL / 0 refill  Last OV was on 05/13/24

## 2024-06-19 ENCOUNTER — Encounter: Payer: Self-pay | Admitting: Urology

## 2024-07-09 ENCOUNTER — Other Ambulatory Visit: Payer: Self-pay | Admitting: Family Medicine

## 2024-07-11 NOTE — Telephone Encounter (Signed)
 How are you tolerating the medicine so far? Do you want to go up on the dose?

## 2024-07-11 NOTE — Telephone Encounter (Signed)
 Last filled on 06/06/24 #2 mL/ 0 refills   Last OV was on 05/13/24

## 2024-07-11 NOTE — Telephone Encounter (Signed)
 Called pt and he said he is still watching what he eats and his blood sugar has been averaging around 105 so he wants to stick with this does for a while

## 2024-07-30 ENCOUNTER — Other Ambulatory Visit: Payer: Self-pay | Admitting: Family Medicine

## 2024-07-31 ENCOUNTER — Other Ambulatory Visit (HOSPITAL_COMMUNITY): Payer: Self-pay

## 2024-08-10 ENCOUNTER — Other Ambulatory Visit: Payer: Self-pay | Admitting: Family Medicine

## 2024-08-10 DIAGNOSIS — I10 Essential (primary) hypertension: Secondary | ICD-10-CM

## 2024-08-12 ENCOUNTER — Other Ambulatory Visit: Payer: Self-pay | Admitting: Family Medicine

## 2024-08-17 ENCOUNTER — Other Ambulatory Visit: Payer: Self-pay | Admitting: Family Medicine

## 2024-08-17 DIAGNOSIS — E119 Type 2 diabetes mellitus without complications: Secondary | ICD-10-CM

## 2024-08-19 NOTE — Telephone Encounter (Signed)
 Last OV was on 05/13/24, no future appts., ? When pt will be due for his next DM f/u

## 2024-08-20 NOTE — Telephone Encounter (Signed)
 Called and schedule pt

## 2024-09-02 ENCOUNTER — Ambulatory Visit: Admitting: Family Medicine

## 2024-10-09 ENCOUNTER — Other Ambulatory Visit

## 2024-10-15 ENCOUNTER — Ambulatory Visit: Admitting: Urology

## 2024-10-16 ENCOUNTER — Ambulatory Visit: Admitting: Urology
